# Patient Record
Sex: Male | Born: 1985 | Race: Black or African American | Hispanic: No | Marital: Single | Smoking: Current every day smoker
Health system: Southern US, Community
[De-identification: ages and names within clinical notes are randomized; demographics above are authoritative.]

## PROBLEM LIST (undated history)

## (undated) DIAGNOSIS — I38 Endocarditis, valve unspecified: Secondary | ICD-10-CM

## (undated) DIAGNOSIS — F1110 Opioid abuse, uncomplicated: Secondary | ICD-10-CM

## (undated) DIAGNOSIS — F32A Depression, unspecified: Secondary | ICD-10-CM

## (undated) DIAGNOSIS — F419 Anxiety disorder, unspecified: Secondary | ICD-10-CM

## (undated) DIAGNOSIS — F319 Bipolar disorder, unspecified: Secondary | ICD-10-CM

## (undated) DIAGNOSIS — J45909 Unspecified asthma, uncomplicated: Secondary | ICD-10-CM

## (undated) DIAGNOSIS — F1410 Cocaine abuse, uncomplicated: Secondary | ICD-10-CM

## (undated) HISTORY — PX: NO PAST SURGERIES: SHX2092

---

## 2006-01-19 ENCOUNTER — Emergency Department (HOSPITAL_COMMUNITY): Admission: EM | Admit: 2006-01-19 | Discharge: 2006-01-19 | Payer: Self-pay | Admitting: Emergency Medicine

## 2006-06-06 ENCOUNTER — Emergency Department (HOSPITAL_COMMUNITY): Admission: EM | Admit: 2006-06-06 | Discharge: 2006-06-06 | Payer: Self-pay | Admitting: Emergency Medicine

## 2009-11-26 ENCOUNTER — Emergency Department (HOSPITAL_COMMUNITY): Admission: EM | Admit: 2009-11-26 | Discharge: 2009-11-26 | Payer: Self-pay | Admitting: Emergency Medicine

## 2019-02-13 ENCOUNTER — Encounter (HOSPITAL_COMMUNITY): Payer: Self-pay | Admitting: Emergency Medicine

## 2019-02-13 ENCOUNTER — Other Ambulatory Visit: Payer: Self-pay

## 2019-02-13 ENCOUNTER — Emergency Department (HOSPITAL_COMMUNITY)
Admission: EM | Admit: 2019-02-13 | Discharge: 2019-02-13 | Disposition: A | Discharge status: Home / Self Care | Payer: Self-pay | Attending: Emergency Medicine | Admitting: Emergency Medicine

## 2019-02-13 ENCOUNTER — Emergency Department (HOSPITAL_COMMUNITY): Payer: Self-pay

## 2019-02-13 DIAGNOSIS — R0602 Shortness of breath: Secondary | ICD-10-CM | POA: Insufficient documentation

## 2019-02-13 DIAGNOSIS — J45909 Unspecified asthma, uncomplicated: Secondary | ICD-10-CM | POA: Insufficient documentation

## 2019-02-13 HISTORY — DX: Unspecified asthma, uncomplicated: J45.909

## 2019-02-13 MED ORDER — ALBUTEROL SULFATE (2.5 MG/3ML) 0.083% IN NEBU
5.0000 mg | INHALATION_SOLUTION | Freq: Once | RESPIRATORY_TRACT | Status: AC
  Administered 2019-02-13: 5 mg via RESPIRATORY_TRACT
  Filled 2019-02-13: qty 6

## 2019-02-13 NOTE — ED Notes (Signed)
Pt on oxygen for comfort.  Without oxygen saturation 100% RA

## 2019-02-13 NOTE — ED Provider Notes (Signed)
Emergency Department Provider Note   I have reviewed the triage vital signs and the nursing notes.   HISTORY  Chief Complaint Shortness of Breath   HPI Nathaniel Hansen is a 33 y.o. male with reported PMH of asthma presents to the emergency department with shortness of breath.  He denies any fevers, chills, cough, travel history.  No sick contacts.  Patient tells me he has asthma but does not use nebulizer treatments or inhaler.  He denies any chest pain.  Shortness of breath symptoms began this morning.  He denies any anxiety symptoms.  No radiation of symptoms or other modifying factors.  Past Medical History:  Diagnosis Date  . Asthma     There are no active problems to display for this patient.   History reviewed. No pertinent surgical history.    Allergies Patient has no known allergies.  History reviewed. No pertinent family history.  Social History Social History   Tobacco Use  . Smoking status: Never Smoker  . Smokeless tobacco: Never Used  Substance Use Topics  . Alcohol use: Yes    Frequency: Never  . Drug use: Never    Review of Systems  Constitutional: No fever/chills Eyes: No visual changes. ENT: No sore throat. Cardiovascular: Denies chest pain. Respiratory: Positive shortness of breath. Gastrointestinal: No abdominal pain.  No nausea, no vomiting.  No diarrhea.  No constipation. Genitourinary: Negative for dysuria. Musculoskeletal: Negative for back pain. Skin: Negative for rash. Neurological: Negative for headaches, focal weakness or numbness.  10-point ROS otherwise negative.  ____________________________________________   PHYSICAL EXAM:  VITAL SIGNS: ED Triage Vitals  Enc Vitals Group     BP 02/13/19 2214 (!) 142/89     Pulse Rate 02/13/19 2214 (!) 120     Resp 02/13/19 2214 (!) 25     Temp 02/13/19 2214 98.3 F (36.8 C)     Temp Source 02/13/19 2214 Oral     SpO2 02/13/19 2214 100 %     Weight 02/13/19 2212 170 lb  (77.1 kg)     Height 02/13/19 2212 5\' 11"  (1.803 m)     Pain Score 02/13/19 2211 0   Constitutional: Alert and oriented. Irregular breathing pattern. Will take several exaggerated, deep breaths and then breathe normally.  Eyes: Conjunctivae are normal.  Head: Atraumatic. Nose: No congestion/rhinnorhea. Mouth/Throat: Mucous membranes are moist.  Neck: No stridor. Cardiovascular: Tachycardia. Good peripheral circulation. Grossly normal heart sounds.   Respiratory: Intermittent increased respiratory effort.  No retractions. Lungs CTAB. Gastrointestinal: Soft and nontender. No distention.  Musculoskeletal: No lower extremity tenderness nor edema. No gross deformities of extremities. Neurologic:  Normal speech and language. No gross focal neurologic deficits are appreciated.  Skin:  Skin is warm, dry and intact. No rash noted. Psychiatric: Bizarre affect. Not responding to internal stimuli.  ____________________________________________  EKG   EKG Interpretation  Date/Time:  Wednesday February 13 2019 22:14:47 EDT Ventricular Rate:  113 PR Interval:    QRS Duration: 101 QT Interval:  334 QTC Calculation: 462 R Axis:   -117 Text Interpretation:  Sinus tachycardia Biatrial enlargement Right superior axis Probable right ventricular hypertrophy No STEMI.  Confirmed by Alona Bene [phone]) on 02/13/2019 10:41:34 PM      ____________________________________________  RADIOLOGY  Patient declined ____________________________________________   PROCEDURES  Procedure(s) performed:   Procedures  None  ____________________________________________   INITIAL IMPRESSION / ASSESSMENT AND PLAN / ED COURSE  Pertinent labs & imaging results that were available during my care of the patient  were reviewed by me and considered in my medical decision making (see chart for details).  Patient arrives to the emergency department with a somewhat bizarre presentation.  He has strange affect.  He does  not appear acutely psychotic or aggressive.  He has intermittent deep, labored breathing but will then return to normal breathing to provide his history.  He then returns to deep breathing.  I did not appreciate any wheezing.  He reports a history of asthma but then later denies any history of asthma.  He is refusing chest x-ray.  We were able to obtain an EKG which shows sinus tachycardia.  He refuses any blood tests.  He denies any chest pain.  My suspicion for PE is low.  No ischemic changes on EKG.  Patient agrees plan for nebulizer treatment and reassess.  10:30 PM  Patient is asking to leave.  States he feels better.  He received only partial breathing treatment.  He is having even, unlabored respirations.  Normal vital signs including oxygen saturation.  He is standing at the nursing station and signing to leave.  Discussed return precautions to the emergency department there.  Patient leaving the ED with normal gait and unlabored respirations.   ____________________________________________  FINAL CLINICAL IMPRESSION(S) / ED DIAGNOSES  Final diagnoses:  SOB (shortness of breath)    MEDICATIONS GIVEN DURING THIS VISIT:  Medications  albuterol (PROVENTIL) (2.5 MG/3ML) 0.083% nebulizer solution 5 mg (5 mg Nebulization Given 02/13/19 2222)    Note:  This document was prepared using Dragon voice recognition software and may include unintentional dictation errors.  Alona Bene, MD Emergency Medicine    Long, Arlyss Repress, MD 02/13/19 [phone]

## 2019-02-13 NOTE — ED Notes (Signed)
Pt refusing to wear oxygen or surgical masks.

## 2019-02-13 NOTE — ED Notes (Signed)
Pt pulled everything off and said that he that he leaving. RN explained that the MD has not discharged him. He stated that all he needed was a breathing treatment and that he is now good to go.

## 2019-02-13 NOTE — Discharge Instructions (Signed)
You are leaving against medical advice. You are welcome to return at any time if you would like evaluation or treatment.

## 2019-02-13 NOTE — ED Triage Notes (Signed)
Patient is asthmatic having shortness of breath since this am, not taking nebulizer treatments or inhaler.

## 2019-02-14 ENCOUNTER — Encounter (HOSPITAL_COMMUNITY): Payer: Self-pay

## 2019-02-14 ENCOUNTER — Emergency Department (HOSPITAL_COMMUNITY)
Admission: EM | Admit: 2019-02-14 | Discharge: 2019-02-14 | Disposition: A | Discharge status: Home / Self Care | Payer: Self-pay | Attending: Emergency Medicine | Admitting: Emergency Medicine

## 2019-02-14 ENCOUNTER — Other Ambulatory Visit: Payer: Self-pay

## 2019-02-14 DIAGNOSIS — F1010 Alcohol abuse, uncomplicated: Secondary | ICD-10-CM | POA: Insufficient documentation

## 2019-02-14 DIAGNOSIS — R0602 Shortness of breath: Secondary | ICD-10-CM | POA: Insufficient documentation

## 2019-02-14 DIAGNOSIS — J45909 Unspecified asthma, uncomplicated: Secondary | ICD-10-CM | POA: Insufficient documentation

## 2019-02-14 MED ORDER — CLONAZEPAM 0.5 MG PO TABS
0.5000 mg | ORAL_TABLET | Freq: Once | ORAL | Status: AC
  Administered 2019-02-14: 0.5 mg via ORAL
  Filled 2019-02-14: qty 1

## 2019-02-14 MED ORDER — ALBUTEROL SULFATE (2.5 MG/3ML) 0.083% IN NEBU
5.0000 mg | INHALATION_SOLUTION | Freq: Once | RESPIRATORY_TRACT | Status: AC
  Administered 2019-02-14: 5 mg via RESPIRATORY_TRACT
  Filled 2019-02-14: qty 6

## 2019-02-14 NOTE — ED Notes (Addendum)
Pt asked this nurse, "hey do you got a "roxy", get me a "roxy". This nurse replied no and I will not.  Dr Lynelle Doctor made aware of this.

## 2019-02-14 NOTE — ED Provider Notes (Signed)
[facility] EMERGENCY DEPARTMENT Provider Note   CSN: [id number] Arrival date & time: 02/14/19  0155  Time seen 2:05 AM  History   Chief Complaint Chief Complaint  Patient presents with  . Shortness of Breath    HPI Nathaniel Hansen is a 33 y.o. male.     HPI patient presents via EMS after leaving the ED about 10:30 PM.  Patient states Nathaniel Hansen has had trouble breathing since Nathaniel Hansen was a child.  Initially told me Nathaniel Hansen never been to the ED for his breathing then Nathaniel Hansen told me Nathaniel Hansen had.  Nathaniel Hansen states Nathaniel Hansen is never had to be admitted and Nathaniel Hansen did not know what steroids were.  Nathaniel Hansen states yesterday morning and then this morning Nathaniel Hansen is having a lot of trouble breathing.  Nathaniel Hansen denies cough, sore throat, rhinorrhea, sneezing, fever, vomiting, or diarrhea.  Nathaniel Hansen states the last episode prior to tonight was a year and a half ago.  Nathaniel Hansen states nothing Nathaniel Hansen is does makes the breathing difficulty worse, but drinking a lot of water makes it get better.  Nathaniel Hansen states "water has a lot of oxygen in it".  Nathaniel Hansen denies palpitations or feeling his heart is beating hard or racing.  Patient was seen earlier this afternoon and left after getting one nebulizer treatment.   PCP Patient, No Pcp Per   Past Medical History:  Diagnosis Date  . Asthma     There are no active problems to display for this patient.   History reviewed. No pertinent surgical history.      Home Medications    Prior to Admission medications   Not on File    Family History No family history on file.  Social History Social History   Tobacco Use  . Smoking status: Never Smoker  . Smokeless tobacco: Never Used  Substance Use Topics  . Alcohol use: Yes    Frequency: Never  . Drug use: Never  unemployed States Nathaniel Hansen is living friend to friend Smokes 1 ppd Denies drug use States drinks "occasionally".   Allergies   Patient has no known allergies.   Review of Systems Review of Systems  All other systems reviewed and are negative.     Physical Exam Updated Vital Signs BP 130/74   Pulse (!) 115   Temp 98.3 F (36.8 C) (Oral)   Resp (!) 33   Ht 5\' 11"  (1.803 m)   Wt 77.1 kg   SpO2 100%   BMI 23.71 kg/m   Physical Exam Vitals signs and nursing note reviewed.  Constitutional:      General: Nathaniel Hansen is not in acute distress.    Appearance: Nathaniel Hansen is well-developed.  HENT:     Head: Normocephalic and atraumatic.     Right Ear: External ear normal.     Left Ear: External ear normal.     Nose: Nose normal.     Mouth/Throat:     Mouth: Mucous membranes are moist.     Pharynx: No pharyngeal swelling, oropharyngeal exudate or posterior oropharyngeal erythema.  Eyes:     Extraocular Movements: Extraocular movements intact.     Conjunctiva/sclera: Conjunctivae normal.     Pupils: Pupils are equal, round, and reactive to light.  Neck:     Musculoskeletal: Normal range of motion.  Cardiovascular:     Rate and Rhythm: Regular rhythm. Tachycardia present.     Pulses: Normal pulses.  Pulmonary:     Effort: Tachypnea present.     Breath sounds: Normal breath sounds. No  decreased breath sounds, wheezing or rhonchi.     Comments: I had patient sit up to listen to his lungs and Nathaniel Hansen flopped back down on the bed before I was done.  When I asked him what happened Nathaniel Hansen states "I am tired". Musculoskeletal: Normal range of motion.  Skin:    General: Skin is warm and dry.  Neurological:     General: No focal deficit present.     Mental Status: Nathaniel Hansen is alert and oriented to person, place, and time.     Cranial Nerves: No cranial nerve deficit.  Psychiatric:        Mood and Affect: Mood is anxious.        Speech: Speech is rapid and pressured.        Behavior: Behavior is agitated.      ED Treatments / Results  Labs (all labs ordered are listed, but only abnormal results are displayed) Labs Reviewed - No data to display  EKG None  Radiology No results found.  Procedures Procedures (including critical care time)   Medications Ordered in ED Medications  albuterol (PROVENTIL) (2.5 MG/3ML) 0.083% nebulizer solution 5 mg (5 mg Nebulization Given 02/14/19 0226)  clonazePAM (KLONOPIN) tablet 0.5 mg (0.5 mg Oral Given 02/14/19 0248)     Initial Impression / Assessment and Plan / ED Course  I have reviewed the triage vital signs and the nursing notes.  Pertinent labs & imaging results that were available during my care of the patient were reviewed by me and considered in my medical decision making (see chart for details).    Patient was given an albuterol nebulizer only because Nathaniel Hansen requested it.  Patient is refusing to get a chest x-ray, Nathaniel Hansen will not say why.  Nurse reports his mother called and states Nathaniel Hansen has been living with them for the past 2 years.  She reports Nathaniel Hansen drinks a lot of alcohol daily and Nathaniel Hansen has been trying to detox himself without going to rehab.  This may explain some of his anxiety.  CIWA score was done by nursing staff and is 7 so Nathaniel Hansen is having mild withdrawal symptoms.  Nurse reports patient is refusing to take Ativan but states Nathaniel Hansen will take Klonopin.  Nathaniel Hansen states Nathaniel Hansen has had it before but will not say why except Nathaniel Hansen has some anxiety.  I am wondering if Nathaniel Hansen is having a flareup of his anxiety with the current coronavirus scare.    Recheck at 3:20 AM patient has had his nebulizer.  Nathaniel Hansen states Nathaniel Hansen is feeling better.  His affect is just not right.  I still think Nathaniel Hansen may be doing some type of street drug.  Nathaniel Hansen also is withdrawing from alcohol but Nathaniel Hansen just does not having a normal affect.  Nathaniel Hansen is unemployed and living with his mother or "friend a friend".  Nathaniel Hansen may have some underlying mental health issues.  Nathaniel Hansen however is not expressing suicidal or homicidal ideation.  Patient was not given an albuterol inhaler, I am afraid Nathaniel Hansen will miss use it.  Final Clinical Impressions(s) / ED Diagnoses   Final diagnoses:  Shortness of breath  Alcohol abuse    ED Discharge Orders    None     Plan discharge  Devoria Albe,  MD, Concha Pyo, MD 02/14/19 [phone]

## 2019-02-14 NOTE — ED Notes (Signed)
Pt mother called this nurse to inform that pt has been "trying to quit drinking" for the past couple of days by himself without going to rehab, mom expresses concerns about pt possibly having withdrawals Dr Lynelle Doctor made aware of this.

## 2019-02-14 NOTE — Discharge Instructions (Signed)
Your oxygen level is perfect at 100%.  Recheck if you get a fever, cough, or you feel like you are worse again.  Consider going to [facility] to get help with your alcohol problem.  They can also help you with your anxiety.

## 2019-02-14 NOTE — ED Triage Notes (Signed)
Pt brought in by EMS for c/o shortness of breath. Pt was seen earlier for the same and given a neb treatment, Pt wanted to leave afterwards and the EDP was notified, xray that was ordered was not performed and pt was given discharge papers. Pt here at this time c/o shortness of breath. Pt is 100% on room air, no wheezing, with increased respirations.

## 2019-02-15 ENCOUNTER — Other Ambulatory Visit: Payer: Self-pay

## 2019-02-15 ENCOUNTER — Emergency Department (HOSPITAL_COMMUNITY)
Admission: EM | Admit: 2019-02-15 | Discharge: 2019-02-15 | Disposition: A | Discharge status: Home / Self Care | Payer: Self-pay | Attending: Emergency Medicine | Admitting: Emergency Medicine

## 2019-02-15 ENCOUNTER — Encounter (HOSPITAL_COMMUNITY): Payer: Self-pay | Admitting: *Deleted

## 2019-02-15 DIAGNOSIS — J45901 Unspecified asthma with (acute) exacerbation: Secondary | ICD-10-CM

## 2019-02-15 DIAGNOSIS — J4521 Mild intermittent asthma with (acute) exacerbation: Secondary | ICD-10-CM | POA: Insufficient documentation

## 2019-02-15 MED ORDER — ALBUTEROL SULFATE HFA 108 (90 BASE) MCG/ACT IN AERS
2.0000 | INHALATION_SPRAY | Freq: Once | RESPIRATORY_TRACT | Status: AC
  Administered 2019-02-15: 2 via RESPIRATORY_TRACT
  Filled 2019-02-15: qty 6.7

## 2019-02-15 NOTE — ED Notes (Signed)
Pt escorted out with security and rpd.

## 2019-02-15 NOTE — ED Notes (Signed)
edp in room  

## 2019-02-15 NOTE — ED Notes (Signed)
Pt unwilling to leave. Security and rpd to bedside

## 2019-02-15 NOTE — ED Triage Notes (Signed)
C/o sob that started a few days ago, denies any cough but states " I have some phlegm in my throat", admits to a 'few fevers"  Denies any chills, was seen in er for the past two days for same,

## 2019-02-15 NOTE — ED Notes (Signed)
EDP to room to rediscuss d/c

## 2019-02-15 NOTE — Discharge Instructions (Signed)
Stop smoking.  Use inhaler as directed

## 2019-02-15 NOTE — ED Notes (Signed)
RN went over pt discharge instructions. Pt unwilling to leave at this time wants PA to come check on him again.

## 2019-02-15 NOTE — ED Provider Notes (Signed)
[facility] EMERGENCY DEPARTMENT Provider Note   CSN: [id number] Arrival date & time: 02/15/19  2012    History   Chief Complaint Chief Complaint  Patient presents with  . Shortness of Breath    HPI Nathaniel Hansen is a 33 y.o. male.     The history is provided by the patient. No language interpreter was used.  Shortness of Breath  Severity:  Mild Onset quality:  Gradual Timing:  Constant Progression:  Unchanged Chronicity:  Recurrent Relieved by:  Nothing Worsened by:  Nothing Ineffective treatments:  None tried Associated symptoms: cough   Risk factors: tobacco use   Pt reports he feels short of breath on and off. Pt has been here the last 2 days.   Past Medical History:  Diagnosis Date  . Asthma     There are no active problems to display for this patient.   History reviewed. No pertinent surgical history.      Home Medications    Prior to Admission medications   Not on File    Family History No family history on file.  Social History Social History   Tobacco Use  . Smoking status: Never Smoker  . Smokeless tobacco: Never Used  Substance Use Topics  . Alcohol use: Yes    Frequency: Never  . Drug use: Never     Allergies   Patient has no known allergies.   Review of Systems Review of Systems  Respiratory: Positive for cough and shortness of breath.   All other systems reviewed and are negative.    Physical Exam Updated Vital Signs BP 111/68 (BP Location: Right Arm)   Pulse (!) 120   Temp 98.3 F (36.8 C) (Oral)   Resp 14   SpO2 98%   Physical Exam Vitals signs and nursing note reviewed.  Constitutional:      Appearance: He is well-developed.  HENT:     Head: Normocephalic and atraumatic.  Eyes:     Conjunctiva/sclera: Conjunctivae normal.  Neck:     Musculoskeletal: Neck supple.  Cardiovascular:     Rate and Rhythm: Normal rate and regular rhythm.     Heart sounds: No murmur.  Pulmonary:     Effort:  Pulmonary effort is normal. No respiratory distress.     Breath sounds: Examination of the right-lower field reveals wheezing. Examination of the left-lower field reveals wheezing. Wheezing present.  Chest:     Chest wall: No tenderness.  Abdominal:     Palpations: Abdomen is soft.     Tenderness: There is no abdominal tenderness.  Skin:    General: Skin is warm and dry.  Neurological:     General: No focal deficit present.     Mental Status: He is alert.  Psychiatric:        Mood and Affect: Mood normal.      ED Treatments / Results  Labs (all labs ordered are listed, but only abnormal results are displayed) Labs Reviewed - No data to display  EKG EKG Interpretation  Date/Time:  Friday February 15 2019 20:26:16 EDT Ventricular Rate:  106 PR Interval:    QRS Duration: 103 QT Interval:  339 QTC Calculation: 451 R Axis:   92 Text Interpretation:  Sinus tachycardia Biatrial enlargement Consider right ventricular hypertrophy Confirmed by Vanetta Mulders [phone]) on 02/15/2019 8:47:38 PM   Radiology No results found.  Procedures Procedures (including critical care time)  Medications Ordered in ED Medications  albuterol (PROVENTIL HFA;VENTOLIN HFA) 108 (90 Base) MCG/ACT inhaler  2 puff (2 puffs Inhalation Given 02/15/19 2134)     Initial Impression / Assessment and Plan / ED Course  I have reviewed the triage vital signs and the nursing notes.  Pertinent labs & imaging results that were available during my care of the patient were reviewed by me and considered in my medical decision making (see chart for details).        MDM  RN attempted to give 2 puffs albuterol. Pt stated he could not use.  Pt refused blood work.  Pt refused chest xray.   I reexamined pt due to this being his 3rd visit.  Pt seems well.  I advised pt to stay home.  He states his Mother keeps dropping him off at the hospital. He states his Mother does not want him at home if he is sick.   Final  Clinical Impressions(s) / ED Diagnoses   Final diagnoses:  Mild asthma with exacerbation, unspecified whether persistent    ED Discharge Orders    None    Albuterol inhaler   Elson Areas, PA-C 02/15/19 2255    Vanetta Mulders, MD 02/27/19 [phone]

## 2019-06-06 ENCOUNTER — Emergency Department (HOSPITAL_COMMUNITY)
Admission: EM | Admit: 2019-06-06 | Discharge: 2019-06-06 | Payer: Self-pay | Attending: Emergency Medicine | Admitting: Emergency Medicine

## 2019-06-06 ENCOUNTER — Encounter (HOSPITAL_COMMUNITY): Payer: Self-pay

## 2019-06-06 ENCOUNTER — Other Ambulatory Visit: Payer: Self-pay

## 2019-06-06 DIAGNOSIS — F17210 Nicotine dependence, cigarettes, uncomplicated: Secondary | ICD-10-CM | POA: Insufficient documentation

## 2019-06-06 DIAGNOSIS — Z1389 Encounter for screening for other disorder: Secondary | ICD-10-CM | POA: Insufficient documentation

## 2019-06-06 DIAGNOSIS — J45909 Unspecified asthma, uncomplicated: Secondary | ICD-10-CM | POA: Insufficient documentation

## 2019-06-06 LAB — RAPID HIV SCREEN (HIV 1/2 AB+AG)
HIV 1/2 Antibodies: NONREACTIVE
HIV-1 P24 Antigen - HIV24: NONREACTIVE

## 2019-06-06 NOTE — ED Provider Notes (Signed)
[facility] EMERGENCY DEPARTMENT Provider Note   CSN: [id number] Arrival date & time: 06/06/19  0431    History   Chief Complaint Chief Complaint  Patient presents with  . Labs Only    HPI NEO YEPIZ is a 33 y.o. male.     Patient presents to the emergency department in custody of police.  One of the officers that was involved in his apprehension earlier was accidentally stuck with a needle that was in the patient's presence.  Patient denies history of HIV and hepatitis.  He has brought here by police for post exposure lab draw.  Patient reports that he gives consent for blood testing for communicable diseases.     Past Medical History:  Diagnosis Date  . Asthma     There are no active problems to display for this patient.   History reviewed. No pertinent surgical history.      Home Medications    Prior to Admission medications   Not on File    Family History No family history on file.  Social History Social History   Tobacco Use  . Smoking status: Current Every Day Smoker    Packs/day: 1.00    Types: Cigarettes  . Smokeless tobacco: Never Used  Substance Use Topics  . Alcohol use: Yes    Frequency: Never  . Drug use: Never    Comment: "sometimes"     Allergies   Patient has no known allergies.   Review of Systems Review of Systems  Constitutional: Negative for fever.  Skin: Negative for rash.     Physical Exam Updated Vital Signs BP 140/75 (BP Location: Right Arm)   Pulse 90   Temp 98.4 F (36.9 C) (Oral)   Resp 18   Ht 6' (1.829 m)   Wt 77.1 kg   SpO2 100%   BMI 23.06 kg/m   Physical Exam Constitutional:      Appearance: Normal appearance.  Pulmonary:     Effort: Pulmonary effort is normal.  Musculoskeletal: Normal range of motion.  Skin:    General: Skin is warm and dry.     Findings: No rash.  Neurological:     General: No focal deficit present.     Mental Status: He is alert and oriented to person, place,  and time.  Psychiatric:        Mood and Affect: Mood normal.        Behavior: Behavior normal.      ED Treatments / Results  Labs (all labs ordered are listed, but only abnormal results are displayed) Labs Reviewed  RAPID HIV SCREEN (HIV 1/2 AB+AG)  HEPATITIS PANEL, ACUTE    EKG None  Radiology No results found.  Procedures Procedures (including critical care time)  Medications Ordered in ED Medications - No data to display   Initial Impression / Assessment and Plan / ED Course  I have reviewed the triage vital signs and the nursing notes.  Pertinent labs & imaging results that were available during my care of the patient were reviewed by me and considered in my medical decision making (see chart for details).        Patient brought to the emergency department by police for blood draw.  Patient was apprehended earlier tonight and during a search, 1 of the officers accidentally was stuck on his right pinky finger by a used needle that was in the possession of the patient.  Patient denies any history of HIV or hepatitis.  He consented  for blood draw.  Postexposure panel was drawn.  Final Clinical Impressions(s) / ED Diagnoses   Final diagnoses:  Encounter for screening for other disorder    ED Discharge Orders    None       Maylyn Narvaiz, Canary Brimhristopher J, MD 06/06/19 [phone]

## 2019-06-06 NOTE — ED Triage Notes (Signed)
[employer] brought pt into ED in custody to have blood drawn as officer was exposed to needle that pt had used.

## 2019-06-07 LAB — HEPATITIS PANEL, ACUTE
HCV Ab: <0.1 s/co ratio (ref 0.0–0.9)
Hep A IgM: NEGATIVE
Hep B C IgM: NEGATIVE
Hepatitis B Surface Ag: NEGATIVE

## 2019-06-19 ENCOUNTER — Other Ambulatory Visit: Payer: Self-pay

## 2019-06-19 ENCOUNTER — Emergency Department (HOSPITAL_COMMUNITY): Payer: Self-pay

## 2019-06-19 ENCOUNTER — Encounter (HOSPITAL_COMMUNITY): Payer: Self-pay | Admitting: Emergency Medicine

## 2019-06-19 ENCOUNTER — Emergency Department (HOSPITAL_COMMUNITY)
Admission: EM | Admit: 2019-06-19 | Discharge: 2019-06-19 | Disposition: A | Discharge status: Home / Self Care | Payer: Self-pay | Attending: Emergency Medicine | Admitting: Emergency Medicine

## 2019-06-19 DIAGNOSIS — R55 Syncope and collapse: Secondary | ICD-10-CM | POA: Insufficient documentation

## 2019-06-19 DIAGNOSIS — F1910 Other psychoactive substance abuse, uncomplicated: Secondary | ICD-10-CM | POA: Insufficient documentation

## 2019-06-19 DIAGNOSIS — J45909 Unspecified asthma, uncomplicated: Secondary | ICD-10-CM | POA: Insufficient documentation

## 2019-06-19 DIAGNOSIS — R0602 Shortness of breath: Secondary | ICD-10-CM | POA: Insufficient documentation

## 2019-06-19 DIAGNOSIS — F17210 Nicotine dependence, cigarettes, uncomplicated: Secondary | ICD-10-CM | POA: Insufficient documentation

## 2019-06-19 LAB — CBC WITH DIFFERENTIAL/PLATELET
Abs Immature Granulocytes: 0.01 10*3/uL (ref 0.00–0.07)
Basophils Absolute: 0 10*3/uL (ref 0.0–0.1)
Basophils Relative: 1 %
Eosinophils Absolute: 0 10*3/uL (ref 0.0–0.5)
Eosinophils Relative: 1 %
HCT: 32.4 % — ABNORMAL LOW (ref 39.0–52.0)
Hemoglobin: 10.4 g/dL — ABNORMAL LOW (ref 13.0–17.0)
Immature Granulocytes: 0 %
Lymphocytes Relative: 28 %
Lymphs Abs: 0.9 10*3/uL (ref 0.7–4.0)
MCH: 28 pg (ref 26.0–34.0)
MCHC: 32.1 g/dL (ref 30.0–36.0)
MCV: 87.3 fL (ref 80.0–100.0)
Monocytes Absolute: 0.5 10*3/uL (ref 0.1–1.0)
Monocytes Relative: 16 %
Neutro Abs: 1.7 10*3/uL (ref 1.7–7.7)
Neutrophils Relative %: 54 %
Platelets: 247 10*3/uL (ref 150–400)
RBC: 3.71 MIL/uL — ABNORMAL LOW (ref 4.22–5.81)
RDW: 13.6 % (ref 11.5–15.5)
WBC: 3.2 10*3/uL — ABNORMAL LOW (ref 4.0–10.5)
nRBC: 0 % (ref 0.0–0.2)

## 2019-06-19 LAB — RAPID URINE DRUG SCREEN, HOSP PERFORMED
Amphetamines: POSITIVE — AB
Barbiturates: NOT DETECTED
Benzodiazepines: POSITIVE — AB
Cocaine: NOT DETECTED
Opiates: NOT DETECTED
Tetrahydrocannabinol: POSITIVE — AB

## 2019-06-19 LAB — TROPONIN I (HIGH SENSITIVITY)
Troponin I (High Sensitivity): <2 ng/L (ref <18)
Troponin I (High Sensitivity): <2 ng/L (ref <18)

## 2019-06-19 LAB — URINALYSIS, ROUTINE W REFLEX MICROSCOPIC
Bilirubin Urine: NEGATIVE
Glucose, UA: NEGATIVE mg/dL
Ketones, ur: NEGATIVE mg/dL
Leukocytes,Ua: NEGATIVE
Nitrite: NEGATIVE
Protein, ur: NEGATIVE mg/dL
Specific Gravity, Urine: 1.016 (ref 1.005–1.030)
pH: 5 (ref 5.0–8.0)

## 2019-06-19 LAB — HEPATIC FUNCTION PANEL
ALT: 15 U/L (ref 0–44)
AST: 19 U/L (ref 15–41)
Albumin: 4.1 g/dL (ref 3.5–5.0)
Alkaline Phosphatase: 55 U/L (ref 38–126)
Bilirubin, Direct: <0.1 mg/dL (ref 0.0–0.2)
Total Bilirubin: 0.4 mg/dL (ref 0.3–1.2)
Total Protein: 7.9 g/dL (ref 6.5–8.1)

## 2019-06-19 LAB — BASIC METABOLIC PANEL
Anion gap: 10 (ref 5–15)
BUN: 15 mg/dL (ref 6–20)
CO2: 25 mmol/L (ref 22–32)
Calcium: 9.2 mg/dL (ref 8.9–10.3)
Chloride: 103 mmol/L (ref 98–111)
Creatinine, Ser: 0.99 mg/dL (ref 0.61–1.24)
GFR calc Af Amer: >60 mL/min (ref >60)
GFR calc non Af Amer: >60 mL/min (ref >60)
Glucose, Bld: 104 mg/dL — ABNORMAL HIGH (ref 70–99)
Potassium: 3.6 mmol/L (ref 3.5–5.1)
Sodium: 138 mmol/L (ref 135–145)

## 2019-06-19 LAB — AMMONIA: Ammonia: 35 umol/L (ref 9–35)

## 2019-06-19 LAB — D-DIMER, QUANTITATIVE: D-Dimer, Quant: 0.52 ug/mL-FEU — ABNORMAL HIGH (ref 0.00–0.50)

## 2019-06-19 LAB — ETHANOL: Alcohol, Ethyl (B): <10 mg/dL (ref <10)

## 2019-06-19 MED ORDER — IOHEXOL 350 MG/ML SOLN
100.0000 mL | Freq: Once | INTRAVENOUS | Status: AC | PRN
  Administered 2019-06-19: 100 mL via INTRAVENOUS

## 2019-06-19 MED ORDER — SODIUM CHLORIDE 0.9 % IV BOLUS
1000.0000 mL | Freq: Once | INTRAVENOUS | Status: AC
  Administered 2019-06-19: 1000 mL via INTRAVENOUS

## 2019-06-19 NOTE — ED Provider Notes (Signed)
[facility] EMERGENCY DEPARTMENT Provider Note   CSN: [id number] Arrival date & time: 06/19/19  0505     History   Chief Complaint Chief Complaint  Patient presents with  . Shortness of Breath    HPI Nathaniel Hansen is a 33 y.o. male.     Level 5 caveat for uncooperative.  Patient brought in by EMS for reported shortness of breath and lightheadedness.  Patient remains asleep but is not very cooperative with answering questions.  He does wake up with encouragement thought and Patient is alert and oriented to person and place.  Denies using any drugs tonight.  States he was sitting on his couch smoking when all of a sudden he became short of breath and lightheaded.  He states he felt like he was going to pass out but did not actually.  His chest felt tight.  There is no cough or fever.  He did not fall or hit his head.  Admits to smoking cigarettes but no other drugs.  He is concerned he could be dehydrated and is felt lightheaded but did not actually pass out or hit his head.  He complains of gradual onset headache, chest tightness and trouble breathing only saturating 100% on room air.  No known coronavirus or other ill exposures. Denies thunderclap onset headache.  The history is provided by the patient and the EMS personnel. The history is limited by the condition of the patient.  Shortness of Breath Associated symptoms: no abdominal pain, no chest pain, no fever, no headaches, no rash and no vomiting     Past Medical History:  Diagnosis Date  . Asthma     There are no active problems to display for this patient.   History reviewed. No pertinent surgical history.      Home Medications    Prior to Admission medications   Not on File    Family History History reviewed. No pertinent family history.  Social History Social History   Tobacco Use  . Smoking status: Current Every Day Smoker    Packs/day: 1.00    Types: Cigarettes  . Smokeless tobacco: Never Used   Substance Use Topics  . Alcohol use: Yes    Frequency: Never  . Drug use: Never    Comment: "sometimes"     Allergies   Patient has no known allergies.   Review of Systems Review of Systems  Constitutional: Negative for activity change, appetite change and fever.  HENT: Negative for congestion and rhinorrhea.   Respiratory: Positive for chest tightness and shortness of breath.   Cardiovascular: Negative for chest pain.  Gastrointestinal: Negative for abdominal pain, nausea and vomiting.  Genitourinary: Negative for dysuria and hematuria.  Musculoskeletal: Negative for back pain and myalgias.  Skin: Negative for rash.  Neurological: Negative for dizziness, weakness and headaches.    all other systems are negative except as noted in the HPI and PMH.    Physical Exam Updated Vital Signs BP 124/87 (BP Location: Left Arm)   Pulse 78   Temp 98.1 F (36.7 C) (Oral)   Resp 16   Ht 6' (1.829 m)   Wt 77.1 kg   SpO2 99%   BMI 23.06 kg/m   Physical Exam Vitals signs and nursing note reviewed.  Constitutional:      General: He is not in acute distress.    Appearance: He is well-developed.     Comments: Somnolent, arouses to voice, smells of tobacco  HENT:     Head:  Normocephalic and atraumatic.     Mouth/Throat:     Pharynx: No oropharyngeal exudate.  Eyes:     Conjunctiva/sclera: Conjunctivae normal.     Pupils: Pupils are equal, round, and reactive to light.  Neck:     Musculoskeletal: Normal range of motion and neck supple.     Comments: No meningismus. Cardiovascular:     Rate and Rhythm: Normal rate and regular rhythm.     Heart sounds: Normal heart sounds. No murmur.  Pulmonary:     Effort: Pulmonary effort is normal. No respiratory distress.     Breath sounds: Normal breath sounds. No wheezing.  Chest:     Chest wall: No tenderness.  Abdominal:     Palpations: Abdomen is soft.     Tenderness: There is no abdominal tenderness. There is no guarding or  rebound.  Musculoskeletal: Normal range of motion.        General: No tenderness.  Skin:    General: Skin is warm.     Capillary Refill: Capillary refill takes less than 2 seconds.  Neurological:     General: No focal deficit present.     Mental Status: He is alert and oriented to person, place, and time. Mental status is at baseline.     Cranial Nerves: No cranial nerve deficit.     Motor: No abnormal muscle tone.     Coordination: Coordination normal.     Comments: No ataxia on finger to nose bilaterally. No pronator drift. 5/5 strength throughout. CN 2-12 intact.Equal grip strength. Sensation intact.   Psychiatric:        Behavior: Behavior normal.      ED Treatments / Results  Labs (all labs ordered are listed, but only abnormal results are displayed) Labs Reviewed  CBC WITH DIFFERENTIAL/PLATELET - Abnormal; Notable for the following components:      Result Value   WBC 3.2 (*)    RBC 3.71 (*)    Hemoglobin 10.4 (*)    HCT 32.4 (*)    All other components within normal limits  BASIC METABOLIC PANEL - Abnormal; Notable for the following components:   Glucose, Bld 104 (*)    All other components within normal limits  D-DIMER, QUANTITATIVE (NOT AT [facility]) - Abnormal; Notable for the following components:   D-Dimer, Quant 0.52 (*)    All other components within normal limits  ETHANOL  HEPATIC FUNCTION PANEL  AMMONIA  TROPONIN I (HIGH SENSITIVITY)  TROPONIN I (HIGH SENSITIVITY)    EKG EKG Interpretation  Date/Time:  Wednesday June 19 2019 05:38:44 EDT Ventricular Rate:  81 PR Interval:    QRS Duration: 104 QT Interval:  398 QTC Calculation: 462 R Axis:   85 Text Interpretation:  Sinus rhythm RSR' in V1 or V2, right VCD or RVH No significant change was found Artifact Confirmed by Ezequiel Essex [phone]) on 06/19/2019 5:55:09 AM   Radiology Ct Angio Chest Pe W And/or Wo Contrast  Result Date: 06/19/2019 CLINICAL DATA:  Lightheadedness, near syncope, dehydration  EXAM: CT ANGIOGRAPHY CHEST WITH CONTRAST TECHNIQUE: Multidetector CT imaging of the chest was performed using the standard protocol during bolus administration of intravenous contrast. Multiplanar CT image reconstructions and MIPs were obtained to evaluate the vascular anatomy. CONTRAST:  133mL OMNIPAQUE IOHEXOL 350 MG/ML SOLN COMPARISON:  None. FINDINGS: Cardiovascular: Heart size normal. No pericardial effusion. Satisfactory opacification of pulmonary arteries noted, and there is no evidence of pulmonary emboli. Adequate contrast opacification of the thoracic aorta with no evidence of dissection, aneurysm,  or stenosis. There is classic 3-vessel brachiocephalic arch anatomy without proximal stenosis. Mediastinum/Nodes: No hilar or mediastinal adenopathy. Lungs/Pleura: No pleural effusion. No pneumothorax. Mild dependent atelectasis posteriorly in both lower lobes. Upper Abdomen: No acute findings. Musculoskeletal: No chest wall abnormality. No acute or significant osseous findings. Review of the MIP images confirms the above findings. IMPRESSION: Negative for acute PE or thoracic aortic dissection. Electronically Signed   By: Corlis Leak  Hassell M.D.   On: 06/19/2019 08:00   Dg Chest Portable 1 View  Result Date: 06/19/2019 CLINICAL DATA:  Syncopal episode.  Shortness of breath. EXAM: PORTABLE CHEST 1 VIEW COMPARISON:  None. FINDINGS: The heart size and mediastinal contours are within normal limits. Both lungs are clear. The visualized skeletal structures are unremarkable. IMPRESSION: Negative one-view chest x-ray Electronically Signed   By: Marin Robertshristopher  Mattern M.D.   On: 06/19/2019 06:22    Procedures Procedures (including critical care time)  Medications Ordered in ED Medications - No data to display   Initial Impression / Assessment and Plan / ED Course  I have reviewed the triage vital signs and the nursing notes.  Pertinent labs & imaging results that were available during my care of the patient were  reviewed by me and considered in my medical decision making (see chart for details).       Episode of shortness of breath with near syncope and lightheadedness.  EKG shows sinus rhythm, no Brugada, no prolonged QT.  Neurological exam mostly nonfocal though patient is not cooperative.  CXR negative. Labs show anemia. No comparison. D-dimer slightly elevated.   Will obtain CT head given AMS and CTPE.  IV given for his orthostasis. Will need to wake up for more history and reassessment.  Dr. Estell HarpinZammit to assume care at shift change. Final Clinical Impressions(s) / ED Diagnoses   Final diagnoses:  None    ED Discharge Orders    None       Jermesha Sottile, Jeannett SeniorStephen, MD 06/19/19 [phone]

## 2019-06-19 NOTE — ED Notes (Signed)
Patient refused to keep BP and SpO2 on.

## 2019-06-19 NOTE — ED Notes (Signed)
Patient wakes up but goes back to sleep stating that he just wants to take a nap.

## 2019-06-19 NOTE — ED Notes (Signed)
Patient will not give urine sample at this time.

## 2019-06-19 NOTE — ED Notes (Signed)
Explained to patient the need for urine sample. Asked if he would like an in and out catheter. Patient said he would try to urinate.

## 2019-06-19 NOTE — ED Notes (Signed)
Patient transported to CT 

## 2019-06-19 NOTE — Discharge Instructions (Signed)
Stop using drugs and follow-up with [facility]

## 2019-06-19 NOTE — ED Triage Notes (Signed)
Pt states he was feeling lightheaded earlier tonight. Pt states he bent over and everything went black. Pt states he "passed out because of dehydration"

## 2019-06-20 ENCOUNTER — Encounter (HOSPITAL_COMMUNITY): Payer: Self-pay | Admitting: *Deleted

## 2019-06-20 ENCOUNTER — Other Ambulatory Visit: Payer: Self-pay

## 2019-06-20 DIAGNOSIS — J45909 Unspecified asthma, uncomplicated: Secondary | ICD-10-CM | POA: Insufficient documentation

## 2019-06-20 DIAGNOSIS — F1290 Cannabis use, unspecified, uncomplicated: Secondary | ICD-10-CM | POA: Insufficient documentation

## 2019-06-20 DIAGNOSIS — R0602 Shortness of breath: Secondary | ICD-10-CM | POA: Insufficient documentation

## 2019-06-20 DIAGNOSIS — F17210 Nicotine dependence, cigarettes, uncomplicated: Secondary | ICD-10-CM | POA: Insufficient documentation

## 2019-06-20 DIAGNOSIS — R0789 Other chest pain: Secondary | ICD-10-CM | POA: Insufficient documentation

## 2019-06-20 NOTE — ED Notes (Signed)
Pt admits to drinking beer tonight, states that it was just a couple, sclera red in both eyes,

## 2019-06-20 NOTE — ED Notes (Deleted)
  June 20, 2019  Patient: Nathaniel Hansen  Date of Birth: May 19, 1986  Date of Visit: 06/19/2019    To Whom It May Concern:  Nathaniel Hansen was seen and treated in our emergency department on 06/19/2019.

## 2019-06-20 NOTE — ED Triage Notes (Signed)
Pt arrived to er by ems with c/o sob and chest pain, pt seen in er yesterday for same, refused to allow ems to give any treatment,

## 2019-06-21 ENCOUNTER — Emergency Department (HOSPITAL_COMMUNITY)
Admission: EM | Admit: 2019-06-21 | Discharge: 2019-06-21 | Disposition: A | Discharge status: Home / Self Care | Payer: Self-pay | Attending: Emergency Medicine | Admitting: Emergency Medicine

## 2019-06-21 ENCOUNTER — Emergency Department (HOSPITAL_COMMUNITY): Payer: Self-pay

## 2019-06-21 DIAGNOSIS — R0789 Other chest pain: Secondary | ICD-10-CM

## 2019-06-21 LAB — CBC WITH DIFFERENTIAL/PLATELET
Abs Immature Granulocytes: 0.01 10*3/uL (ref 0.00–0.07)
Basophils Absolute: 0 10*3/uL (ref 0.0–0.1)
Basophils Relative: 1 %
Eosinophils Absolute: 0.2 10*3/uL (ref 0.0–0.5)
Eosinophils Relative: 4 %
HCT: 34.8 % — ABNORMAL LOW (ref 39.0–52.0)
Hemoglobin: 10.7 g/dL — ABNORMAL LOW (ref 13.0–17.0)
Immature Granulocytes: 0 %
Lymphocytes Relative: 56 %
Lymphs Abs: 2.5 10*3/uL (ref 0.7–4.0)
MCH: 28 pg (ref 26.0–34.0)
MCHC: 30.7 g/dL (ref 30.0–36.0)
MCV: 91.1 fL (ref 80.0–100.0)
Monocytes Absolute: 0.4 10*3/uL (ref 0.1–1.0)
Monocytes Relative: 9 %
Neutro Abs: 1.3 10*3/uL — ABNORMAL LOW (ref 1.7–7.7)
Neutrophils Relative %: 30 %
Platelets: 276 10*3/uL (ref 150–400)
RBC: 3.82 MIL/uL — ABNORMAL LOW (ref 4.22–5.81)
RDW: 13.5 % (ref 11.5–15.5)
WBC: 4.3 10*3/uL (ref 4.0–10.5)
nRBC: 0 % (ref 0.0–0.2)

## 2019-06-21 LAB — BASIC METABOLIC PANEL
Anion gap: 7 (ref 5–15)
BUN: 13 mg/dL (ref 6–20)
CO2: 26 mmol/L (ref 22–32)
Calcium: 8.9 mg/dL (ref 8.9–10.3)
Chloride: 106 mmol/L (ref 98–111)
Creatinine, Ser: 0.94 mg/dL (ref 0.61–1.24)
GFR calc Af Amer: >60 mL/min (ref >60)
GFR calc non Af Amer: >60 mL/min (ref >60)
Glucose, Bld: 89 mg/dL (ref 70–99)
Potassium: 4.3 mmol/L (ref 3.5–5.1)
Sodium: 139 mmol/L (ref 135–145)

## 2019-06-21 LAB — TROPONIN I (HIGH SENSITIVITY)
Troponin I (High Sensitivity): <2 ng/L (ref <18)
Troponin I (High Sensitivity): 2 ng/L (ref <18)

## 2019-06-21 LAB — RAPID URINE DRUG SCREEN, HOSP PERFORMED
Amphetamines: POSITIVE — AB
Barbiturates: NOT DETECTED
Benzodiazepines: POSITIVE — AB
Cocaine: NOT DETECTED
Opiates: NOT DETECTED
Tetrahydrocannabinol: POSITIVE — AB

## 2019-06-21 NOTE — Discharge Instructions (Signed)
There is no evidence of heart attack or blood clot in the lung.  Stop using drugs.  Establish care with a primary doctor.  Return to the ED if your chest pain becomes exertional, associated shortness of breath, nausea, vomiting, sweating or other concerns.

## 2019-06-21 NOTE — ED Provider Notes (Signed)
[facility] EMERGENCY DEPARTMENT Provider Note   CSN: [id number] Arrival date & time: 06/20/19  2207     History   Chief Complaint Chief Complaint  Patient presents with   Shortness of Breath    HPI OBE Nathaniel Hansen is a 33 y.o. male.     Patient presents via EMS with episode of chest pain and shortness of breath that onset today after drinking alcohol.  States he developed some chest pain on the left side of his chest about 9 PM after having 2 beers with his friends.  The pain comes and goes lasting for several seconds at a time.  He is not having the pain currently.  He reports being seen in the ED 2 days ago for similar presentation of chest pain then.  States he always has shortness of breath but the chest pain is new for him.  He denies any cocaine use but admits to alcohol use and marijuana use.  No cardiac problems. no Abdominal pain, nausea or vomiting.  No headache.  No focal weakness, numbness or tingling.  Reports he had chest pain throughout the day yesterday that lasted all day and then resolved this morning before coming back this evening. It has  been intermittent since coming and going lasting for just a few seconds at a time. It is not exertional or pleuritic.   He was seen by myself in this ED 2 days ago and had a negative work-up including EKG, chest x-ray and CTPE study. He apparently left AMA after shift change.   The history is provided by the patient and the EMS personnel.  Shortness of Breath Associated symptoms: cough   Associated symptoms: no abdominal pain, no chest pain, no fever, no headaches and no vomiting     Past Medical History:  Diagnosis Date   Asthma     There are no active problems to display for this patient.   History reviewed. No pertinent surgical history.      Home Medications    Prior to Admission medications   Not on File    Family History No family history on file.  Social History Social History   Tobacco Use    Smoking status: Current Every Day Smoker    Packs/day: 1.00    Types: Cigarettes   Smokeless tobacco: Never Used  Substance Use Topics   Alcohol use: Yes    Frequency: Never   Drug use: Never    Comment: "sometimes"     Allergies   Patient has no known allergies.   Review of Systems Review of Systems  Constitutional: Negative for activity change, appetite change, fatigue and fever.  HENT: Negative for congestion and rhinorrhea.   Respiratory: Positive for cough, chest tightness and shortness of breath.   Cardiovascular: Negative for chest pain, palpitations and leg swelling.  Gastrointestinal: Negative for abdominal pain, nausea and vomiting.  Genitourinary: Negative for dysuria and hematuria.  Musculoskeletal: Negative for arthralgias, gait problem and myalgias.  Neurological: Negative for dizziness, weakness and headaches.   all other systems are negative except as noted in the HPI and PMH.    Physical Exam Updated Vital Signs BP 131/68 (BP Location: Right Arm)    Pulse 91    Temp 98.3 F (36.8 C) (Oral)    Resp 17    Ht 6' (1.829 m)    Wt 77.1 kg    SpO2 99%    BMI 23.06 kg/m   Physical Exam Vitals signs and nursing note reviewed.  Constitutional:      General: He is not in acute distress.    Appearance: He is well-developed.     Comments: Intoxicated, sclera red  HENT:     Head: Normocephalic and atraumatic.     Mouth/Throat:     Pharynx: No oropharyngeal exudate.  Eyes:     Conjunctiva/sclera: Conjunctivae normal.     Pupils: Pupils are equal, round, and reactive to light.  Neck:     Musculoskeletal: Normal range of motion and neck supple.     Comments: No meningismus. Cardiovascular:     Rate and Rhythm: Normal rate and regular rhythm.     Heart sounds: Normal heart sounds. No murmur.  Pulmonary:     Effort: Pulmonary effort is normal. No respiratory distress.     Breath sounds: Normal breath sounds.  Chest:     Chest wall: No tenderness.    Abdominal:     Palpations: Abdomen is soft.     Tenderness: There is no abdominal tenderness. There is no guarding or rebound.  Musculoskeletal: Normal range of motion.        General: No tenderness.  Skin:    General: Skin is warm.  Neurological:     Mental Status: He is alert and oriented to person, place, and time.     Cranial Nerves: No cranial nerve deficit.     Motor: No abnormal muscle tone.     Coordination: Coordination normal.     Comments:  5/5 strength throughout. CN 2-12 intact.Equal grip strength.   Psychiatric:        Behavior: Behavior normal.      ED Treatments / Results  Labs (all labs ordered are listed, but only abnormal results are displayed) Labs Reviewed  RAPID URINE DRUG SCREEN, HOSP PERFORMED - Abnormal; Notable for the following components:      Result Value   Benzodiazepines POSITIVE (*)    Amphetamines POSITIVE (*)    Tetrahydrocannabinol POSITIVE (*)    All other components within normal limits  CBC WITH DIFFERENTIAL/PLATELET - Abnormal; Notable for the following components:   RBC 3.82 (*)    Hemoglobin 10.7 (*)    HCT 34.8 (*)    Neutro Abs 1.3 (*)    All other components within normal limits  BASIC METABOLIC PANEL  TROPONIN I (HIGH SENSITIVITY)    EKG EKG Interpretation  Date/Time:  Thursday June 20 2019 22:17:02 EDT Ventricular Rate:  97 PR Interval:  152 QRS Duration: 94 QT Interval:  332 QTC Calculation: 421 R Axis:   43 Text Interpretation:  Normal sinus rhythm with sinus arrhythmia Incomplete right bundle branch block Borderline ECG No significant change was found Confirmed by Glynn Octaveancour, Landri Dorsainvil [phone]([id number]) on 06/21/2019 1:47:15 AM   Radiology Ct Head Wo Contrast  Result Date: 06/19/2019 CLINICAL DATA:  Pt states he was feeling lightheaded earlier tonight. Pt states he bent over and everything went black. Pt states he "passed out because of dehydration" Pt. Would not hold head still^ EXAM: CT HEAD WITHOUT CONTRAST TECHNIQUE:  Contiguous axial images were obtained from the base of the skull through the vertex without intravenous contrast. COMPARISON:  None. FINDINGS: Brain: No evidence of acute infarction, hemorrhage, hydrocephalus, extra-axial collection or mass lesion/mass effect. Vascular: No hyperdense vessel or unexpected calcification. Skull: Normal. Negative for fracture or focal lesion. Sinuses/Orbits: No acute finding. Other: None IMPRESSION: Negative for bleed or other acute intracranial process. Electronically Signed   By: Corlis Leak  Hassell M.D.   On: 06/19/2019 09:21  Ct Angio Chest Pe W And/or Wo Contrast  Result Date: 06/19/2019 CLINICAL DATA:  Lightheadedness, near syncope, dehydration EXAM: CT ANGIOGRAPHY CHEST WITH CONTRAST TECHNIQUE: Multidetector CT imaging of the chest was performed using the standard protocol during bolus administration of intravenous contrast. Multiplanar CT image reconstructions and MIPs were obtained to evaluate the vascular anatomy. CONTRAST:  100mL OMNIPAQUE IOHEXOL 350 MG/ML SOLN COMPARISON:  None. FINDINGS: Cardiovascular: Heart size normal. No pericardial effusion. Satisfactory opacification of pulmonary arteries noted, and there is no evidence of pulmonary emboli. Adequate contrast opacification of the thoracic aorta with no evidence of dissection, aneurysm, or stenosis. There is classic 3-vessel brachiocephalic arch anatomy without proximal stenosis. Mediastinum/Nodes: No hilar or mediastinal adenopathy. Lungs/Pleura: No pleural effusion. No pneumothorax. Mild dependent atelectasis posteriorly in both lower lobes. Upper Abdomen: No acute findings. Musculoskeletal: No chest wall abnormality. No acute or significant osseous findings. Review of the MIP images confirms the above findings. IMPRESSION: Negative for acute PE or thoracic aortic dissection. Electronically Signed   By: Corlis Leak  Hassell M.D.   On: 06/19/2019 08:00   Dg Chest Portable 1 View  Result Date: 06/19/2019 CLINICAL DATA:  Syncopal  episode.  Shortness of breath. EXAM: PORTABLE CHEST 1 VIEW COMPARISON:  None. FINDINGS: The heart size and mediastinal contours are within normal limits. Both lungs are clear. The visualized skeletal structures are unremarkable. IMPRESSION: Negative one-view chest x-ray Electronically Signed   By: Marin Robertshristopher  Mattern M.D.   On: 06/19/2019 06:22    Procedures Procedures (including critical care time)  Medications Ordered in ED Medications - No data to display   Initial Impression / Assessment and Plan / ED Course  I have reviewed the triage vital signs and the nursing notes.  Pertinent labs & imaging results that were available during my care of the patient were reviewed by me and considered in my medical decision making (see chart for details).       Patient here with intermittent chest pain since 9 PM and lasting for a few seconds at a time.  EKG is nonischemic without acute ST changes.  Work-up a few days ago reviewed with negative chest x-ray, troponins and CT angio chest.  EKG is nonischemic.  Troponin chest x-ray are negative. Drug screen is positive for benzodiazepines, marijuana, amphetamines.  CT PE study yesterday was negative.  Patient resting comfortably throughout ED course and sleeping.  Denies having any chest pain currently.  States he does have shortness of breath that is unchanged.  He is not hypoxic and his lungs are clear.  His chest x-ray is negative.  Troponin negative x2.  Low suspicion for ACS or pulmonary embolism.  Patient will be referred to follow-up with his PCP.  Encouraged to stop using drugs. Return to the ED with exertional chest pain, associated with nausea, vomiting, shortness of breath or diaphoresis or any concerns.  Final Clinical Impressions(s) / ED Diagnoses   Final diagnoses:  Atypical chest pain    ED Discharge Orders    None       Kischa Altice, Jeannett SeniorStephen, MD 06/21/19 0530

## 2019-06-23 ENCOUNTER — Encounter (HOSPITAL_COMMUNITY): Payer: Self-pay

## 2019-06-23 ENCOUNTER — Emergency Department (HOSPITAL_COMMUNITY)
Admission: EM | Admit: 2019-06-23 | Discharge: 2019-06-23 | Disposition: A | Discharge status: Home / Self Care | Payer: Self-pay | Attending: Emergency Medicine | Admitting: Emergency Medicine

## 2019-06-23 ENCOUNTER — Other Ambulatory Visit: Payer: Self-pay

## 2019-06-23 DIAGNOSIS — J45909 Unspecified asthma, uncomplicated: Secondary | ICD-10-CM | POA: Insufficient documentation

## 2019-06-23 DIAGNOSIS — F17210 Nicotine dependence, cigarettes, uncomplicated: Secondary | ICD-10-CM | POA: Insufficient documentation

## 2019-06-23 DIAGNOSIS — R0789 Other chest pain: Secondary | ICD-10-CM | POA: Insufficient documentation

## 2019-06-23 NOTE — ED Triage Notes (Signed)
Pt says he was seen here on 7/31 for chest pain and sob, pt says he is not having pain now, but would like a chest xray. Pt says he was supposed to get a work note when seen here last, but did not get one. Pt requesting work note.

## 2019-06-23 NOTE — ED Provider Notes (Signed)
[facility] EMERGENCY DEPARTMENT Provider Note   CSN: [id number] Arrival date & time: 06/23/19  0142  Time seen 2:50 AM  History   Chief Complaint Chief Complaint  Patient presents with   wants work note from last visit    HPI Nathaniel Hansen is a 33 y.o. male.     HPI patient has been seen in the ED on July 29 and July 31 for atypical chest pain and shortness of breath.  He has had a chest x-ray done and a CTA of his chest.  He states he had a chest pain earlier this evening but it is not there now.  He states it comes and goes sometimes once a day sometimes a couple times in 1 day.  The pain will last 3 to 4 seconds.  He states he has mild shortness of breath which he states is not unusual.  He denies any cough or fever.  Patient states he needs a note to go back to work.  Patient denies any drug use other than smoking marijuana.  PCP Patient, No Pcp Per   Past Medical History:  Diagnosis Date   Asthma     There are no active problems to display for this patient.   History reviewed. No pertinent surgical history.      Home Medications    Prior to Admission medications   Not on File    Family History No family history on file.  Social History Social History   Tobacco Use   Smoking status: Current Every Day Smoker    Packs/day: 1.00    Types: Cigarettes   Smokeless tobacco: Never Used  Substance Use Topics   Alcohol use: Yes    Frequency: Never   Drug use: Never    Comment: "sometimes"  [employer] employed   Allergies   Patient has no known allergies.   Review of Systems Review of Systems  All other systems reviewed and are negative.    Physical Exam Updated Vital Signs BP (!) 132/56 (BP Location: Right Arm)    Pulse 77    Temp 98.1 F (36.7 C) (Oral)    Resp 16    Ht 5\' 11"  (1.803 m)    Wt 77.1 kg    SpO2 99%    BMI 23.71 kg/m   Physical Exam Vitals signs and nursing note reviewed.  Constitutional:      General: He is not in acute  distress.    Appearance: Normal appearance. He is well-developed. He is not ill-appearing or toxic-appearing.  HENT:     Head: Normocephalic and atraumatic.     Right Ear: External ear normal.     Left Ear: External ear normal.     Nose: Nose normal. No mucosal edema or rhinorrhea.     Mouth/Throat:     Dentition: No dental abscesses.     Pharynx: No uvula swelling.  Eyes:     Extraocular Movements: Extraocular movements intact.     Conjunctiva/sclera:     Right eye: Right conjunctiva is injected.     Left eye: Left conjunctiva is injected.     Pupils: Pupils are equal, round, and reactive to light.  Neck:     Musculoskeletal: Full passive range of motion without pain, normal range of motion and neck supple.  Cardiovascular:     Rate and Rhythm: Normal rate and regular rhythm.     Heart sounds: Normal heart sounds. No murmur. No friction rub. No gallop.   Pulmonary:  Effort: Pulmonary effort is normal. No respiratory distress.     Breath sounds: Normal breath sounds. No wheezing, rhonchi or rales.  Chest:     Chest wall: No tenderness or crepitus.  Musculoskeletal: Normal range of motion.        General: No tenderness.     Comments: Moves all extremities well.   Skin:    General: Skin is warm and dry.     Capillary Refill: Capillary refill takes less than 2 seconds.     Coloration: Skin is not pale.     Findings: No erythema or rash.  Neurological:     General: No focal deficit present.     Mental Status: He is alert and oriented to person, place, and time.     Cranial Nerves: No cranial nerve deficit.  Psychiatric:        Mood and Affect: Mood normal. Mood is not anxious.        Speech: Speech normal.        Behavior: Behavior normal.        Thought Content: Thought content normal.      ED Treatments / Results  Labs (all labs ordered are listed, but only abnormal results are displayed)  Results for orders placed or performed during the hospital encounter of  06/21/19  Rapid urine drug screen (hospital performed)  Result Value Ref Range   Opiates NONE DETECTED NONE DETECTED   Cocaine NONE DETECTED NONE DETECTED   Benzodiazepines POSITIVE (A) NONE DETECTED   Amphetamines POSITIVE (A) NONE DETECTED   Tetrahydrocannabinol POSITIVE (A) NONE DETECTED   Barbiturates NONE DETECTED NONE DETECTED  CBC with Differential/Platelet  Result Value Ref Range   WBC 4.3 4.0 - 10.5 K/uL   RBC 3.82 (L) 4.22 - 5.81 MIL/uL   Hemoglobin 10.7 (L) 13.0 - 17.0 g/dL   HCT 40.934.8 (L) 81.139.0 - 91.452.0 %   MCV 91.1 80.0 - 100.0 fL   MCH 28.0 26.0 - 34.0 pg   MCHC 30.7 30.0 - 36.0 g/dL   RDW 78.213.5 95.611.5 - 21.315.5 %   Platelets 276 150 - 400 K/uL   nRBC 0.0 0.0 - 0.2 %   Neutrophils Relative % 30 %   Neutro Abs 1.3 (L) 1.7 - 7.7 K/uL   Lymphocytes Relative 56 %   Lymphs Abs 2.5 0.7 - 4.0 K/uL   Monocytes Relative 9 %   Monocytes Absolute 0.4 0.1 - 1.0 K/uL   Eosinophils Relative 4 %   Eosinophils Absolute 0.2 0.0 - 0.5 K/uL   Basophils Relative 1 %   Basophils Absolute 0.0 0.0 - 0.1 K/uL   Immature Granulocytes 0 %   Abs Immature Granulocytes 0.01 0.00 - 0.07 K/uL  Basic metabolic panel  Result Value Ref Range   Sodium 139 135 - 145 mmol/L   Potassium 4.3 3.5 - 5.1 mmol/L   Chloride 106 98 - 111 mmol/L   CO2 26 22 - 32 mmol/L   Glucose, Bld 89 70 - 99 mg/dL   BUN 13 6 - 20 mg/dL   Creatinine, Ser 0.860.94 0.61 - 1.24 mg/dL   Calcium 8.9 8.9 - 57.810.3 mg/dL   GFR calc non Af Amer >60 >60 mL/min   GFR calc Af Amer >60 >60 mL/min   Anion gap 7 5 - 15  Troponin I (High Sensitivity)  Result Value Ref Range   Troponin I (High Sensitivity) <2.0 <18 ng/L  Troponin I (High Sensitivity)  Result Value Ref Range   Troponin I (High  Sensitivity) 2 <18 ng/L   Patient denies any drug use other than marijuana.  EKG None  Radiology No results found.   Ct Head Wo Contrast  Result Date: 06/19/2019 CLINICAL DATA:  Pt states he was feeling lightheaded earlier tonight. Pt states he  bent over and everything went black. Pt states he "passed out because of dehydration" Pt. Would not hold head still^  IMPRESSION: Negative for bleed or other acute intracranial process. Electronically Signed   By: Corlis Leak  Hassell M.D.   On: 06/19/2019 09:21   Ct Angio Chest Pe W And/or Wo Contrast  Result Date: 06/19/2019 CLINICAL DATA:  Lightheadedness, near syncope, dehydration  IMPRESSION: Negative for acute PE or thoracic aortic dissection. Electronically Signed   By: Corlis Leak  Hassell M.D.   On: 06/19/2019 08:00   Dg Chest Portable 1 View  Result Date: 06/21/2019 CLINICAL DATA:  Shortness of breath . IMPRESSION: No evidence of acute cardiopulmonary disease. Electronically Signed   By: Charline BillsSriyesh  Krishnan M.D.   On: 06/21/2019 02:28   Dg Chest Portable 1 View  Result Date: 06/19/2019 CLINICAL DATA:  Syncopal episode.  Shortness of breath. . IMPRESSION: Negative one-view chest x-ray Electronically Signed   By: Marin Robertshristopher  Mattern M.D.   On: 06/19/2019 06:22   Procedures Procedures (including critical care time)  Medications Ordered in ED Medications - No data to display   Initial Impression / Assessment and Plan / ED Course  I have reviewed the triage vital signs and the nursing notes.  Pertinent labs & imaging results that were available during my care of the patient were reviewed by me and considered in my medical decision making (see chart for details).      Patient has been thoroughly evaluated on his recent 2 prior ED visits.  I do not feel like any further testing is needed at this time.  His chest pains only last a few seconds and they are only very intermittent.  He also has no concerns to be tested for COVID, no cough or fever.  Patient was released back to work.  Final Clinical Impressions(s) / ED Diagnoses   Final diagnoses:  Atypical chest pain    ED Discharge Orders    None      Plan discharge  Devoria AlbeIva Elorah Dewing, MD, Concha PyoFACEP    Nickie Deren, MD 06/23/19 [phone]

## 2019-06-23 NOTE — Discharge Instructions (Signed)
Recheck if you get a fever, cough, or struggle to breathe.  You should be evaluated if you have chest pain that is constant and lasts more than 20 to 30 minutes.

## 2019-07-20 ENCOUNTER — Other Ambulatory Visit: Payer: Self-pay

## 2019-07-20 ENCOUNTER — Encounter (HOSPITAL_COMMUNITY): Payer: Self-pay | Admitting: Emergency Medicine

## 2019-07-20 ENCOUNTER — Emergency Department (HOSPITAL_COMMUNITY)
Admission: EM | Admit: 2019-07-20 | Discharge: 2019-07-21 | Disposition: A | Discharge status: Home / Self Care | Payer: Self-pay | Attending: Emergency Medicine | Admitting: Emergency Medicine

## 2019-07-20 DIAGNOSIS — J45909 Unspecified asthma, uncomplicated: Secondary | ICD-10-CM | POA: Insufficient documentation

## 2019-07-20 DIAGNOSIS — F17210 Nicotine dependence, cigarettes, uncomplicated: Secondary | ICD-10-CM | POA: Insufficient documentation

## 2019-07-20 DIAGNOSIS — T675XXA Heat exhaustion, unspecified, initial encounter: Secondary | ICD-10-CM | POA: Insufficient documentation

## 2019-07-20 NOTE — ED Triage Notes (Signed)
Patient states he has been feeling dizzy today. Patient states that he almost passed out around 4 p.m. today.

## 2019-07-21 NOTE — ED Notes (Signed)
Patient given oral fluids. Ginger-ale.

## 2019-07-21 NOTE — ED Provider Notes (Signed)
[facility]GENC[facility]PART[facility] Provider Note   CSN: [id number] Arrival date & time: 07/20/19  2218   Time seen 1:00 AM  History   Chief Complaint Chief Complaint  Patient presents with  . Dizziness    HPI Nathaniel Hansen is a 33 y.o. male.     HPI patient states he felt lightheaded earlier today.  He states he had been walking outside in the heat for a couple of hours and was sweating a lot.  He states he felt like he was going to blackout.  He states he drinks of water and feels better.  He states just prior to coming to the ED sort of felt lightheaded again and had a mild headache.  He said it was a diffuse headache.  He states his urine output is been normal and is not brown or dark but is clear.  He denies any muscle cramps.  PCP Patient, No Pcp Per   Past Medical History:  Diagnosis Date  . Asthma     There are no active problems to display for this patient.   History reviewed. No pertinent surgical history.      Home Medications    Prior to Admission medications   Not on File    Family History History reviewed. No pertinent family history.  Social History Social History   Tobacco Use  . Smoking status: Current Every Day Smoker    Packs/day: 1.00    Types: Cigarettes  . Smokeless tobacco: Never Used  Substance Use Topics  . Alcohol use: Yes    Frequency: Never  . Drug use: Never    Comment: "sometimes"     Allergies   Patient has no known allergies.   Review of Systems Review of Systems  All other systems reviewed and are negative.    Physical Exam Updated Vital Signs BP 127/76 (BP Location: Right Arm)   Pulse 91   Temp 98.4 F (36.9 C) (Oral)   Resp 16   Ht 6' (1.829 m)   Wt 77.1 kg   SpO2 100%   BMI 23.06 kg/m   Physical Exam Vitals signs and nursing note reviewed.  Constitutional:      Appearance: Normal appearance.  HENT:     Head: Normocephalic and atraumatic.     Right Ear: External ear normal.     Left  Ear: External ear normal.     Mouth/Throat:     Mouth: Mucous membranes are dry.     Pharynx: Oropharynx is clear.  Eyes:     Extraocular Movements: Extraocular movements intact.     Conjunctiva/sclera: Conjunctivae normal.     Pupils: Pupils are equal, round, and reactive to light.  Neck:     Musculoskeletal: Normal range of motion.  Cardiovascular:     Rate and Rhythm: Normal rate and regular rhythm.  Pulmonary:     Effort: Pulmonary effort is normal. No respiratory distress.  Musculoskeletal: Normal range of motion.  Skin:    General: Skin is warm and dry.     Findings: No erythema.  Neurological:     General: No focal deficit present.     Mental Status: He is alert and oriented to person, place, and time.     Cranial Nerves: No cranial nerve deficit.  Psychiatric:        Mood and Affect: Mood normal.        Behavior: Behavior normal.        Thought Content: Thought content normal.  ED Treatments / Results  Labs (all labs ordered are listed, but only abnormal results are displayed) Labs Reviewed - No data to display  EKG None  Radiology No results found.  Procedures Procedures (including critical care time)  Medications Ordered in ED Medications - No data to display   Initial Impression / Assessment and Plan / ED Course  I have reviewed the triage vital signs and the nursing notes.  Pertinent labs & imaging results that were available during my care of the patient were reviewed by me and considered in my medical decision making (see chart for details).      I talked to the patient about getting IV fluids but he did not want to do that.  He states he did prefer to drink.  He did not want any testing done.  When I told him that I thought he could be discharged home he states "please let me stay here a little bit longer, I have to walk home".  Patient was allowed to stay in the ED room an extra 2 hours and then he was discharged home.  He was given fluids to  drink by the nursing staff.  Final Clinical Impressions(s) / ED Diagnoses   Final diagnoses:  Heat exhaustion, initial encounter    ED Discharge Orders    None      Plan discharge  Rolland Porter, MD, Barbette Or, MD 07/21/19 [phone]

## 2019-07-21 NOTE — Discharge Instructions (Addendum)
You need to drink plenty of fluids such as sports drinks when you are outside in the heat and sweating a lot.  Please get a bottle on your way home and drink it and drink another one in the morning.

## 2019-07-27 ENCOUNTER — Emergency Department (HOSPITAL_COMMUNITY)
Admission: EM | Admit: 2019-07-27 | Discharge: 2019-07-27 | Disposition: A | Discharge status: Home / Self Care | Payer: Self-pay | Attending: Emergency Medicine | Admitting: Emergency Medicine

## 2019-07-27 ENCOUNTER — Encounter (HOSPITAL_COMMUNITY): Payer: Self-pay

## 2019-07-27 ENCOUNTER — Other Ambulatory Visit: Payer: Self-pay

## 2019-07-27 DIAGNOSIS — R110 Nausea: Secondary | ICD-10-CM | POA: Insufficient documentation

## 2019-07-27 DIAGNOSIS — R55 Syncope and collapse: Secondary | ICD-10-CM | POA: Insufficient documentation

## 2019-07-27 DIAGNOSIS — J45909 Unspecified asthma, uncomplicated: Secondary | ICD-10-CM | POA: Insufficient documentation

## 2019-07-27 DIAGNOSIS — R531 Weakness: Secondary | ICD-10-CM | POA: Insufficient documentation

## 2019-07-27 DIAGNOSIS — F17210 Nicotine dependence, cigarettes, uncomplicated: Secondary | ICD-10-CM | POA: Insufficient documentation

## 2019-07-27 HISTORY — DX: Opioid abuse, uncomplicated: F11.10

## 2019-07-27 LAB — URINALYSIS, ROUTINE W REFLEX MICROSCOPIC
Bacteria, UA: NONE SEEN
Bilirubin Urine: NEGATIVE
Glucose, UA: NEGATIVE mg/dL
Hgb urine dipstick: NEGATIVE
Ketones, ur: NEGATIVE mg/dL
Leukocytes,Ua: NEGATIVE
Nitrite: NEGATIVE
Protein, ur: 30 mg/dL — AB
Specific Gravity, Urine: 1.028 (ref 1.005–1.030)
pH: 6 (ref 5.0–8.0)

## 2019-07-27 LAB — CBC WITH DIFFERENTIAL/PLATELET
Abs Immature Granulocytes: 0.01 10*3/uL (ref 0.00–0.07)
Basophils Absolute: 0 10*3/uL (ref 0.0–0.1)
Basophils Relative: 0 %
Eosinophils Absolute: 0.1 10*3/uL (ref 0.0–0.5)
Eosinophils Relative: 2 %
HCT: 33.3 % — ABNORMAL LOW (ref 39.0–52.0)
Hemoglobin: 10.4 g/dL — ABNORMAL LOW (ref 13.0–17.0)
Immature Granulocytes: 0 %
Lymphocytes Relative: 46 %
Lymphs Abs: 2 10*3/uL (ref 0.7–4.0)
MCH: 27.7 pg (ref 26.0–34.0)
MCHC: 31.2 g/dL (ref 30.0–36.0)
MCV: 88.8 fL (ref 80.0–100.0)
Monocytes Absolute: 0.3 10*3/uL (ref 0.1–1.0)
Monocytes Relative: 7 %
Neutro Abs: 2.1 10*3/uL (ref 1.7–7.7)
Neutrophils Relative %: 45 %
Platelets: 244 10*3/uL (ref 150–400)
RBC: 3.75 MIL/uL — ABNORMAL LOW (ref 4.22–5.81)
RDW: 14.6 % (ref 11.5–15.5)
WBC: 4.5 10*3/uL (ref 4.0–10.5)
nRBC: 0 % (ref 0.0–0.2)

## 2019-07-27 LAB — BASIC METABOLIC PANEL
Anion gap: 9 (ref 5–15)
BUN: 18 mg/dL (ref 6–20)
CO2: 27 mmol/L (ref 22–32)
Calcium: 8.6 mg/dL — ABNORMAL LOW (ref 8.9–10.3)
Chloride: 105 mmol/L (ref 98–111)
Creatinine, Ser: 1.08 mg/dL (ref 0.61–1.24)
GFR calc Af Amer: >60 mL/min (ref >60)
GFR calc non Af Amer: >60 mL/min (ref >60)
Glucose, Bld: 82 mg/dL (ref 70–99)
Potassium: 3.6 mmol/L (ref 3.5–5.1)
Sodium: 141 mmol/L (ref 135–145)

## 2019-07-27 MED ORDER — LACTATED RINGERS IV BOLUS
1000.0000 mL | Freq: Once | INTRAVENOUS | Status: AC
  Administered 2019-07-27: 1000 mL via INTRAVENOUS

## 2019-07-27 MED ORDER — LACTATED RINGERS IV BOLUS
1000.0000 mL | Freq: Once | INTRAVENOUS | Status: AC
  Administered 2019-07-27: 06:00:00 1000 mL via INTRAVENOUS

## 2019-07-27 NOTE — ED Triage Notes (Signed)
Pt reports having "black out spell" just before checking in. Pt says he is withdrawing from opiates, last use was Thursday per report. Pt denies chest pain or dizziness at this time.

## 2019-07-27 NOTE — ED Provider Notes (Signed)
[facility]   CSN: [id number] Arrival date & time: 07/27/19  0226     History   Chief Complaint Chief Complaint  Patient presents with  . Loss of Consciousness    HPI Nathaniel Hansen is a [age] y.o. male.      Loss of Consciousness Episode history:  Single Most recent episode:  Today Duration:  2 minutes Timing:  Constant Progression:  Resolved Chronicity:  New Context: dehydration   Witnessed: no   Relieved by:  Nothing Worsened by:  Nothing Associated symptoms: nausea and weakness   Associated symptoms: no anxiety, no focal weakness, no headaches and no vomiting   Associated symptoms comment:  Recently stopped using opiates, has had w/d symptoms   Past Medical History:  Diagnosis Date  . Asthma   . Opioid abuse (HCC)     There are no active problems to display for this patient.   History reviewed. No pertinent surgical history.      Home Medications    Prior to Admission medications   Not on File    Family History No family history on file.  Social History Social History   Tobacco Use  . Smoking status: Current Every Day Smoker    Packs/day: 1.00    Types: Cigarettes  . Smokeless tobacco: Never Used  Substance Use Topics  . Alcohol use: Yes    Frequency: Never    Comment: occ  . Drug use: Yes    Types: Marijuana    Comment: "sometimes" opioids, has used heroin-not recently per pt     Allergies   Patient has no known allergies.   Review of Systems Review of Systems  Cardiovascular: Positive for syncope.  Gastrointestinal: Positive for nausea. Negative for vomiting.  Neurological: Positive for weakness. Negative for focal weakness and headaches.  All other systems reviewed and are negative.    Physical Exam Updated Vital Signs BP 131/61   Pulse 80   Temp 97.8 F (36.6 C) (Oral)   Resp 15   Ht 6' (1.829 m)   Wt 77.1 kg   SpO2 98%   BMI 23.06 kg/m   Physical Exam Vitals signs and  nursing note reviewed.  Constitutional:      Appearance: He is well-developed.  HENT:     Head: Normocephalic and atraumatic.     Mouth/Throat:     Mouth: Mucous membranes are dry.     Pharynx: Oropharynx is clear.  Eyes:     Conjunctiva/sclera: Conjunctivae normal.  Neck:     Musculoskeletal: Normal range of motion.  Cardiovascular:     Rate and Rhythm: Normal rate.  Pulmonary:     Effort: Pulmonary effort is normal. No respiratory distress.  Abdominal:     General: There is no distension.  Musculoskeletal: Normal range of motion.  Skin:    General: Skin is warm and dry.  Neurological:     General: No focal deficit present.     Mental Status: He is alert.      ED Treatments / Results  Labs (all labs ordered are listed, but only abnormal results are displayed) Labs Reviewed  BASIC METABOLIC PANEL - Abnormal; Notable for the following components:      Result Value   Calcium 8.6 (*)    All other components within normal limits  CBC WITH DIFFERENTIAL/PLATELET - Abnormal; Notable for the following components:   RBC 3.75 (*)    Hemoglobin 10.4 (*)    HCT 33.3 (*)  All other components within normal limits  URINALYSIS, ROUTINE W REFLEX MICROSCOPIC - Abnormal; Notable for the following components:   Protein, ur 30 (*)    All other components within normal limits    EKG EKG Interpretation  Date/Time:  Saturday July 27 2019 03:22:01 EDT Ventricular Rate:  70 PR Interval:    QRS Duration: 106 QT Interval:  401 QTC Calculation: 433 R Axis:   93 Text Interpretation:  Sinus rhythm Consider right ventricular hypertrophy No significant change since last tracing Confirmed by Merrily Pew [phone]) on 07/27/2019 4:22:56 AM   Radiology No results found.  Procedures Procedures (including critical care time)  Medications Ordered in ED Medications  lactated ringers bolus 1,000 mL (0 mLs Intravenous Stopped 07/27/19 0539)  lactated ringers bolus 1,000 mL (1,000 mLs  Intravenous New Bag/Given 07/27/19 0539)     Initial Impression / Assessment and Plan / ED Course  I have reviewed the triage vital signs and the nursing notes.  Pertinent labs & imaging results that were available during my care of the patient were reviewed by me and considered in my medical decision making (see chart for details).   Likely orthostatic related as it happened after he knelt down and stood up quickly. Then 'woke up' propped up against a wall and doesn't know how. Doesn't make sense but will workup for possible cardiac vs metabolic causes for his possible syncopal episode.  Patient very sleepy here. May have just fallen asleep against wall. No emergent causes for syncope were identified. Stable for discharge.   Final Clinical Impressions(s) / ED Diagnoses   Final diagnoses:  Near syncope    ED Discharge Orders    None       Sallie Maker, Corene Cornea, MD 07/27/19 [phone]

## 2019-08-31 ENCOUNTER — Encounter (HOSPITAL_COMMUNITY): Payer: Self-pay | Admitting: *Deleted

## 2019-08-31 ENCOUNTER — Emergency Department (HOSPITAL_COMMUNITY)
Admission: EM | Admit: 2019-08-31 | Discharge: 2019-09-01 | Disposition: A | Discharge status: Home / Self Care | Payer: Self-pay | Attending: Emergency Medicine | Admitting: Emergency Medicine

## 2019-08-31 ENCOUNTER — Other Ambulatory Visit: Payer: Self-pay

## 2019-08-31 ENCOUNTER — Emergency Department (HOSPITAL_COMMUNITY): Payer: Self-pay

## 2019-08-31 DIAGNOSIS — J45909 Unspecified asthma, uncomplicated: Secondary | ICD-10-CM | POA: Insufficient documentation

## 2019-08-31 DIAGNOSIS — F1210 Cannabis abuse, uncomplicated: Secondary | ICD-10-CM | POA: Insufficient documentation

## 2019-08-31 DIAGNOSIS — R109 Unspecified abdominal pain: Secondary | ICD-10-CM | POA: Insufficient documentation

## 2019-08-31 DIAGNOSIS — F17210 Nicotine dependence, cigarettes, uncomplicated: Secondary | ICD-10-CM | POA: Insufficient documentation

## 2019-08-31 DIAGNOSIS — Z20828 Contact with and (suspected) exposure to other viral communicable diseases: Secondary | ICD-10-CM | POA: Insufficient documentation

## 2019-08-31 DIAGNOSIS — J189 Pneumonia, unspecified organism: Secondary | ICD-10-CM | POA: Insufficient documentation

## 2019-08-31 DIAGNOSIS — R42 Dizziness and giddiness: Secondary | ICD-10-CM | POA: Insufficient documentation

## 2019-08-31 LAB — CBC WITH DIFFERENTIAL/PLATELET
Abs Immature Granulocytes: 0.09 10*3/uL — ABNORMAL HIGH (ref 0.00–0.07)
Basophils Absolute: 0 10*3/uL (ref 0.0–0.1)
Basophils Relative: 0 %
Eosinophils Absolute: 0 10*3/uL (ref 0.0–0.5)
Eosinophils Relative: 0 %
HCT: 26.7 % — ABNORMAL LOW (ref 39.0–52.0)
Hemoglobin: 8.7 g/dL — ABNORMAL LOW (ref 13.0–17.0)
Immature Granulocytes: 1 %
Lymphocytes Relative: 9 %
Lymphs Abs: 1.3 10*3/uL (ref 0.7–4.0)
MCH: 25.1 pg — ABNORMAL LOW (ref 26.0–34.0)
MCHC: 32.6 g/dL (ref 30.0–36.0)
MCV: 76.9 fL — ABNORMAL LOW (ref 80.0–100.0)
Monocytes Absolute: 0.7 10*3/uL (ref 0.1–1.0)
Monocytes Relative: 5 %
Neutro Abs: 12.7 10*3/uL — ABNORMAL HIGH (ref 1.7–7.7)
Neutrophils Relative %: 85 %
Platelets: 335 10*3/uL (ref 150–400)
RBC: 3.47 MIL/uL — ABNORMAL LOW (ref 4.22–5.81)
RDW: 16.3 % — ABNORMAL HIGH (ref 11.5–15.5)
WBC: 14.7 10*3/uL — ABNORMAL HIGH (ref 4.0–10.5)
nRBC: 0 % (ref 0.0–0.2)

## 2019-08-31 LAB — URINALYSIS, ROUTINE W REFLEX MICROSCOPIC
Bacteria, UA: NONE SEEN
Bilirubin Urine: NEGATIVE
Glucose, UA: NEGATIVE mg/dL
Hgb urine dipstick: NEGATIVE
Ketones, ur: 20 mg/dL — AB
Leukocytes,Ua: NEGATIVE
Nitrite: NEGATIVE
Protein, ur: 30 mg/dL — AB
Specific Gravity, Urine: 1.018 (ref 1.005–1.030)
pH: 5 (ref 5.0–8.0)

## 2019-08-31 LAB — LACTIC ACID, PLASMA: Lactic Acid, Venous: 0.9 mmol/L (ref 0.5–1.9)

## 2019-08-31 MED ORDER — SODIUM CHLORIDE 0.9 % IV BOLUS
1000.0000 mL | Freq: Once | INTRAVENOUS | Status: AC
  Administered 2019-08-31: 1000 mL via INTRAVENOUS

## 2019-08-31 MED ORDER — ONDANSETRON HCL 4 MG/2ML IJ SOLN
4.0000 mg | Freq: Once | INTRAMUSCULAR | Status: DC
  Filled 2019-08-31: qty 2

## 2019-08-31 MED ORDER — IOHEXOL 300 MG/ML  SOLN
100.0000 mL | Freq: Once | INTRAMUSCULAR | Status: AC | PRN
  Administered 2019-08-31: 100 mL via INTRAVENOUS

## 2019-08-31 NOTE — ED Notes (Signed)
Stopped IVF per pt request. EDP aware

## 2019-08-31 NOTE — ED Provider Notes (Signed)
[facility]ERGENCY DEPARTMENT Provider Note   CSN: [id number] Arrival date & time: 08/31/19  2051     History   Chief Complaint Chief Complaint  Patient presents with   Generalized Body Aches    HPI Nathaniel Hansen is a 33 y.o. male with a history of asthma and opioid abuse presenting with a one-week history of generalized body aches, increasing weakness fevers and chills, nausea without emesis and abdominal pain which has been intermittent, worsened with movement and located in the right lower abdomen.  His fevers have been subjective, he reports waking at night drenched in sweat.  He denies cough, shortness of breath, chest pain, shortness of breath, neck pain or stiffness, rash.  He does endorse headache.  He has had a reduced appetite but has been trying to maintain p.o. intake.  Denies diarrhea or dysuria.  He does feel lightheaded when he stands too quickly and is concerned about dehydration.  To his knowledge he has not been exposed to COVID-19, no other family members have similar symptoms.  He has had no treatments prior to arrival.     The history is provided by the patient.    Past Medical History:  Diagnosis Date   Asthma    Opioid abuse (HCC)     There are no active problems to display for this patient.   History reviewed. No pertinent surgical history.      Home Medications    Prior to Admission medications   Medication Sig Start Date End Date Taking? Authorizing Provider  azithromycin (ZITHROMAX Z-PAK) 250 MG tablet Take one tablet daily until gone. 09/01/19   Eyleen Rawlinson, Raynelle FanningJulie, PA-C  ibuprofen (ADVIL) 600 MG tablet Take 1 tablet (600 mg total) by mouth every 6 (six) hours as needed for moderate pain. 09/01/19   Burgess AmorIdol, Evita Merida, PA-C    Family History No family history on file.  Social History Social History   Tobacco Use   Smoking status: Current Every Day Smoker    Packs/day: 1.00    Types: Cigarettes   Smokeless tobacco: Never Used    Substance Use Topics   Alcohol use: Yes    Frequency: Never    Comment: occ   Drug use: Yes    Types: Marijuana    Comment: "sometimes" opioids, has used heroin-not recently per pt     Allergies   Patient has no known allergies.   Review of Systems Review of Systems  Constitutional: Positive for appetite change, chills, diaphoresis, fatigue and fever.  HENT: Negative for congestion and sore throat.   Eyes: Negative.   Respiratory: Negative for cough, chest tightness and shortness of breath.   Cardiovascular: Negative for chest pain.  Gastrointestinal: Positive for nausea. Negative for abdominal pain, diarrhea and vomiting.  Genitourinary: Negative.  Negative for decreased urine volume and dysuria.  Musculoskeletal: Positive for myalgias. Negative for arthralgias, joint swelling, neck pain and neck stiffness.  Skin: Negative.  Negative for rash and wound.  Neurological: Positive for headaches. Negative for dizziness, weakness, light-headedness and numbness.  Psychiatric/Behavioral: Negative.      Physical Exam Updated Vital Signs BP 100/61 (BP Location: Right Arm)    Pulse (!) 104    Temp 98.4 F (36.9 C) (Oral)    Resp 18    SpO2 100%   Physical Exam Vitals signs and nursing note reviewed.  Constitutional:      Appearance: He is well-developed.  HENT:     Head: Normocephalic and atraumatic.     Right  Ear: Tympanic membrane and ear canal normal.     Left Ear: Tympanic membrane and ear canal normal.     Nose: Mucosal edema present. No rhinorrhea.     Mouth/Throat:     Mouth: Mucous membranes are dry.     Pharynx: Uvula midline. No oropharyngeal exudate or posterior oropharyngeal erythema.     Tonsils: No tonsillar abscesses.  Eyes:     Conjunctiva/sclera: Conjunctivae normal.  Cardiovascular:     Rate and Rhythm: Tachycardia present.     Heart sounds: Normal heart sounds.  Pulmonary:     Effort: Pulmonary effort is normal. No respiratory distress.     Breath  sounds: Normal breath sounds. No wheezing, rhonchi or rales.  Abdominal:     Palpations: Abdomen is soft.     Tenderness: There is abdominal tenderness in the right lower quadrant. There is no guarding or rebound.     Hernia: No hernia is present.  Musculoskeletal: Normal range of motion.  Skin:    General: Skin is warm and dry.     Findings: No rash.  Neurological:     Mental Status: He is alert and oriented to person, place, and time.      ED Treatments / Results  Labs (all labs ordered are listed, but only abnormal results are displayed) Labs Reviewed  CBC WITH DIFFERENTIAL/PLATELET - Abnormal; Notable for the following components:      Result Value   WBC 14.7 (*)    RBC 3.47 (*)    Hemoglobin 8.7 (*)    HCT 26.7 (*)    MCV 76.9 (*)    MCH 25.1 (*)    RDW 16.3 (*)    Neutro Abs 12.7 (*)    Abs Immature Granulocytes 0.09 (*)    All other components within normal limits  COMPREHENSIVE METABOLIC PANEL - Abnormal; Notable for the following components:   Potassium 3.3 (*)    Glucose, Bld 112 (*)    Calcium 8.6 (*)    Albumin 2.8 (*)    AST 12 (*)    All other components within normal limits  URINALYSIS, ROUTINE W REFLEX MICROSCOPIC - Abnormal; Notable for the following components:   Color, Urine AMBER (*)    Ketones, ur 20 (*)    Protein, ur 30 (*)    All other components within normal limits  SARS CORONAVIRUS 2 BY RT PCR (HOSPITAL ORDER, PERFORMED IN [facility])  LACTIC ACID, PLASMA    EKG None  Radiology Dg Chest 2 View  Result Date: 09/01/2019 CLINICAL DATA:  Fever, body aches, dizziness, weakness and chills EXAM: CHEST - 2 VIEW COMPARISON:  CTA 06/19/2019, radiograph 06/21/2019 FINDINGS: No consolidation, features of edema, pneumothorax, or effusion. Pulmonary vascularity is normally distributed. The cardiomediastinal contours are unremarkable. No acute osseous or soft tissue abnormality. IMPRESSION: No acute cardiopulmonary abnormality.  Electronically Signed   By: Kreg Shropshire M.D.   On: 09/01/2019 01:15   Ct Abdomen Pelvis W Contrast  Result Date: 09/01/2019 CLINICAL DATA:  Abdominal pain. Generalized body aches, dizziness, weakness, and fever. Started 1 week ago of. Bilateral lower abdominal pain. Concern for appendicitis. EXAM: CT ABDOMEN AND PELVIS WITH CONTRAST TECHNIQUE: Multidetector CT imaging of the abdomen and pelvis was performed using the standard protocol following bolus administration of intravenous contrast. CONTRAST:  OMNIPAQUE IOHEXOL 300 MG/ML  SOLN COMPARISON:  None. FINDINGS: Lower chest: There is an airspace opacity at the right lung base that is not well characterized secondary to extensive  motion artifact.The heart size is normal. Hepatobiliary: The liver is normal. Normal gallbladder.There is no biliary ductal dilation. Pancreas: Normal contours without ductal dilatation. No peripancreatic fluid collection. Spleen: No splenic laceration or hematoma. Adrenals/Urinary Tract: --Adrenal glands: No adrenal hemorrhage. --Right kidney/ureter: No hydronephrosis or perinephric hematoma. --Left kidney/ureter: No hydronephrosis or perinephric hematoma. --Urinary bladder: Unremarkable. Stomach/Bowel: --Stomach/Duodenum: No hiatal hernia or other gastric abnormality. Normal duodenal course and caliber. --Small bowel: No dilatation or inflammation. --Colon: No focal abnormality. --Appendix: Normal. Vascular/Lymphatic: Normal course and caliber of the major abdominal vessels. --No retroperitoneal lymphadenopathy. --No mesenteric lymphadenopathy. --No pelvic or inguinal lymphadenopathy. Reproductive: Unremarkable Other: No ascites or free air. The abdominal wall is normal. Musculoskeletal. No acute displaced fractures. IMPRESSION: 1. Motion degraded study. 2. No acute abdominopelvic abnormality. Specifically, the appendix is normal. 3. Right lower lobe airspace opacity. This is not well characterized secondary to extensive motion  artifact. This could represent atelectasis or developing infiltrate. Electronically Signed   By: Katherine Mantle M.D.   On: 09/01/2019 00:07    Procedures Procedures (including critical care time)  Medications Ordered in ED Medications  ondansetron (ZOFRAN) injection 4 mg (4 mg Intravenous Refused 08/31/19 2259)  cefTRIAXone (ROCEPHIN) 1 g in sodium chloride 0.9 % 100 mL IVPB (1 g Intravenous New Bag/Given 09/01/19 0120)  ketorolac (TORADOL) 30 MG/ML injection 15 mg (has no administration in time range)  azithromycin (ZITHROMAX) tablet 500 mg (has no administration in time range)  sodium chloride 0.9 % bolus 1,000 mL (0 mLs Intravenous Stopped 09/01/19 0101)  iohexol (OMNIPAQUE) 300 MG/ML solution 100 mL (100 mLs Intravenous Contrast Given 08/31/19 2352)     Initial Impression / Assessment and Plan / ED Course  I have reviewed the triage vital signs and the nursing notes.  Pertinent labs & imaging results that were available during my care of the patient were reviewed by me and considered in my medical decision making (see chart for details).        Patient with a one-week history of febrile illness, CT imaging suggestion of an early right lower lobe infiltrate.  CT is otherwise negative, no intra-abdominal pathology specifically appendix is normal.  Other laboratory values are stable, he does have a moderate leukocytosis.  His COVID-19 test is negative.  He was given IV antibiotics including Rocephin, IV fluids and he also tolerated p.o. intake prior to discharge home.  He was started on a Z-Pak for continued treatment of the suspected pneumonia.  Strict return precautions were discussed.  Referral for outpatient follow-up was also given.  Nathaniel Hansen was evaluated in Emergency Department on 09/01/2019 for the symptoms described in the history of present illness. He was evaluated in the context of the global COVID-19 pandemic, which necessitated consideration that the patient  might be at risk for infection with the SARS-CoV-2 virus that causes COVID-19. Institutional protocols and algorithms that pertain to the evaluation of patients at risk for COVID-19 are in a state of rapid change based on information released by regulatory bodies including the CDC and federal and state organizations. These policies and algorithms were followed during the patient's care in the ED.   Final Clinical Impressions(s) / ED Diagnoses   Final diagnoses:  Community acquired pneumonia of right lower lobe of lung    ED Discharge Orders         Ordered    azithromycin (ZITHROMAX Z-PAK) 250 MG tablet     09/01/19 0127    ibuprofen (ADVIL) 600 MG tablet  Every  6 hours PRN     09/01/19 0127           Evalee Jefferson, PA-C 09/01/19 [place], [place], DO 09/04/19 2359

## 2019-08-31 NOTE — ED Triage Notes (Signed)
Pt c/o generalized body aches, dizziness, weakness, fever and chills that started a week ago,

## 2019-09-01 ENCOUNTER — Emergency Department (HOSPITAL_COMMUNITY): Payer: Self-pay

## 2019-09-01 LAB — COMPREHENSIVE METABOLIC PANEL
ALT: 11 U/L (ref 0–44)
AST: 12 U/L — ABNORMAL LOW (ref 15–41)
Albumin: 2.8 g/dL — ABNORMAL LOW (ref 3.5–5.0)
Alkaline Phosphatase: 83 U/L (ref 38–126)
Anion gap: 12 (ref 5–15)
BUN: 13 mg/dL (ref 6–20)
CO2: 22 mmol/L (ref 22–32)
Calcium: 8.6 mg/dL — ABNORMAL LOW (ref 8.9–10.3)
Chloride: 101 mmol/L (ref 98–111)
Creatinine, Ser: 1.2 mg/dL (ref 0.61–1.24)
GFR calc Af Amer: >60 mL/min (ref >60)
GFR calc non Af Amer: >60 mL/min (ref >60)
Glucose, Bld: 112 mg/dL — ABNORMAL HIGH (ref 70–99)
Potassium: 3.3 mmol/L — ABNORMAL LOW (ref 3.5–5.1)
Sodium: 135 mmol/L (ref 135–145)
Total Bilirubin: 0.9 mg/dL (ref 0.3–1.2)
Total Protein: 8.1 g/dL (ref 6.5–8.1)

## 2019-09-01 LAB — SARS CORONAVIRUS 2 BY RT PCR (HOSPITAL ORDER, PERFORMED IN ~~LOC~~ HOSPITAL LAB): SARS Coronavirus 2: NEGATIVE

## 2019-09-01 MED ORDER — AZITHROMYCIN 250 MG PO TABS
500.0000 mg | ORAL_TABLET | Freq: Once | ORAL | Status: AC
  Administered 2019-09-01: 02:00:00 500 mg via ORAL
  Filled 2019-09-01: qty 2

## 2019-09-01 MED ORDER — IBUPROFEN 600 MG PO TABS: 600.0000 mg | ORAL_TABLET | Freq: Four times a day (QID) | ORAL | 0 refills | Status: DC | PRN

## 2019-09-01 MED ORDER — KETOROLAC TROMETHAMINE 30 MG/ML IJ SOLN
15.0000 mg | Freq: Once | INTRAMUSCULAR | Status: AC
  Administered 2019-09-01: 15 mg via INTRAVENOUS
  Filled 2019-09-01: qty 1

## 2019-09-01 MED ORDER — AZITHROMYCIN 250 MG PO TABS: ORAL_TABLET | ORAL | 0 refills | Status: DC

## 2019-09-01 MED ORDER — SODIUM CHLORIDE 0.9 % IV SOLN
1.0000 g | Freq: Once | INTRAVENOUS | Status: AC
  Administered 2019-09-01: 1 g via INTRAVENOUS
  Filled 2019-09-01: qty 10

## 2019-09-01 NOTE — ED Notes (Signed)
Pt given ginger ale.

## 2019-09-01 NOTE — Discharge Instructions (Addendum)
Take your next dose of the antibiotic Zithromax Sunday evening and complete this antibiotic.  Rest and make sure you are drinking plenty of fluids. Your lab tests tonight are reassuring and your CT scan is negative for any abdominal infections (your appendix is normal and there is no sign of a hernia based on this study).

## 2019-10-07 ENCOUNTER — Encounter (HOSPITAL_COMMUNITY): Payer: Self-pay

## 2019-10-07 ENCOUNTER — Emergency Department (HOSPITAL_COMMUNITY): Payer: Self-pay

## 2019-10-07 ENCOUNTER — Inpatient Hospital Stay (HOSPITAL_COMMUNITY)
Admission: EM | Admit: 2019-10-07 | Discharge: 2019-10-30 | DRG: 871 | Disposition: A | Discharge status: Home / Self Care | Payer: Self-pay | Attending: Family Medicine | Admitting: Family Medicine

## 2019-10-07 ENCOUNTER — Other Ambulatory Visit: Payer: Self-pay

## 2019-10-07 ENCOUNTER — Inpatient Hospital Stay (HOSPITAL_COMMUNITY): Payer: Self-pay

## 2019-10-07 DIAGNOSIS — M549 Dorsalgia, unspecified: Secondary | ICD-10-CM | POA: Diagnosis present

## 2019-10-07 DIAGNOSIS — F1120 Opioid dependence, uncomplicated: Secondary | ICD-10-CM | POA: Diagnosis present

## 2019-10-07 DIAGNOSIS — A409 Streptococcal sepsis, unspecified: Principal | ICD-10-CM

## 2019-10-07 DIAGNOSIS — I071 Rheumatic tricuspid insufficiency: Secondary | ICD-10-CM | POA: Diagnosis present

## 2019-10-07 DIAGNOSIS — D638 Anemia in other chronic diseases classified elsewhere: Secondary | ICD-10-CM | POA: Diagnosis present

## 2019-10-07 DIAGNOSIS — J45909 Unspecified asthma, uncomplicated: Secondary | ICD-10-CM | POA: Diagnosis present

## 2019-10-07 DIAGNOSIS — M545 Low back pain: Secondary | ICD-10-CM | POA: Diagnosis present

## 2019-10-07 DIAGNOSIS — F1990 Other psychoactive substance use, unspecified, uncomplicated: Secondary | ICD-10-CM | POA: Diagnosis present

## 2019-10-07 DIAGNOSIS — R6521 Severe sepsis with septic shock: Secondary | ICD-10-CM | POA: Diagnosis present

## 2019-10-07 DIAGNOSIS — R0600 Dyspnea, unspecified: Secondary | ICD-10-CM

## 2019-10-07 DIAGNOSIS — E872 Acidosis, unspecified: Secondary | ICD-10-CM | POA: Diagnosis present

## 2019-10-07 DIAGNOSIS — R109 Unspecified abdominal pain: Secondary | ICD-10-CM

## 2019-10-07 DIAGNOSIS — F17210 Nicotine dependence, cigarettes, uncomplicated: Secondary | ICD-10-CM | POA: Diagnosis present

## 2019-10-07 DIAGNOSIS — Z20828 Contact with and (suspected) exposure to other viral communicable diseases: Secondary | ICD-10-CM | POA: Diagnosis present

## 2019-10-07 DIAGNOSIS — D72829 Elevated white blood cell count, unspecified: Secondary | ICD-10-CM | POA: Diagnosis present

## 2019-10-07 DIAGNOSIS — J181 Lobar pneumonia, unspecified organism: Secondary | ICD-10-CM | POA: Diagnosis present

## 2019-10-07 DIAGNOSIS — Z8701 Personal history of pneumonia (recurrent): Secondary | ICD-10-CM

## 2019-10-07 DIAGNOSIS — Z5181 Encounter for therapeutic drug level monitoring: Secondary | ICD-10-CM

## 2019-10-07 DIAGNOSIS — I079 Rheumatic tricuspid valve disease, unspecified: Secondary | ICD-10-CM | POA: Diagnosis present

## 2019-10-07 DIAGNOSIS — J189 Pneumonia, unspecified organism: Secondary | ICD-10-CM | POA: Diagnosis present

## 2019-10-07 DIAGNOSIS — D7589 Other specified diseases of blood and blood-forming organs: Secondary | ICD-10-CM | POA: Diagnosis present

## 2019-10-07 DIAGNOSIS — B955 Unspecified streptococcus as the cause of diseases classified elsewhere: Secondary | ICD-10-CM | POA: Diagnosis present

## 2019-10-07 DIAGNOSIS — K5900 Constipation, unspecified: Secondary | ICD-10-CM | POA: Diagnosis present

## 2019-10-07 DIAGNOSIS — A419 Sepsis, unspecified organism: Secondary | ICD-10-CM | POA: Diagnosis present

## 2019-10-07 DIAGNOSIS — G8929 Other chronic pain: Secondary | ICD-10-CM | POA: Diagnosis present

## 2019-10-07 DIAGNOSIS — I330 Acute and subacute infective endocarditis: Secondary | ICD-10-CM | POA: Diagnosis present

## 2019-10-07 DIAGNOSIS — R509 Fever, unspecified: Secondary | ICD-10-CM | POA: Diagnosis present

## 2019-10-07 DIAGNOSIS — D649 Anemia, unspecified: Secondary | ICD-10-CM | POA: Diagnosis present

## 2019-10-07 LAB — CBC WITH DIFFERENTIAL/PLATELET
Abs Immature Granulocytes: 0.06 10*3/uL (ref 0.00–0.07)
Basophils Absolute: 0 10*3/uL (ref 0.0–0.1)
Basophils Relative: 0 %
Eosinophils Absolute: 0 10*3/uL (ref 0.0–0.5)
Eosinophils Relative: 0 %
HCT: 19.5 % — ABNORMAL LOW (ref 39.0–52.0)
Hemoglobin: 5.9 g/dL — CL (ref 13.0–17.0)
Immature Granulocytes: 1 %
Lymphocytes Relative: 6 %
Lymphs Abs: 0.4 10*3/uL — ABNORMAL LOW (ref 0.7–4.0)
MCH: 23.1 pg — ABNORMAL LOW (ref 26.0–34.0)
MCHC: 30.3 g/dL (ref 30.0–36.0)
MCV: 76.5 fL — ABNORMAL LOW (ref 80.0–100.0)
Monocytes Absolute: 0.1 10*3/uL (ref 0.1–1.0)
Monocytes Relative: 1 %
Neutro Abs: 6.6 10*3/uL (ref 1.7–7.7)
Neutrophils Relative %: 92 %
Platelets: 354 10*3/uL (ref 150–400)
RBC: 2.55 MIL/uL — ABNORMAL LOW (ref 4.22–5.81)
RDW: 19.6 % — ABNORMAL HIGH (ref 11.5–15.5)
WBC: 7.1 10*3/uL (ref 4.0–10.5)
nRBC: 0.3 % — ABNORMAL HIGH (ref 0.0–0.2)

## 2019-10-07 LAB — APTT: aPTT: 34 seconds (ref 24–36)

## 2019-10-07 LAB — COMPREHENSIVE METABOLIC PANEL
ALT: 10 U/L (ref 0–44)
AST: 9 U/L — ABNORMAL LOW (ref 15–41)
Albumin: 2.5 g/dL — ABNORMAL LOW (ref 3.5–5.0)
Alkaline Phosphatase: 73 U/L (ref 38–126)
Anion gap: 12 (ref 5–15)
BUN: 9 mg/dL (ref 6–20)
CO2: 25 mmol/L (ref 22–32)
Calcium: 8.2 mg/dL — ABNORMAL LOW (ref 8.9–10.3)
Chloride: 98 mmol/L (ref 98–111)
Creatinine, Ser: 0.94 mg/dL (ref 0.61–1.24)
GFR calc Af Amer: >60 mL/min (ref >60)
GFR calc non Af Amer: >60 mL/min (ref >60)
Glucose, Bld: 128 mg/dL — ABNORMAL HIGH (ref 70–99)
Potassium: 3.5 mmol/L (ref 3.5–5.1)
Sodium: 135 mmol/L (ref 135–145)
Total Bilirubin: 0.3 mg/dL (ref 0.3–1.2)
Total Protein: 7.4 g/dL (ref 6.5–8.1)

## 2019-10-07 LAB — IRON AND TIBC
Iron: 7 ug/dL — ABNORMAL LOW (ref 45–182)
Saturation Ratios: 3 % — ABNORMAL LOW (ref 17.9–39.5)
TIBC: 222 ug/dL — ABNORMAL LOW (ref 250–450)
UIBC: 215 ug/dL

## 2019-10-07 LAB — URINALYSIS, ROUTINE W REFLEX MICROSCOPIC
Bilirubin Urine: NEGATIVE
Glucose, UA: NEGATIVE mg/dL
Hgb urine dipstick: NEGATIVE
Ketones, ur: NEGATIVE mg/dL
Leukocytes,Ua: NEGATIVE
Nitrite: NEGATIVE
Protein, ur: NEGATIVE mg/dL
Specific Gravity, Urine: 1.012 (ref 1.005–1.030)
pH: 5 (ref 5.0–8.0)

## 2019-10-07 LAB — FERRITIN: Ferritin: 248 ng/mL (ref 24–336)

## 2019-10-07 LAB — VITAMIN B12: Vitamin B-12: 315 pg/mL (ref 180–914)

## 2019-10-07 LAB — PREPARE RBC (CROSSMATCH)

## 2019-10-07 LAB — PROCALCITONIN: Procalcitonin: 14.17 ng/mL

## 2019-10-07 LAB — RETICULOCYTES
Immature Retic Fract: 25.6 % — ABNORMAL HIGH (ref 2.3–15.9)
RBC.: 2.81 MIL/uL — ABNORMAL LOW (ref 4.22–5.81)
Retic Count, Absolute: 61.8 10*3/uL (ref 19.0–186.0)
Retic Ct Pct: 2.2 % (ref 0.4–3.1)

## 2019-10-07 LAB — LACTIC ACID, PLASMA
Lactic Acid, Venous: 2.5 mmol/L (ref 0.5–1.9)
Lactic Acid, Venous: 1.1 mmol/L (ref 0.5–1.9)

## 2019-10-07 LAB — PROTIME-INR
INR: 1.3 — ABNORMAL HIGH (ref 0.8–1.2)
Prothrombin Time: 15.8 seconds — ABNORMAL HIGH (ref 11.4–15.2)

## 2019-10-07 LAB — SARS CORONAVIRUS 2 (TAT 6-24 HRS): SARS Coronavirus 2: NEGATIVE

## 2019-10-07 LAB — FOLATE: Folate: 14.9 ng/mL (ref >5.9)

## 2019-10-07 LAB — ABO/RH: ABO/RH(D): O POS

## 2019-10-07 LAB — LACTATE DEHYDROGENASE: LDH: 177 U/L (ref 98–192)

## 2019-10-07 LAB — CORTISOL: Cortisol, Plasma: 21.6 ug/dL

## 2019-10-07 LAB — SAVE SMEAR(SSMR), FOR PROVIDER SLIDE REVIEW

## 2019-10-07 MED ORDER — VANCOMYCIN HCL 1.5 G IV SOLR
1500.0000 mg | Freq: Once | INTRAVENOUS | Status: AC
  Administered 2019-10-07: 1500 mg via INTRAVENOUS
  Filled 2019-10-07: qty 1500

## 2019-10-07 MED ORDER — SODIUM CHLORIDE 0.9 % IV SOLN
2.0000 g | INTRAVENOUS | Status: DC
  Administered 2019-10-07 – 2019-10-09 (×3): 2 g via INTRAVENOUS
  Filled 2019-10-07 (×3): qty 20

## 2019-10-07 MED ORDER — SODIUM CHLORIDE 0.9% IV SOLUTION
Freq: Once | INTRAVENOUS | Status: AC
  Administered 2019-10-07: 14:00:00 via INTRAVENOUS

## 2019-10-07 MED ORDER — LACTATED RINGERS IV BOLUS (SEPSIS)
1000.0000 mL | Freq: Once | INTRAVENOUS | Status: AC
  Administered 2019-10-07: 1000 mL via INTRAVENOUS

## 2019-10-07 MED ORDER — IBUPROFEN 800 MG PO TABS
800.0000 mg | ORAL_TABLET | Freq: Once | ORAL | Status: AC
  Administered 2019-10-07: 800 mg via ORAL
  Filled 2019-10-07: qty 1

## 2019-10-07 MED ORDER — LACTATED RINGERS IV BOLUS (SEPSIS)
250.0000 mL | Freq: Once | INTRAVENOUS | Status: AC
  Administered 2019-10-07: 250 mL via INTRAVENOUS

## 2019-10-07 MED ORDER — VANCOMYCIN HCL IN DEXTROSE 1-5 GM/200ML-% IV SOLN
1000.0000 mg | Freq: Three times a day (TID) | INTRAVENOUS | Status: DC
  Administered 2019-10-07 – 2019-10-09 (×5): 1000 mg via INTRAVENOUS
  Filled 2019-10-07 (×5): qty 200

## 2019-10-07 MED ORDER — SODIUM CHLORIDE 0.9 % IV SOLN
500.0000 mg | INTRAVENOUS | Status: DC
  Administered 2019-10-07 – 2019-10-08 (×2): 500 mg via INTRAVENOUS
  Filled 2019-10-07 (×2): qty 500

## 2019-10-07 MED ORDER — ACETAMINOPHEN 500 MG PO TABS
1000.0000 mg | ORAL_TABLET | Freq: Once | ORAL | Status: AC
  Administered 2019-10-07: 11:00:00 1000 mg via ORAL
  Filled 2019-10-07: qty 2

## 2019-10-07 MED ORDER — POTASSIUM CHLORIDE IN NACL 20-0.9 MEQ/L-% IV SOLN
INTRAVENOUS | Status: DC
  Administered 2019-10-07 – 2019-10-08 (×3): via INTRAVENOUS

## 2019-10-07 MED ORDER — HYDROCORTISONE NA SUCCINATE PF 100 MG IJ SOLR
50.0000 mg | Freq: Four times a day (QID) | INTRAMUSCULAR | Status: DC
  Administered 2019-10-07 – 2019-10-08 (×3): 50 mg via INTRAVENOUS
  Filled 2019-10-07 (×3): qty 2

## 2019-10-07 MED ORDER — NOREPINEPHRINE 4 MG/250ML-% IV SOLN
0.0000 ug/min | INTRAVENOUS | Status: DC
  Administered 2019-10-07 – 2019-10-08 (×2): 2 ug/min via INTRAVENOUS
  Filled 2019-10-07 (×2): qty 250

## 2019-10-07 MED ORDER — ACETAMINOPHEN 325 MG PO TABS
650.0000 mg | ORAL_TABLET | ORAL | Status: DC | PRN
  Administered 2019-10-10 – 2019-10-21 (×12): 650 mg via ORAL
  Filled 2019-10-07 (×12): qty 2

## 2019-10-07 MED ORDER — ONDANSETRON HCL 4 MG/2ML IJ SOLN: 4.0000 mg | Freq: Four times a day (QID) | INTRAMUSCULAR | Status: DC | PRN

## 2019-10-07 NOTE — Progress Notes (Signed)
Pharmacy Antibiotic Note  Nathaniel Hansen is a 33 y.o. male admitted on 10/07/2019 with sepsis.  Pharmacy has been consulted for Vancomycin dosing. Presented to ED with generalized weakness. Patient was hypotensive and tachycardic upon presentation to ED.Chest xray shows Left-sided PNA. Elevated LA. Central line place, NE infusion initiated  Plan: Vancomycin 1500mg  loading dose, then 1000mg  IV every 8 hours.  Goal trough 15-20 mcg/mL.  Also on ceftriaxone 2gm IV q24h Azithromycin 500mg  IV q24h F/U cxs and clinical progress Monitor V/S, labs and levels as indicated  Height: 5\' 11"  (180.3 cm) Weight: 165 lb (74.8 kg) IBW/kg (Calculated) : 75.3  Temp (24hrs), Avg:100.9 F (38.3 C), Min:100.2 F (37.9 C), Max:102.8 F (39.3 C)  Recent Labs  Lab 10/07/19 1126  WBC 7.1  CREATININE 0.94  LATICACIDVEN 2.5*    Estimated Creatinine Clearance: 120.5 mL/min (by C-G formula based on SCr of 0.94 mg/dL).    No Known Allergies  Antimicrobials this admission: Vancomycin 11/16>> Ceftriaxone 11/16 >> Azithromycin 11/16>>   Dose adjustments this admission: n/a  Microbiology results: 11/16 BCx: pending 11/16 UCx: pending 11/16 SARS-2 CV pending  MRSA PCR:   Thank you for allowing pharmacy to be a part of this patient's care.  Isac Sarna, BS [place], [place] Clinical Pharmacist Pager [phone] 10/07/2019 2:57 PM
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
## 2019-10-07 NOTE — ED Notes (Signed)
Pt given meal tray.

## 2019-10-07 NOTE — ED Notes (Signed)
Patient given meal tray.

## 2019-10-07 NOTE — ED Notes (Signed)
Notified phlebotomist of Type and Screen order, and that pt is urgent.

## 2019-10-07 NOTE — ED Notes (Signed)
Attempted report to ICU, nurse unavailable.

## 2019-10-07 NOTE — ED Provider Notes (Signed)
[facility]GENCY DEPARTMENT Provider Note   CSN: [id number] Arrival date & time: 10/07/19  1027     History   Chief Complaint Chief Complaint  Patient presents with  . Fatigue    HPI Nathaniel Hansen is a 33 y.o. male.     HPI  This patient is a 33 year old male who is ill-appearing presenting with fever tachycardia tachypnea and hypotension.  He reports that he woke up this morning and when he tried to stand up he felt severely lightheaded, he felt generally ill but did not have any specific complaints.  He does report over the last couple of days having a slight left-sided chest pain and an occasional cough and recalls that about a month ago he developed what he was told was a pneumonia and given an antibiotic and seemed to improve.  He denies vomiting, diarrhea, nausea, headache, blurred vision, rashes, swelling, abdominal pain or any other complaints.  He has not had any medications prior to arrival.  He notes that he has to friend to take him to the store but because he almost passed out when he got into the car he decided to come here instead.  The patient is an asthmatic, he does not have any other chronic medical issues  Review of the medical record shows that the patient was diagnosed with community-acquired pneumonia approximately 1 month ago on October 10, this was found on a CT scan at the right lung base, it was not seen on the chest x-ray.    Past Medical History:  Diagnosis Date  . Asthma   . Opioid abuse (HCC)     There are no active problems to display for this patient.   History reviewed. No pertinent surgical history.      Home Medications    Prior to Admission medications   Medication Sig Start Date End Date Taking? Authorizing Provider  azithromycin (ZITHROMAX Z-PAK) 250 MG tablet Take one tablet daily until gone. Patient not taking: Reported on 10/07/2019 09/01/19   Burgess AmorIdol, Julie, PA-C  ibuprofen (ADVIL) 600 MG tablet Take 1 tablet (600  mg total) by mouth every 6 (six) hours as needed for moderate pain. Patient not taking: Reported on 10/07/2019 09/01/19   Burgess AmorIdol, Julie, PA-C    Family History No family history on file.  Social History Social History   Tobacco Use  . Smoking status: Current Every Day Smoker    Packs/day: 1.00    Types: Cigarettes  . Smokeless tobacco: Never Used  Substance Use Topics  . Alcohol use: Yes    Frequency: Never    Comment: occ  . Drug use: Yes    Types: Marijuana    Comment: "sometimes" opioids, has used heroin-not recently per pt     Allergies   Patient has no known allergies.   Review of Systems Review of Systems  Constitutional: Positive for fatigue and fever. Negative for chills.  HENT: Negative for sore throat.   Eyes: Negative for visual disturbance.  Respiratory: Positive for cough. Negative for shortness of breath.   Cardiovascular: Positive for chest pain.  Gastrointestinal: Negative for abdominal pain, diarrhea, nausea and vomiting.  Genitourinary: Negative for dysuria and frequency.  Musculoskeletal: Negative for back pain and neck pain.  Skin: Negative for rash.  Neurological: Positive for dizziness. Negative for weakness, numbness and headaches.  Hematological: Negative for adenopathy.  Psychiatric/Behavioral: Negative for behavioral problems.     Physical Exam Updated Vital Signs BP (!) 85/51   Pulse 96  Temp 100.2 F (37.9 C) (Oral)   Resp 18   Ht 1.803 m (5\' 11" )   Wt 74.8 kg   SpO2 99%   BMI 23.01 kg/m   Physical Exam Vitals signs and nursing note reviewed.  Constitutional:      General: He is in acute distress.     Appearance: He is well-developed. He is ill-appearing.  HENT:     Head: Normocephalic and atraumatic.     Mouth/Throat:     Pharynx: No oropharyngeal exudate.  Eyes:     General: No scleral icterus.       Right eye: No discharge.        Left eye: No discharge.     Conjunctiva/sclera: Conjunctivae normal.     Pupils:  Pupils are equal, round, and reactive to light.  Neck:     Musculoskeletal: Normal range of motion and neck supple.     Thyroid: No thyromegaly.     Vascular: No JVD.  Cardiovascular:     Rate and Rhythm: Regular rhythm. Tachycardia present.     Heart sounds: Normal heart sounds. No murmur. No friction rub. No gallop.   Pulmonary:     Effort: Pulmonary effort is normal. No respiratory distress.     Breath sounds: Rales present. No wheezing.     Comments: Rales are present at the left base, mild tachypnea Abdominal:     General: Bowel sounds are normal. There is no distension.     Palpations: Abdomen is soft. There is no mass.     Tenderness: There is no abdominal tenderness.  Musculoskeletal: Normal range of motion.        General: No tenderness.  Lymphadenopathy:     Cervical: No cervical adenopathy.  Skin:    General: Skin is warm and dry.     Findings: No erythema or rash.  Neurological:     Mental Status: He is alert.     Coordination: Coordination normal.     Comments: The patient is awake alert and following commands but is generally diffusely weak  Psychiatric:        Behavior: Behavior normal.      ED Treatments / Results  Labs (all labs ordered are listed, but only abnormal results are displayed) Labs Reviewed  LACTIC ACID, PLASMA - Abnormal; Notable for the following components:      Result Value   Lactic Acid, Venous 2.5 (*)    All other components within normal limits  COMPREHENSIVE METABOLIC PANEL - Abnormal; Notable for the following components:   Glucose, Bld 128 (*)    Calcium 8.2 (*)    Albumin 2.5 (*)    AST 9 (*)    All other components within normal limits  CBC WITH DIFFERENTIAL/PLATELET - Abnormal; Notable for the following components:   RBC 2.55 (*)    Hemoglobin 5.9 (*)    HCT 19.5 (*)    MCV 76.5 (*)    MCH 23.1 (*)    RDW 19.6 (*)    nRBC 0.3 (*)    Lymphs Abs 0.4 (*)    All other components within normal limits  PROTIME-INR -  Abnormal; Notable for the following components:   Prothrombin Time 15.8 (*)    INR 1.3 (*)    All other components within normal limits  CULTURE, BLOOD (ROUTINE X 2)  CULTURE, BLOOD (ROUTINE X 2)  URINE CULTURE  SARS CORONAVIRUS 2 (TAT 6-24 HRS)  APTT  URINALYSIS, ROUTINE W REFLEX MICROSCOPIC  PROCALCITONIN  LACTIC ACID, PLASMA  TYPE AND SCREEN  PREPARE RBC (CROSSMATCH)  ABO/RH  ANTIBODY SCREEN    EKG EKG Interpretation  Date/Time:  Monday October 07 2019 10:52:59 EST Ventricular Rate:  129 PR Interval:    QRS Duration: 93 QT Interval:  303 QTC Calculation: 444 R Axis:   -47 Text Interpretation: Sinus tachycardia Probable left atrial enlargement S1,S2,S3 pattern RSR' in V1 or V2, probably normal variant Borderline T wave abnormalities Since last tracing rate faster Confirmed by Noemi Chapel [phone]) on 10/07/2019 11:01:27 AM   Radiology Dg Chest Port 1 View  Result Date: 10/07/2019 CLINICAL DATA:  Chest pain on the left with cough EXAM: PORTABLE CHEST 1 VIEW COMPARISON:  September 01, 2019 FINDINGS: There is patchy airspace opacity consistent with pneumonia in the left base. The lungs elsewhere are clear. Heart size and pulmonary vascularity are within normal limits. No adenopathy. No bone lesions. IMPRESSION: Airspace opacity consistent with pneumonia left base. Lungs elsewhere clear. Cardiac silhouette within normal limits. No evident adenopathy. Electronically Signed   By: Lowella Grip III M.D.   On: 10/07/2019 11:50    Procedures .Critical Care Performed by: Noemi Chapel, MD Authorized by: Noemi Chapel, MD   Critical care provider statement:    Critical care time (minutes):  35   Critical care time was exclusive of:  Separately billable procedures and treating other patients and teaching time   Critical care was necessary to treat or prevent imminent or life-threatening deterioration of the following conditions:  Sepsis   Critical care was time spent  personally by me on the following activities:  Blood draw for specimens, development of treatment plan with patient or surrogate, discussions with consultants, evaluation of patient's response to treatment, examination of patient, obtaining history from patient or surrogate, ordering and performing treatments and interventions, ordering and review of laboratory studies, ordering and review of radiographic studies, pulse oximetry, re-evaluation of patient's condition and review of old charts   (including critical care time)  Medications Ordered in ED Medications  cefTRIAXone (ROCEPHIN) 2 g in sodium chloride 0.9 % 100 mL IVPB (2 g Intravenous New Bag/Given 10/07/19 1135)  azithromycin (ZITHROMAX) 500 mg in sodium chloride 0.9 % 250 mL IVPB (500 mg Intravenous New Bag/Given 10/07/19 1135)  0.9 %  sodium chloride infusion (Manually program via Guardrails IV Fluids) (has no administration in time range)  norepinephrine (LEVOPHED) 4mg  in 266mL premix infusion (2 mcg/min Intravenous New Bag/Given 10/07/19 1325)  lactated ringers bolus 1,000 mL (0 mLs Intravenous Stopped 10/07/19 1301)    And  lactated ringers bolus 1,000 mL (0 mLs Intravenous Stopped 10/07/19 1301)    And  lactated ringers bolus 250 mL (0 mLs Intravenous Stopped 10/07/19 1301)  acetaminophen (TYLENOL) tablet 1,000 mg (1,000 mg Oral Given 10/07/19 1121)  ibuprofen (ADVIL) tablet 800 mg (800 mg Oral Given 10/07/19 1121)     Initial Impression / Assessment and Plan / ED Course  I have reviewed the triage vital signs and the nursing notes.  Pertinent labs & imaging results that were available during my care of the patient were reviewed by me and considered in my medical decision making (see chart for details).  Clinical Course as of Oct 06 1404  Mon Oct 07, 2019  1152 I have personally viewed the x-ray, there does appear to be some increasing opacities at the left base consistent with possible pneumonia.  There is no signs of  pneumothorax, no subdiaphragmatic air, normal mediastinum and soft tissues, midline trachea, normal bony  structures.   [BM]  1153 Repeat temperature shows 102.8, this explains more the tachycardia and hypotension, IV fluids running.  Urinalysis clean without signs of infection.   [BM]  1223 The patient continues to be tachycardic, but last blood pressure check was 88/51 with a MAP of 61.  The hemoglobin is severely depressed at 5.9, there is not a definite cause for this, he has been anemic in the past but nothing like this.  His lactic acid is 2.5, albumin is low at less than 2.5, it is not clear exactly why the patient is sick but his procalcitonin is greater than 14 suggesting severe sepsis and septic shock.  At this time the patient has not yet received his 30 cc/kg but he is getting close   [BM]  1401 The central line was placed by Ms. Arthor Captain under my direct supervision successfully, post placement x-ray ordered   [BM]    Clinical Course User Index [BM] Eber Hong, MD      I am genuinely concerned that this patient may have sepsis or even septic shock given his initial blood pressure of 90/50 with a pulse of 138.  His temperature is 100.7, he is tachycardic, he has findings consistent with possible pneumonia.  I do not hear a murmur but he is very tachycardic.  He does not have a history of IV drug use.  He will be tested for sepsis, blood cultures, 30cc/kg by IV with lactated Ringer's, check a lactic acid, blood cultures, urine and urine culture, chest x-ray.  He will need to be admitted to the hospital, he is critically ill.  Antibiotics for community-acquired pneumonia chosen through the sepsis order set.  I suspect there may be complicating factor here given his high central venous pressure clinically with venous distention, JVD and a tight distended internal jugular vein given his hypotension, some cardiac output failure may be likely.  Discussed with Dr. Kerry Hough who will come to  admit.  The patient will likely need to go to the intensive care unit.  Final Clinical Impressions(s) / ED Diagnoses   Final diagnoses:  Septic shock (HCC)  Severe anemia      Eber Hong, MD 10/07/19 [phone]

## 2019-10-07 NOTE — ED Notes (Signed)
Date and time results received: 10/07/19 11:59 AM  (use smartphrase ".now" to insert current time)  Test: lactic acid Critical Value: 2.5  Name of Provider Notified: Sabra Heck MD  Orders Received? Or Actions Taken?: na

## 2019-10-07 NOTE — ED Notes (Signed)
Colllected type and screen. Pt has bloodbank armband on.

## 2019-10-07 NOTE — Progress Notes (Signed)
Notified bedside nurse of need to draw repeat lactic acid. 

## 2019-10-07 NOTE — ED Notes (Addendum)
Date and time results received: 10/07/19 11:57 AM  (use smartphrase ".now" to insert current time)  Test: hemoglobin Critical Value: 5.9  Name of Provider Notified: Sabra Heck MD  Orders Received? Or Actions Taken?: na

## 2019-10-07 NOTE — ED Provider Notes (Signed)
.  Central Line  Date/Time: 10/07/2019 2:06 PM Performed by: Margarita Mail, PA-C Authorized by: Margarita Mail, PA-C   Consent:    Consent obtained:  Verbal and written   Consent given by:  Patient   Risks discussed:  Arterial puncture, incorrect placement, nerve damage, pneumothorax, infection and bleeding   Alternatives discussed:  No treatment Pre-procedure details:    Hand hygiene: Hand hygiene performed prior to insertion     Sterile barrier technique: All elements of maximal sterile technique followed     Skin preparation:  2% chlorhexidine   Skin preparation agent: Skin preparation agent completely dried prior to procedure   Anesthesia (see MAR for exact dosages):    Anesthesia method:  Local infiltration   Local anesthetic:  Lidocaine 1% WITH epi Procedure details:    Location:  R internal jugular   Patient position:  Trendelenburg   Procedural supplies:  Triple lumen   Landmarks identified: yes     Ultrasound guidance: yes     Sterile ultrasound techniques: Sterile gel and sterile probe covers were used     Number of attempts:  1   Successful placement: yes   Post-procedure details:    Post-procedure:  Dressing applied and line sutured   Assessment:  Blood return through all ports and free fluid flow   Patient tolerance of procedure:  Tolerated well, no immediate complications      Margarita Mail, PA-C 10/07/19 1410    Noemi Chapel, MD 10/10/19 [phone]

## 2019-10-07 NOTE — ED Notes (Signed)
Obtained lactic acid through central line.

## 2019-10-07 NOTE — ED Notes (Signed)
Attempted report to ICU RN. Nurse unable to take report at this time, will call back.

## 2019-10-07 NOTE — ED Triage Notes (Signed)
Pt reports fatigue and feeling lightheaded when he stands.  Denies fever, n/v/d, cough, or sob.

## 2019-10-07 NOTE — ED Notes (Signed)
Notified Caryl Pina, phlebotomist, of lactic acid redraw.

## 2019-10-08 DIAGNOSIS — R7881 Bacteremia: Secondary | ICD-10-CM | POA: Diagnosis present

## 2019-10-08 DIAGNOSIS — B955 Unspecified streptococcus as the cause of diseases classified elsewhere: Secondary | ICD-10-CM | POA: Diagnosis present

## 2019-10-08 LAB — BPAM RBC
Blood Product Expiration Date: 202012162359
Blood Product Expiration Date: 202012162359
ISSUE DATE / TIME: 202011161356
ISSUE DATE / TIME: 202011161742
Unit Type and Rh: 5100
Unit Type and Rh: 5100

## 2019-10-08 LAB — BASIC METABOLIC PANEL
Anion gap: 7 (ref 5–15)
BUN: 13 mg/dL (ref 6–20)
CO2: 24 mmol/L (ref 22–32)
Calcium: 8.1 mg/dL — ABNORMAL LOW (ref 8.9–10.3)
Chloride: 110 mmol/L (ref 98–111)
Creatinine, Ser: 0.88 mg/dL (ref 0.61–1.24)
GFR calc Af Amer: >60 mL/min (ref >60)
GFR calc non Af Amer: >60 mL/min (ref >60)
Glucose, Bld: 132 mg/dL — ABNORMAL HIGH (ref 70–99)
Potassium: 4.9 mmol/L (ref 3.5–5.1)
Sodium: 141 mmol/L (ref 135–145)

## 2019-10-08 LAB — URINE CULTURE: Culture: NO GROWTH

## 2019-10-08 LAB — CBC
HCT: 28.8 % — ABNORMAL LOW (ref 39.0–52.0)
Hemoglobin: 8.6 g/dL — ABNORMAL LOW (ref 13.0–17.0)
MCH: 24 pg — ABNORMAL LOW (ref 26.0–34.0)
MCHC: 29.9 g/dL — ABNORMAL LOW (ref 30.0–36.0)
MCV: 80.2 fL (ref 80.0–100.0)
Platelets: 377 10*3/uL (ref 150–400)
RBC: 3.59 MIL/uL — ABNORMAL LOW (ref 4.22–5.81)
RDW: 19.2 % — ABNORMAL HIGH (ref 11.5–15.5)
WBC: 17 10*3/uL — ABNORMAL HIGH (ref 4.0–10.5)
nRBC: 0 % (ref 0.0–0.2)

## 2019-10-08 LAB — TYPE AND SCREEN
ABO/RH(D): O POS
Antibody Screen: NEGATIVE
Unit division: 0
Unit division: 0

## 2019-10-08 LAB — BLOOD CULTURE ID PANEL (REFLEXED)

## 2019-10-08 LAB — PHOSPHORUS: Phosphorus: 3.1 mg/dL (ref 2.5–4.6)

## 2019-10-08 LAB — MRSA PCR SCREENING: MRSA by PCR: NEGATIVE

## 2019-10-08 LAB — MAGNESIUM: Magnesium: 2.2 mg/dL (ref 1.7–2.4)

## 2019-10-08 MED ORDER — SODIUM CHLORIDE 0.9 % IV SOLN
INTRAVENOUS | Status: DC
  Administered 2019-10-08 – 2019-10-28 (×30): via INTRAVENOUS

## 2019-10-08 MED ORDER — KETOROLAC TROMETHAMINE 30 MG/ML IJ SOLN
30.0000 mg | Freq: Four times a day (QID) | INTRAMUSCULAR | Status: DC | PRN
  Administered 2019-10-08 (×2): 30 mg via INTRAVENOUS
  Filled 2019-10-08 (×2): qty 1

## 2019-10-08 MED ORDER — NICOTINE 21 MG/24HR TD PT24
21.0000 mg | MEDICATED_PATCH | Freq: Every day | TRANSDERMAL | Status: DC
  Administered 2019-10-08 – 2019-10-30 (×19): 21 mg via TRANSDERMAL
  Filled 2019-10-08 (×20): qty 1

## 2019-10-08 MED ORDER — CHLORHEXIDINE GLUCONATE CLOTH 2 % EX PADS
6.0000 | MEDICATED_PAD | Freq: Every day | CUTANEOUS | Status: DC
  Administered 2019-10-08 – 2019-10-29 (×19): 6 via TOPICAL
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
## 2019-10-08 NOTE — TOC Initial Note (Signed)
Transition of Care [facility]) - Initial/Assessment Note    Patient Details  Name: Nathaniel Hansen MRN: [id number] Date of Birth: 11-12-1986  Transition of [facility]) CM/SW Contact:    Boneta Lucks, RN Phone Number: 10/08/2019, 2:15 PM  Clinical Narrative:     Patient admitted for Septic Shock, uses IV drugs.  CM consult for not insurance and no PCP.  Patient has the [facility] handout given before admission. Patient states he will call at discharge. TOC to follow for other needs.             Expected Discharge Plan: Home/Self Care Barriers to Discharge: Continued Medical Work up   Patient Goals and CMS Choice Patient states their goals for this hospitalization and ongoing recovery are:: to go home.      Expected Discharge Plan and Services Expected Discharge Plan: Home/Self Care        Activities of Daily Living Home Assistive Devices/Equipment: None ADL Screening (condition at time of admission) Patient's cognitive ability adequate to safely complete daily activities?: Yes Is the patient deaf or have difficulty hearing?: No Does the patient have difficulty seeing, even when wearing glasses/contacts?: No Does the patient have difficulty concentrating, remembering, or making decisions?: No Patient able to express need for assistance with ADLs?: Yes Does the patient have difficulty dressing or bathing?: No Independently performs ADLs?: Yes (appropriate for developmental age) Does the patient have difficulty walking or climbing stairs?: No Weakness of Legs: None Weakness of Arms/Hands: None     Emotional Assessment       Orientation: : Oriented to Self, Oriented to Place, Oriented to  Time, Oriented to Situation Alcohol / Substance Use: Illicit Drugs Psych Involvement: No (comment)  Admission diagnosis:  Septic shock ([place]) [A41.9, R65.21] Severe anemia [D64.9] Patient Active Problem List   Diagnosis Date Noted  . Septic shock ([place]) 10/07/2019  . CAP  (community acquired pneumonia) 10/07/2019  . Anemia 10/07/2019  . Lactic acidosis 10/07/2019  . Chronic back pain 10/07/2019   PCP:  Patient, No Pcp Per Pharmacy:   [facility] [place] [address] Phone: [phone] Fax: [phone]    Readmission Risk Interventions No flowsheet data found.

## 2019-10-08 NOTE — Progress Notes (Signed)
Stopped pressors, Awake and oriented and was able to eat all breakfast. Alert and oriented.

## 2019-10-09 ENCOUNTER — Inpatient Hospital Stay (HOSPITAL_COMMUNITY): Payer: Self-pay

## 2019-10-09 DIAGNOSIS — I071 Rheumatic tricuspid insufficiency: Secondary | ICD-10-CM | POA: Diagnosis present

## 2019-10-09 DIAGNOSIS — F1990 Other psychoactive substance use, unspecified, uncomplicated: Secondary | ICD-10-CM | POA: Diagnosis present

## 2019-10-09 DIAGNOSIS — I079 Rheumatic tricuspid valve disease, unspecified: Secondary | ICD-10-CM | POA: Diagnosis present

## 2019-10-09 DIAGNOSIS — R7881 Bacteremia: Secondary | ICD-10-CM

## 2019-10-09 LAB — CBC
HCT: 29.3 % — ABNORMAL LOW (ref 39.0–52.0)
Hemoglobin: 8.7 g/dL — ABNORMAL LOW (ref 13.0–17.0)
MCH: 24.1 pg — ABNORMAL LOW (ref 26.0–34.0)
MCHC: 29.7 g/dL — ABNORMAL LOW (ref 30.0–36.0)
MCV: 81.2 fL (ref 80.0–100.0)
Platelets: 420 10*3/uL — ABNORMAL HIGH (ref 150–400)
RBC: 3.61 MIL/uL — ABNORMAL LOW (ref 4.22–5.81)
RDW: 19.8 % — ABNORMAL HIGH (ref 11.5–15.5)
WBC: 18.9 10*3/uL — ABNORMAL HIGH (ref 4.0–10.5)
nRBC: 0 % (ref 0.0–0.2)

## 2019-10-09 LAB — BASIC METABOLIC PANEL
Anion gap: 9 (ref 5–15)
BUN: 16 mg/dL (ref 6–20)
CO2: 24 mmol/L (ref 22–32)
Calcium: 8.1 mg/dL — ABNORMAL LOW (ref 8.9–10.3)
Chloride: 108 mmol/L (ref 98–111)
Creatinine, Ser: 0.84 mg/dL (ref 0.61–1.24)
GFR calc Af Amer: >60 mL/min (ref >60)
GFR calc non Af Amer: >60 mL/min (ref >60)
Glucose, Bld: 114 mg/dL — ABNORMAL HIGH (ref 70–99)
Potassium: 3.6 mmol/L (ref 3.5–5.1)
Sodium: 141 mmol/L (ref 135–145)

## 2019-10-09 LAB — ECHOCARDIOGRAM COMPLETE
Height: 71 in
Weight: 2640 oz

## 2019-10-09 LAB — HIV ANTIBODY (ROUTINE TESTING W REFLEX): HIV Screen 4th Generation wRfx: NONREACTIVE — AB

## 2019-10-09 MED ORDER — METHOCARBAMOL 500 MG PO TABS: 750.0000 mg | ORAL_TABLET | Freq: Three times a day (TID) | ORAL | Status: DC

## 2019-10-09 MED ORDER — GADOBUTROL 1 MMOL/ML IV SOLN
8.0000 mL | Freq: Once | INTRAVENOUS | Status: AC | PRN
  Administered 2019-10-09: 8 mL via INTRAVENOUS

## 2019-10-09 MED ORDER — CEFAZOLIN SODIUM-DEXTROSE 2-4 GM/100ML-% IV SOLN
2.0000 g | Freq: Three times a day (TID) | INTRAVENOUS | Status: DC
  Administered 2019-10-09 – 2019-10-11 (×4): 2 g via INTRAVENOUS
  Filled 2019-10-09 (×4): qty 100

## 2019-10-09 MED ORDER — HYDROXYZINE HCL 25 MG PO TABS
25.0000 mg | ORAL_TABLET | Freq: Three times a day (TID) | ORAL | Status: DC
  Administered 2019-10-09 – 2019-10-10 (×3): 25 mg via ORAL
  Filled 2019-10-09 (×3): qty 1

## 2019-10-09 MED ORDER — GENTAMICIN SULFATE 40 MG/ML IJ SOLN
3.0000 mg/kg/d | INTRAVENOUS | Status: AC
  Administered 2019-10-09 – 2019-10-22 (×14): 220 mg via INTRAVENOUS
  Filled 2019-10-09 (×14): qty 5.5

## 2019-10-09 MED ORDER — TRAZODONE HCL 50 MG PO TABS
150.0000 mg | ORAL_TABLET | Freq: Every day | ORAL | Status: DC
  Administered 2019-10-09 – 2019-10-29 (×20): 150 mg via ORAL
  Filled 2019-10-09 (×20): qty 3

## 2019-10-09 MED ORDER — SENNOSIDES-DOCUSATE SODIUM 8.6-50 MG PO TABS
2.0000 | ORAL_TABLET | Freq: Every day | ORAL | Status: DC
  Filled 2019-10-09 (×2): qty 2

## 2019-10-09 MED ORDER — METHOCARBAMOL 500 MG PO TABS
1000.0000 mg | ORAL_TABLET | Freq: Three times a day (TID) | ORAL | Status: DC
  Administered 2019-10-09 – 2019-10-30 (×64): 1000 mg via ORAL
  Filled 2019-10-09 (×64): qty 2

## 2019-10-09 MED ORDER — TRAZODONE HCL 50 MG PO TABS: 100.0000 mg | ORAL_TABLET | Freq: Every day | ORAL | Status: DC

## 2019-10-09 NOTE — [place] FL2 (Signed)
[place] MEDICAID FL2 LEVEL OF CARE SCREENING TOOL     IDENTIFICATION  Patient Name: Nathaniel Hansen Birthdate: [birth year] Sex: male Admission Date (Current Location): 10/07/2019  [facility] and [place] Number:  [employer] and Address:  [address], [place]      Provider Number: [id number]  Attending Physician Name and Address:  Roxan Hockey, MD  Relative Name and Phone Number:       Current Level of Care: Hospital Recommended Level of Care: [place] Prior Approval Number:    Date Approved/Denied:   PASRR Number:    Discharge Plan: SNF    Current Diagnoses: Patient Active Problem List   Diagnosis Date Noted  . 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 Streptococcal Mitis/Oralis Bacteremia 10/08/2019  . Septic shock ([place]) 10/07/2019  . CAP (community acquired pneumonia) 10/07/2019  . Anemia 10/07/2019  . Lactic acidosis 10/07/2019  . Chronic back pain 10/07/2019    Orientation RESPIRATION BLADDER Height & Weight     Self, Time, Situation, Place  Normal Continent Weight: 165 lb (74.8 kg) Height:  5\' 11"  (180.3 cm)  BEHAVIORAL SYMPTOMS/MOOD NEUROLOGICAL BOWEL NUTRITION STATUS      Continent Diet(see dc summary)  AMBULATORY STATUS COMMUNICATION OF NEEDS Skin   Independent Verbally Normal                       Personal Care Assistance Level of Assistance  Bathing, Feeding, Dressing Bathing Assistance: Independent Feeding assistance: Independent Dressing Assistance: Independent     Functional Limitations Info  Sight, Hearing, Speech Sight Info: Adequate Hearing Info: Adequate Speech Info: Adequate    SPECIAL CARE FACTORS FREQUENCY                       Contractures  Contractures Info: Not present    Additional Factors Info  Code Status, Allergies Code Status Info: full Allergies Info: NKA           Current Medications (10/09/2019):  This is the current hospital active medication list Current Facility-Administered Medications  Medication Dose Route Frequency Provider Last Rate Last Dose  . 0.9 %  sodium chloride infusion   Intravenous Continuous Kathie Dike, MD 100 mL/hr at 10/09/19 1040    . acetaminophen (TYLENOL) tablet 650 mg  650 mg Oral Q4H PRN Kathie Dike, MD      . ceFAZolin (ANCEF) IVPB 2g/100 mL premix  2 g Intravenous Q8H Emokpae, Courage, MD      . Chlorhexidine Gluconate Cloth 2 % PADS 6 each  6 each Topical Daily Kathie Dike, MD   6 each at 10/08/19 0800  . gentamicin (GARAMYCIN) 220 mg in dextrose 5 % 100 mL IVPB  3 mg/kg/day Intravenous Q24H Emokpae, Courage, MD      . hydrOXYzine (ATARAX/VISTARIL) tablet 25 mg  25 mg Oral TID Emokpae, Courage, MD      . ketorolac (TORADOL) 30 MG/ML injection 30 mg  30 mg Intravenous Q6H PRN Kathie Dike, MD   30 mg at 10/08/19 2132  . methocarbamol (ROBAXIN) tablet 1,000 mg  1,000 mg Oral TID Emokpae, Courage, MD      . nicotine (NICODERM CQ - dosed in mg/24 hours) patch 21 mg  21 mg Transdermal Daily Kathie Dike, MD   21 mg at  10/08/19 0908  . ondansetron (ZOFRAN) injection 4 mg  4 mg Intravenous Q6H PRN Erick Blinks, MD      . senna-docusate (Senokot-S) tablet 2 tablet  2 tablet Oral QHS Emokpae, Courage, MD      . traZODone (DESYREL) tablet 150 mg  150 mg Oral QHS Shon Hale, MD         Discharge Medications: Please see discharge summary for a list of discharge medications.  Relevant Imaging Results:  Relevant Lab Results:   Additional Information IV anbx, no insurance  Elliot Gault, LCSW
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
## 2019-10-09 NOTE — Progress Notes (Signed)
*  PRELIMINARY RESULTS* Echocardiogram 2D Echocardiogram has been performed.  Leavy Cella 10/09/2019, 11:05 AM

## 2019-10-09 NOTE — Progress Notes (Signed)
Pharmacy Antibiotic Note  Nathaniel Hansen is a 33 y.o. male admitted on 10/07/2019 with streptococcus bacteremia/endocarditis.  Pharmacy has been consulted for Cefazolin and Gentmicin dosing. BCx + streptococcus mitis and 2D ECHO is positive for vegetation. Dr. Denton Brick d/w ID and cefazolin 4-6 weeks and gentamicin for synergy for 2 weeks. Patient is now afebrile. WBC elevated but had solucortef x 3 doses. Patient even wanting to go home.  Plan: Cefazolin 2gm IV q8h Gentamicin 3mg /kg/day once daily dosing 220mg  for 2 weeks F/U cxs and clinical progress Monitor V/S, labs and levels as indicated  Height: 5\' 11"  (180.3 cm) Weight: 165 lb (74.8 kg) IBW/kg (Calculated) : 75.3  Temp (24hrs), Avg:97.9 F (36.6 C), Min:97.6 F (36.4 C), Max:98.5 F (36.9 C)  Recent Labs  Lab 10/07/19 1126 10/07/19 1249 10/08/19 0451 10/09/19 0431  WBC 7.1  --  17.0* 18.9*  CREATININE 0.94  --  0.88 0.84  LATICACIDVEN 2.5* 1.1  --   --     Estimated Creatinine Clearance: 134.8 mL/min (by C-G formula based on SCr of 0.84 mg/dL).    No Known Allergies  Antimicrobials this admission: Vancomycin 11/16>>11/8 Ceftriaxone 11/16 >>11/18 Azithromycin 11/16>> 11/18 Cefazolin 11/18>> Gentamicin 11/18>> for 2 weeks for synergy   Dose adjustments this admission: n/a   Microbiology results: 11/16 BCx: streptococcus mitis 11/16 UCx: no growth  11/16 MRSA PCR: negative 11/16 SARS-2 CV is negative   Thank you for allowing pharmacy to be a part of this patient's care.  Isac Sarna, BS [place], [place] Clinical Pharmacist Pager [phone] 10/09/2019 2:39 PM

## 2019-10-09 NOTE — Consult Note (Signed)
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 Chlorhexidine Gluconate Cloth  6 each Topical Daily  . hydrOXYzine  25 mg Oral TID  . methocarbamol  1,000 mg Oral TID  . nicotine  21 mg Transdermal Daily  . senna-docusate  2 tablet Oral QHS  . traZODone  150 mg Oral QHS    Recommendations: 1. Change ctx/azithromycin to cefazolin 2 gm IV q 8 hrs with synergistic gentamicin (dosed for a goal gent trough of < 1 and goal gent peak of 3).  2. Repeat blood cxs x 2 to assess for clearance 3. If the patient clears his BSI promptly, he remains hemodynamically stable throughout remainder of treatment, AND has repeat TTE showing resolution of his large 1.2 x 1.2 tricuspid vegetation after 2 weeks of combination treatment (on 10/23/2019), he may be considered for 2 weeks of treatment in total given pathogen involved. Given the size of his vegetation though, would anticipate 4 weeks of cefazolin with initial 2 weeks of synergistic gentamicin for total treatment.  4. Due to patient's active IVDU as the cause of his infection, would NOT view him as a candidate for a PICC line as an OP and would anticipate completion of his ABX course as an inpt for full duration of tx.  Assessment: Patient is a 33 year old African-American male IV drug user admitted with fever, leukocytosis, and streptococcal sepsis/Strep viridans tricuspid endocarditis.  Antibiotics: Ceftriaxone, day 2 Azithromycin, day 2  HPI: Nathaniel Hansen is a 33 y.o. AA male IVDUer admitted with fever, leukocytosis, and Strep viridans bacteremia. Strep oralis/mitis has been confirmed on both admitting blood cxs. Pt admits to recent IVDU PTA. TTE performed today showed an EF of 60-65% and 1.2 cm x 1.2 cm vegetation on the septal leaflet of his tricuspid valve with moderate-severe tricuspid regurgitation. MRI of his lumbar and cervical spine showed no evidence of discitis or epidural abscess. He was empirically started on rocephin/azithromycin upon admission. Following my discussion with the hospitalist today, his ABX will be changed to cefazolin 2 gm IV q 8 hrs and synergistic gentamicin today. Note: This is a virtual consult for a patient at [facility] where our service does not physically round. Efforts to speak to the patient on the phone were unsuccessful, so history was gathered via electronic chart review and discussion with the consulting hospitalist today. Fever curve, WBC & Cr trends, imaging, cx results, and ABX usage all independently reviewed  Review of Systems: Unable to be obtained as this is virtual consult     Past Medical History:  Diagnosis Date  . Asthma   . Opioid abuse (HCC)     Social History   Tobacco Use  . Smoking status: Current Every Day Smoker    Packs/day: 1.00    Types: Cigarettes  . Smokeless tobacco: Never Used  Substance Use Topics  . Alcohol use: Yes    Frequency: Never    Comment: stopped alcohol about 3 month ago   . Drug use: Yes  Types: Marijuana, Heroin    Comment: "sometimes" opioids, heroin use about a month and a half ago     History reviewed. No pertinent family history.   Current Facility-Administered Medications:  .  0.9 %  sodium chloride infusion, , Intravenous, Continuous, Memon, Jehanzeb, MD, Last Rate: 100 mL/hr at 10/09/19 2217 .  acetaminophen (TYLENOL) tablet 650 mg, 650 mg, Oral, Q4H PRN, Erick Blinks, MD .  ceFAZolin (ANCEF) IVPB 2g/100 mL premix, 2 g,  Intravenous, Q8H, Emokpae, Courage, MD, Last Rate: 200 mL/hr at 10/09/19 2218, 2 g at 10/09/19 2218 .  Chlorhexidine Gluconate Cloth 2 % PADS 6 each, 6 each, Topical, Daily, Erick Blinks, MD, 6 each at 10/08/19 0800 .  gentamicin (GARAMYCIN) 220 mg in dextrose 5 % 100 mL IVPB, 3 mg/kg/day, Intravenous, Q24H, Emokpae, Courage, MD, Last Rate: 105.5 mL/hr at 10/09/19 1544, 220 mg at 10/09/19 1544 .  hydrOXYzine (ATARAX/VISTARIL) tablet 25 mg, 25 mg, Oral, TID, Emokpae, Courage, MD, 25 mg at 10/09/19 2218 .  ketorolac (TORADOL) 30 MG/ML injection 30 mg, 30 mg, Intravenous, Q6H PRN, Erick Blinks, MD, 30 mg at 10/08/19 2132 .  methocarbamol (ROBAXIN) tablet 1,000 mg, 1,000 mg, Oral, TID, Emokpae, Courage, MD, 1,000 mg at 10/09/19 2218 .  nicotine (NICODERM CQ - dosed in mg/24 hours) patch 21 mg, 21 mg, Transdermal, Daily, Memon, Durward Mallard, MD, 21 mg at 10/08/19 0908 .  ondansetron (ZOFRAN) injection 4 mg, 4 mg, Intravenous, Q6H PRN, Erick Blinks, MD .  senna-docusate (Senokot-S) tablet 2 tablet, 2 tablet, Oral, QHS, Emokpae, Courage, MD .  traZODone (DESYREL) tablet 150 mg, 150 mg, Oral, QHS, Emokpae, Courage, MD, 150 mg at 10/09/19 2218  No Known Allergies  Vitals:   10/09/19 1600 10/09/19 1726  BP: 114/68   Pulse:    Resp: 17   Temp:  98.7 F (37.1 C)  SpO2:       Physical Exam Unable to be performed as consult was performed remotely and patient was not present for exam.  Lab Results  Component Value Date   WBC 18.9 (H) 10/09/2019   HGB 8.7 (L) 10/09/2019   HCT 29.3 (L) 10/09/2019   MCV 81.2 10/09/2019   PLT 420 (H) 10/09/2019    Lab Results  Component Value Date   CREATININE 0.84 10/09/2019   BUN 16 10/09/2019   NA 141 10/09/2019   K 3.6 10/09/2019   CL 108 10/09/2019   CO2 24 10/09/2019    Lab Results  Component Value Date   ALT 10 10/07/2019   AST 9 (L) 10/07/2019   ALKPHOS 73 10/07/2019     Microbiology: Recent Results (from the past 240 hour(s))  SARS  CORONAVIRUS 2 (TAT 6-24 HRS) Nasopharyngeal Nasopharyngeal Swab     Status: None   Collection Time: 10/07/19 10:50 AM   Specimen: Nasopharyngeal Swab  Result Value Ref Range Status   SARS Coronavirus 2 NEGATIVE NEGATIVE Final    Comment: (NOTE) SARS-CoV-2 target nucleic acids are NOT DETECTED. The SARS-CoV-2 RNA is generally detectable in upper and lower respiratory specimens during the acute phase of infection. Negative results do not preclude SARS-CoV-2 infection, do not rule out co-infections with other pathogens, and should not be used as the sole basis for treatment or other patient management decisions. Negative results must be combined with clinical observations, patient history, and epidemiological information. The expected result is Negative. Fact Sheet for Patients: HairSlick.no Fact Sheet for Healthcare Providers: quierodirigir.com This test is not yet approved or cleared by the [place] FDA  and  has been authorized for detection and/or diagnosis of SARS-CoV-2 by FDA under an Emergency Use Authorization (EUA). This EUA will remain  in effect (meaning this test can be used) for the duration of the COVID-19 declaration under Section 56 4(b)(1) of the Act, 21 U.S.C. section 360bbb-3(b)(1), unless the authorization is terminated or revoked sooner. Performed at [facility], [address] [place], [place] [address]   Blood Culture (routine x 2)     Status: Abnormal (Preliminary result)   Collection Time: 10/07/19 10:54 AM   Specimen: BLOOD LEFT HAND  Result Value Ref Range Status   Specimen Description   Final    BLOOD LEFT HAND Performed at [facility], [address] [place], [place] [address]    Special Requests   Final    BOTTLES DRAWN AEROBIC AND ANAEROBIC Blood Culture results may not be optimal due to an excessive volume of blood received in culture bottles Performed at [facility], [address] [place], [place] [address]    Culture  Setup Time   Final    IN BOTH AEROBIC AND ANAEROBIC BOTTLES GRAM POSITIVE COCCI IN CHAINS Gram Stain Report Called to,Read Back By and Verified With: S WADE,RN @0317  10/08/19 MKELLY CRITICAL VALUE NOTED.  VALUE IS CONSISTENT WITH PREVIOUSLY REPORTED AND CALLED VALUE.    Culture (A)  Final    STREPTOCOCCUS MITIS/ORALIS SUSCEPTIBILITIES TO FOLLOW Performed at [facility], [address] [place], [place] [address]    Report Status PENDING  Incomplete  Urine culture     Status: None   Collection Time: 10/07/19 10:56 AM   Specimen: Urine, Clean Catch  Result Value Ref Range Status   Specimen Description   Final    URINE, CLEAN CATCH Performed at [facility], [address] [place], [place] [address]    Special Requests   Final    NONE Performed at [facility], [address] [place], [place] [address]    Culture   Final    NO GROWTH Performed at [facility], [address] [place], [place] [address]    Report Status 10/08/2019 FINAL  Final  Blood Culture (routine x 2)     Status: Abnormal (Preliminary result)   Collection Time: 10/07/19 11:26 AM   Specimen: BLOOD LEFT ARM  Result Value Ref Range Status   Specimen Description   Final    BLOOD LEFT ARM Performed at [facility], [address] [place], [place] [address]    Special Requests   Final    BOTTLES DRAWN AEROBIC AND ANAEROBIC Blood Culture results may not be optimal due to an excessive volume of blood received in culture bottles Performed at [facility], [address] [place], [place] [address]    Culture  Setup Time   Final    IN BOTH AEROBIC AND ANAEROBIC BOTTLES GRAM POSITIVE COCCI IN CHAINS Gram Stain Report Called to,Read Back By and Verified With: S WADE,RN @0316  10/08/19 MKELLY CRITICAL RESULT CALLED TO, READ BACK BY AND VERIFIED WITH: PHARMD D COFFEE [id number] AT 811 AM BY CM    Culture (A)  Final    STREPTOCOCCUS MITIS/ORALIS SUSCEPTIBILITIES TO  FOLLOW Performed at [facility], [address] [place], [place] [address]    Report Status PENDING  Incomplete  Blood Culture ID Panel (Reflexed)     Status: None   Collection Time: 10/07/19 11:26 AM  Result Value Ref Range Status   Enterococcus species NOT DETECTED  NOT DETECTED Final   Listeria monocytogenes NOT DETECTED NOT DETECTED Final   Staphylococcus species NOT DETECTED NOT DETECTED Final   Staphylococcus aureus (BCID) NOT DETECTED NOT DETECTED Final   Streptococcus species NOT DETECTED NOT DETECTED Final   Streptococcus agalactiae NOT DETECTED NOT DETECTED Final   Streptococcus pneumoniae NOT DETECTED NOT DETECTED Final   Streptococcus pyogenes NOT DETECTED NOT DETECTED Final   Acinetobacter baumannii NOT DETECTED NOT DETECTED Final   Enterobacteriaceae species NOT DETECTED NOT DETECTED Final   Enterobacter cloacae complex NOT DETECTED NOT DETECTED Final   Escherichia coli NOT DETECTED NOT DETECTED Final   Klebsiella oxytoca NOT DETECTED NOT DETECTED Final   Klebsiella pneumoniae NOT DETECTED NOT DETECTED Final   Proteus species NOT DETECTED NOT DETECTED Final   Serratia marcescens NOT DETECTED NOT DETECTED Final   Haemophilus influenzae NOT DETECTED NOT DETECTED Final   Neisseria meningitidis NOT DETECTED NOT DETECTED Final   Pseudomonas aeruginosa NOT DETECTED NOT DETECTED Final   Candida albicans NOT DETECTED NOT DETECTED Final   Candida glabrata NOT DETECTED NOT DETECTED Final   Candida krusei NOT DETECTED NOT DETECTED Final   Candida parapsilosis NOT DETECTED NOT DETECTED Final   Candida tropicalis NOT DETECTED NOT DETECTED Final    Comment: Performed at [facility], [address], [place], [place] [address]  MRSA PCR Screening     Status: None   Collection Time: 10/07/19  9:37 PM   Specimen: Nasal Mucosa; Nasopharyngeal  Result Value Ref Range Status   MRSA by PCR NEGATIVE NEGATIVE Final    Comment:        The GeneXpert MRSA Assay (FDA approved  for NASAL specimens only), is one component of a comprehensive MRSA colonization surveillance program. It is not intended to diagnose MRSA infection nor to guide or monitor treatment for MRSA infections. Performed at [facility], [address], [place], [place] [address]   Culture, blood (routine x 2)     Status: None (Preliminary result)   Collection Time: 10/07/19  9:54 PM   Specimen: BLOOD RIGHT FOREARM  Result Value Ref Range Status   Specimen Description BLOOD RIGHT FOREARM  Final   Special Requests   Final    BOTTLES DRAWN AEROBIC AND ANAEROBIC Blood Culture adequate volume   Culture   Final    NO GROWTH 2 DAYS Performed at [facility], [address] [place], [place] [address]    Report Status PENDING  Incomplete  Culture, blood (routine x 2)     Status: None (Preliminary result)   Collection Time: 10/07/19  9:55 PM   Specimen: BLOOD  Result Value Ref Range Status   Specimen Description BLOOD NECK LINE  Final   Special Requests   Final    BOTTLES DRAWN AEROBIC AND ANAEROBIC Blood Culture adequate volume   Culture   Final    NO GROWTH 2 DAYS Performed at [facility], [address] [place],  [address]    Report Status PENDING  Incomplete    Janine Ores, [facility] [email or web address] 10/09/2019, 10:27 PM

## 2019-10-10 LAB — CBC
HCT: 26.6 % — ABNORMAL LOW (ref 39.0–52.0)
Hemoglobin: 7.9 g/dL — ABNORMAL LOW (ref 13.0–17.0)
MCH: 23.4 pg — ABNORMAL LOW (ref 26.0–34.0)
MCHC: 29.7 g/dL — ABNORMAL LOW (ref 30.0–36.0)
MCV: 78.9 fL — ABNORMAL LOW (ref 80.0–100.0)
Platelets: 395 10*3/uL (ref 150–400)
RBC: 3.37 MIL/uL — ABNORMAL LOW (ref 4.22–5.81)
RDW: 20.9 % — ABNORMAL HIGH (ref 11.5–15.5)
WBC: 14.7 10*3/uL — ABNORMAL HIGH (ref 4.0–10.5)
nRBC: 0 % (ref 0.0–0.2)

## 2019-10-10 LAB — BASIC METABOLIC PANEL
Anion gap: 7 (ref 5–15)
BUN: 9 mg/dL (ref 6–20)
CO2: 22 mmol/L (ref 22–32)
Calcium: 7.7 mg/dL — ABNORMAL LOW (ref 8.9–10.3)
Chloride: 110 mmol/L (ref 98–111)
Creatinine, Ser: 0.97 mg/dL (ref 0.61–1.24)
GFR calc Af Amer: >60 mL/min (ref >60)
GFR calc non Af Amer: >60 mL/min (ref >60)
Glucose, Bld: 87 mg/dL (ref 70–99)
Potassium: 3.3 mmol/L — ABNORMAL LOW (ref 3.5–5.1)
Sodium: 139 mmol/L (ref 135–145)

## 2019-10-10 LAB — CULTURE, BLOOD (ROUTINE X 2)

## 2019-10-10 MED ORDER — HYDROXYZINE HCL 25 MG PO TABS
50.0000 mg | ORAL_TABLET | Freq: Three times a day (TID) | ORAL | Status: DC
  Administered 2019-10-10 – 2019-10-30 (×61): 50 mg via ORAL
  Filled 2019-10-10 (×61): qty 2

## 2019-10-10 MED ORDER — POTASSIUM CHLORIDE CRYS ER 20 MEQ PO TBCR
40.0000 meq | EXTENDED_RELEASE_TABLET | ORAL | Status: AC
  Administered 2019-10-10: 40 meq via ORAL
  Filled 2019-10-10 (×2): qty 2
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
## 2019-10-11 LAB — CBC
HCT: 25.8 % — ABNORMAL LOW (ref 39.0–52.0)
Hemoglobin: 7.9 g/dL — ABNORMAL LOW (ref 13.0–17.0)
MCH: 24.2 pg — ABNORMAL LOW (ref 26.0–34.0)
MCHC: 30.6 g/dL (ref 30.0–36.0)
MCV: 79.1 fL — ABNORMAL LOW (ref 80.0–100.0)
Platelets: 331 10*3/uL (ref 150–400)
RBC: 3.26 MIL/uL — ABNORMAL LOW (ref 4.22–5.81)
RDW: 20.9 % — ABNORMAL HIGH (ref 11.5–15.5)
WBC: 16.9 10*3/uL — ABNORMAL HIGH (ref 4.0–10.5)
nRBC: 0 % (ref 0.0–0.2)

## 2019-10-11 LAB — CREATININE, SERUM
Creatinine, Ser: 0.85 mg/dL (ref 0.61–1.24)
GFR calc Af Amer: >60 mL/min (ref >60)
GFR calc non Af Amer: >60 mL/min (ref >60)

## 2019-10-11 MED ORDER — KETOROLAC TROMETHAMINE 30 MG/ML IJ SOLN
30.0000 mg | Freq: Once | INTRAMUSCULAR | Status: AC
  Administered 2019-10-11: 30 mg via INTRAVENOUS
  Filled 2019-10-11: qty 1

## 2019-10-11 MED ORDER — GABAPENTIN 300 MG PO CAPS
300.0000 mg | ORAL_CAPSULE | Freq: Three times a day (TID) | ORAL | Status: DC
  Administered 2019-10-11 – 2019-10-12 (×4): 300 mg via ORAL
  Filled 2019-10-11 (×4): qty 1

## 2019-10-11 MED ORDER — LACTULOSE 10 GM/15ML PO SOLN
30.0000 g | Freq: Once | ORAL | Status: DC
  Filled 2019-10-11: qty 60

## 2019-10-11 MED ORDER — PENICILLIN G POTASSIUM 20000000 UNITS IJ SOLR
9.0000 10*6.[IU] | Freq: Two times a day (BID) | INTRAVENOUS | Status: DC
  Administered 2019-10-11 – 2019-10-29 (×38): 9 10*6.[IU] via INTRAVENOUS
  Filled 2019-10-11 (×42): qty 9

## 2019-10-11 MED ORDER — PENICILLIN G POTASSIUM 20000000 UNITS IJ SOLR
9.0000 10*6.[IU] | Freq: Two times a day (BID) | INTRAVENOUS | Status: DC
  Filled 2019-10-11 (×7): qty 9

## 2019-10-11 MED ORDER — SENNOSIDES-DOCUSATE SODIUM 8.6-50 MG PO TABS
2.0000 | ORAL_TABLET | Freq: Two times a day (BID) | ORAL | Status: DC
  Administered 2019-10-11 – 2019-10-30 (×7): 2 via ORAL
  Filled 2019-10-11 (×26): qty 2
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
## 2019-10-11 NOTE — Progress Notes (Signed)
Pharmacy Antibiotic Note  Nathaniel Hansen is a 33 y.o. male admitted on 10/07/2019 with streptococcus bacteremia/endocarditis.  Pharmacy has been consulted for Penicillin G and Gentmicin dosing. BCx + streptococcus mitis and 2D ECHO is positive for vegetation.  ID2 weeks of penicillin G and gentamicin for synergy  Patient is now afebrile. WBC elevated   Per ID, can change cefazolin to penicillin G  Plan: Change cefazolin to penicillin G IV 18 million units continuous infusion Gentamicin 3mg /kg/day once daily dosing 220mg  for 2 weeks F/U cxs and clinical progress Monitor V/S, labs and levels as indicated  Height: 5\' 11"  (180.3 cm) Weight: 180 lb 8.9 oz (81.9 kg) IBW/kg (Calculated) : 75.3  Temp (24hrs), Avg:99.7 F (37.6 C), Min:98.2 F (36.8 C), Max:102.1 F (38.9 C)  Recent Labs  Lab 10/07/19 1126 10/07/19 1249 10/08/19 0451 10/09/19 0431 10/10/19 0405 10/11/19 0421  WBC 7.1  --  17.0* 18.9* 14.7* 16.9*  CREATININE 0.94  --  0.88 0.84 0.97 0.85  LATICACIDVEN 2.5* 1.1  --   --   --   --     Estimated Creatinine Clearance: 134.1 mL/min (by C-G formula based on SCr of 0.85 mg/dL).    No Known Allergies  Antimicrobials this admission: Vancomycin 11/16>>11/8 Ceftriaxone 11/16 >>11/18 Azithromycin 11/16>> 11/18 Cefazolin 11/18>> 11/20 Penicillin G 11/20 >> Gentamicin 11/18>> for 2 weeks for synergy   Dose adjustments this admission: n/a   Microbiology results: 11/16 BCx: streptococcus mitis 11/16 UCx: no growth  11/16 MRSA PCR: negative 11/16 SARS-2 CV is negative   Thank you for allowing pharmacy to be a part of this patient's care.  Margot Ables, PharmD Clinical Pharmacist 10/11/2019 10:30 AM

## 2019-10-12 ENCOUNTER — Inpatient Hospital Stay (HOSPITAL_COMMUNITY): Payer: Self-pay

## 2019-10-12 LAB — CULTURE, BLOOD (ROUTINE X 2)
Culture: NO GROWTH
Culture: NO GROWTH
Special Requests: ADEQUATE
Special Requests: ADEQUATE

## 2019-10-12 LAB — CBC
HCT: 26.5 % — ABNORMAL LOW (ref 39.0–52.0)
Hemoglobin: 7.8 g/dL — ABNORMAL LOW (ref 13.0–17.0)
MCH: 23.2 pg — ABNORMAL LOW (ref 26.0–34.0)
MCHC: 29.4 g/dL — ABNORMAL LOW (ref 30.0–36.0)
MCV: 78.9 fL — ABNORMAL LOW (ref 80.0–100.0)
Platelets: 355 10*3/uL (ref 150–400)
RBC: 3.36 MIL/uL — ABNORMAL LOW (ref 4.22–5.81)
RDW: 21.2 % — ABNORMAL HIGH (ref 11.5–15.5)
WBC: 13.7 10*3/uL — ABNORMAL HIGH (ref 4.0–10.5)
nRBC: 0 % (ref 0.0–0.2)

## 2019-10-12 LAB — BASIC METABOLIC PANEL
Anion gap: 9 (ref 5–15)
BUN: 10 mg/dL (ref 6–20)
CO2: 23 mmol/L (ref 22–32)
Calcium: 8.2 mg/dL — ABNORMAL LOW (ref 8.9–10.3)
Chloride: 106 mmol/L (ref 98–111)
Creatinine, Ser: 0.95 mg/dL (ref 0.61–1.24)
GFR calc Af Amer: >60 mL/min (ref >60)
GFR calc non Af Amer: >60 mL/min (ref >60)
Glucose, Bld: 95 mg/dL (ref 70–99)
Potassium: 4.2 mmol/L (ref 3.5–5.1)
Sodium: 138 mmol/L (ref 135–145)

## 2019-10-12 MED ORDER — GABAPENTIN 400 MG PO CAPS
400.0000 mg | ORAL_CAPSULE | Freq: Three times a day (TID) | ORAL | Status: DC
  Administered 2019-10-12 – 2019-10-13 (×4): 400 mg via ORAL
  Filled 2019-10-12 (×4): qty 1

## 2019-10-12 MED ORDER — KETOROLAC TROMETHAMINE 30 MG/ML IJ SOLN
30.0000 mg | Freq: Once | INTRAMUSCULAR | Status: AC
  Administered 2019-10-12: 30 mg via INTRAVENOUS
  Filled 2019-10-12: qty 1

## 2019-10-12 MED ORDER — ACETAMINOPHEN 500 MG PO TABS
1000.0000 mg | ORAL_TABLET | Freq: Once | ORAL | Status: AC
  Administered 2019-10-12: 1000 mg via ORAL
  Filled 2019-10-12: qty 2

## 2019-10-12 MED ORDER — LACTULOSE 10 GM/15ML PO SOLN
30.0000 g | Freq: Once | ORAL | Status: AC
  Administered 2019-10-12: 30 g via ORAL
  Filled 2019-10-12: qty 60

## 2019-10-12 MED ORDER — LEVOFLOXACIN IN D5W 750 MG/150ML IV SOLN
750.0000 mg | INTRAVENOUS | Status: DC
  Administered 2019-10-12 – 2019-10-14 (×3): 750 mg via INTRAVENOUS
  Filled 2019-10-12 (×3): qty 150
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
## 2019-10-13 LAB — GENTAMICIN LEVEL, TROUGH: Gentamicin Trough: <0.5 ug/mL — ABNORMAL LOW (ref 0.5–2.0)

## 2019-10-13 MED ORDER — GABAPENTIN 300 MG PO CAPS
600.0000 mg | ORAL_CAPSULE | Freq: Three times a day (TID) | ORAL | Status: DC
  Administered 2019-10-13 – 2019-10-17 (×11): 600 mg via ORAL
  Filled 2019-10-13 (×11): qty 2

## 2019-10-13 MED ORDER — KETOROLAC TROMETHAMINE 30 MG/ML IJ SOLN
30.0000 mg | Freq: Once | INTRAMUSCULAR | Status: AC
  Administered 2019-10-13: 30 mg via INTRAVENOUS
  Filled 2019-10-13: qty 1

## 2019-10-13 MED ORDER — LACTULOSE 10 GM/15ML PO SOLN
30.0000 g | Freq: Once | ORAL | Status: DC
  Filled 2019-10-13: qty 60

## 2019-10-13 MED ORDER — POLYETHYLENE GLYCOL 3350 17 G PO PACK: 17.0000 g | PACK | Freq: Once | ORAL | Status: DC
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
## 2019-10-13 NOTE — Progress Notes (Signed)
Gentamicin level drawn and is a send out lab test per lab tech. Unable to give 4pm dose since level is not resulted per care order from pharmacy. Per lab level may not result til in the morning.  MD made aware. Will continue to monitor.

## 2019-10-13 NOTE — Progress Notes (Signed)
Thad from pharmacy called to state to give pts 5pm dose of Gentamycin. Pharmacy stated that if a dose needs to be changed, he will do it in am when trough results.

## 2019-10-14 LAB — CULTURE, BLOOD (ROUTINE X 2)
Culture: NO GROWTH
Culture: NO GROWTH
Special Requests: ADEQUATE
Special Requests: ADEQUATE

## 2019-10-14 LAB — COMPREHENSIVE METABOLIC PANEL
ALT: 18 U/L (ref 0–44)
AST: 19 U/L (ref 15–41)
Albumin: 1.8 g/dL — ABNORMAL LOW (ref 3.5–5.0)
Alkaline Phosphatase: 90 U/L (ref 38–126)
Anion gap: 10 (ref 5–15)
BUN: 13 mg/dL (ref 6–20)
CO2: 25 mmol/L (ref 22–32)
Calcium: 8.3 mg/dL — ABNORMAL LOW (ref 8.9–10.3)
Chloride: 103 mmol/L (ref 98–111)
Creatinine, Ser: 1.21 mg/dL (ref 0.61–1.24)
GFR calc Af Amer: >60 mL/min (ref >60)
GFR calc non Af Amer: >60 mL/min (ref >60)
Glucose, Bld: 100 mg/dL — ABNORMAL HIGH (ref 70–99)
Potassium: 4.3 mmol/L (ref 3.5–5.1)
Sodium: 138 mmol/L (ref 135–145)
Total Bilirubin: 0.6 mg/dL (ref 0.3–1.2)
Total Protein: 6.7 g/dL (ref 6.5–8.1)

## 2019-10-14 LAB — CBC
HCT: 22.9 % — ABNORMAL LOW (ref 39.0–52.0)
Hemoglobin: 6.8 g/dL — CL (ref 13.0–17.0)
MCH: 23.4 pg — ABNORMAL LOW (ref 26.0–34.0)
MCHC: 29.7 g/dL — ABNORMAL LOW (ref 30.0–36.0)
MCV: 78.7 fL — ABNORMAL LOW (ref 80.0–100.0)
Platelets: 435 10*3/uL — ABNORMAL HIGH (ref 150–400)
RBC: 2.91 MIL/uL — ABNORMAL LOW (ref 4.22–5.81)
RDW: 21.4 % — ABNORMAL HIGH (ref 11.5–15.5)
WBC: 8.7 10*3/uL (ref 4.0–10.5)
nRBC: 0 % (ref 0.0–0.2)

## 2019-10-14 LAB — PREPARE RBC (CROSSMATCH)

## 2019-10-14 LAB — HEMOGLOBIN AND HEMATOCRIT, BLOOD
HCT: 25.6 % — ABNORMAL LOW (ref 39.0–52.0)
Hemoglobin: 8 g/dL — ABNORMAL LOW (ref 13.0–17.0)

## 2019-10-14 MED ORDER — LACTULOSE 10 GM/15ML PO SOLN
30.0000 g | Freq: Once | ORAL | Status: AC
  Administered 2019-10-14: 30 g via ORAL
  Filled 2019-10-14: qty 60

## 2019-10-14 MED ORDER — SODIUM CHLORIDE 0.9% IV SOLUTION
Freq: Once | INTRAVENOUS | Status: AC
  Administered 2019-10-14: 13:00:00 via INTRAVENOUS

## 2019-10-14 NOTE — Progress Notes (Signed)
Tylenol given for temperature of 102.1- will continue to monitor pt.
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
## 2019-10-14 NOTE — Progress Notes (Signed)
A. Zierle-Ghosh ordered 1 unit PRBC after being paged about critical HGB 6.8. paged to make aware a Type & Screen is needed to give blood. Pt had blood 10/07/19 and need new type & screen q3 days. Waiting for order/call back.

## 2019-10-14 NOTE — Progress Notes (Signed)
MD updated on pt's temperatures. Will continue to monitor.

## 2019-10-14 NOTE — Progress Notes (Signed)
Dr. Darrell Jewel notified of Critical Lab value Hgb 6.8.

## 2019-10-14 NOTE — Progress Notes (Signed)
Pharmacy Antibiotic Note  Nathaniel Hansen is a 33 y.o. male admitted on 10/07/2019 with streptococcus bacteremia/endocarditis.  Pharmacy has been consulted for Penicillin G and Gentmicin dosing. BCx + streptococcus mitis and 2D ECHO is positive for vegetation.  ID2 weeks of penicillin G and gentamicin for synergy  Levaquin added for CAP.   Plan: Gentamicin trough <0.5. continue current regimen.  Continue penicillin G IV 18 million units continuous infusion Gentamicin 3mg /kg/day once daily dosing 220mg  for 2 weeks F/U cxs and clinical progress Monitor V/S, labs and levels as indicated  Height: 5\' 11"  (180.3 cm) Weight: 181 lb (82.1 kg) IBW/kg (Calculated) : 75.3  Temp (24hrs), Avg:100.4 F (38 C), Min:98.7 F (37.1 C), Max:102.9 F (39.4 C)  Recent Labs  Lab 10/07/19 1126 10/07/19 1249  10/09/19 0431 10/10/19 0405 10/11/19 0421 10/12/19 0454 10/13/19 1642 10/14/19 0414  WBC 7.1  --    < > 18.9* 14.7* 16.9* 13.7*  --  8.7  CREATININE 0.94  --    < > 0.84 0.97 0.85 0.95  --  1.21  LATICACIDVEN 2.5* 1.1  --   --   --   --   --   --   --   GENTTROUGH  --   --   --   --   --   --   --  <0.5*  --    < > = values in this interval not displayed.    Estimated Creatinine Clearance: 94.2 mL/min (by C-G formula based on SCr of 1.21 mg/dL).    No Known Allergies  Antimicrobials this admission: Vancomycin 11/16>>11/8 Ceftriaxone 11/16 >>11/18 Azithromycin 11/16>> 11/18 Cefazolin 11/18>> 11/20 Penicillin G 11/20 >> Gentamicin 11/18>> for 2 weeks for synergy Levaquin 11/21 >>    Dose adjustments this admission: n/a   Microbiology results: 11/22 Bcx: ngtd 11/18 Bcx: ng 11/16 BCx: streptococcus mitis 11/16 UCx: no growth  MRSA PCR: negative   Thank you for allowing pharmacy to be a part of this patient's care.  Margot Ables, PharmD Clinical Pharmacist 10/14/2019 8:46 AM

## 2019-10-15 LAB — TYPE AND SCREEN
ABO/RH(D): O POS
Antibody Screen: NEGATIVE
Unit division: 0

## 2019-10-15 LAB — BPAM RBC
Blood Product Expiration Date: 202012202359
ISSUE DATE / TIME: 202011231220
Unit Type and Rh: 5100

## 2019-10-15 LAB — OCCULT BLOOD X 1 CARD TO LAB, STOOL: Fecal Occult Bld: NEGATIVE

## 2019-10-15 MED ORDER — LEVOFLOXACIN 750 MG PO TABS
750.0000 mg | ORAL_TABLET | Freq: Every day | ORAL | Status: AC
  Administered 2019-10-15 – 2019-10-18 (×4): 750 mg via ORAL
  Filled 2019-10-15 (×4): qty 1
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
## 2019-10-16 LAB — CBC
HCT: 28.3 % — ABNORMAL LOW (ref 39.0–52.0)
Hemoglobin: 8.6 g/dL — ABNORMAL LOW (ref 13.0–17.0)
MCH: 24.2 pg — ABNORMAL LOW (ref 26.0–34.0)
MCHC: 30.4 g/dL (ref 30.0–36.0)
MCV: 79.7 fL — ABNORMAL LOW (ref 80.0–100.0)
Platelets: 596 10*3/uL — ABNORMAL HIGH (ref 150–400)
RBC: 3.55 MIL/uL — ABNORMAL LOW (ref 4.22–5.81)
RDW: 21.6 % — ABNORMAL HIGH (ref 11.5–15.5)
WBC: 12.6 10*3/uL — ABNORMAL HIGH (ref 4.0–10.5)
nRBC: 0 % (ref 0.0–0.2)

## 2019-10-16 LAB — BASIC METABOLIC PANEL
Anion gap: 11 (ref 5–15)
BUN: 12 mg/dL (ref 6–20)
CO2: 23 mmol/L (ref 22–32)
Calcium: 8.8 mg/dL — ABNORMAL LOW (ref 8.9–10.3)
Chloride: 101 mmol/L (ref 98–111)
Creatinine, Ser: 1.31 mg/dL — ABNORMAL HIGH (ref 0.61–1.24)
GFR calc Af Amer: >60 mL/min (ref >60)
GFR calc non Af Amer: >60 mL/min (ref >60)
Glucose, Bld: 110 mg/dL — ABNORMAL HIGH (ref 70–99)
Potassium: 4.7 mmol/L (ref 3.5–5.1)
Sodium: 135 mmol/L (ref 135–145)

## 2019-10-16 MED ORDER — HEPARIN SODIUM (PORCINE) 5000 UNIT/ML IJ SOLN
5000.0000 [IU] | Freq: Three times a day (TID) | INTRAMUSCULAR | Status: AC
  Administered 2019-10-16 – 2019-10-22 (×14): 5000 [IU] via SUBCUTANEOUS
  Filled 2019-10-16 (×18): qty 1

## 2019-10-16 NOTE — Progress Notes (Signed)
Pt called this RN to his room c/o chest pain. Pt described pain as sudden, stabbing in his chest that moved to his mid/lower back. Did precautionary 12 lead; shows NSR. Pt states pain scared him more than anything. Will continue to monitor patient.
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
## 2019-10-16 NOTE — Progress Notes (Signed)
Patient is alert and oriented, vss on room air. Patient has no complaints at this time. Cal bell in reach.

## 2019-10-17 MED ORDER — GABAPENTIN 400 MG PO CAPS
800.0000 mg | ORAL_CAPSULE | Freq: Three times a day (TID) | ORAL | Status: DC
  Administered 2019-10-17 – 2019-10-30 (×40): 800 mg via ORAL
  Filled 2019-10-17 (×39): qty 2
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
## 2019-10-17 NOTE — Progress Notes (Signed)
Pt had unintentionally pulled out remaining iv in LFA. New 20 gauge iv started in right wrist.

## 2019-10-17 NOTE — Progress Notes (Signed)
Patient BP has been in the high 30'Z systolic following bed time meds when sleeping. Patient remains easy to arouse, alert and oriented.

## 2019-10-18 LAB — CULTURE, BLOOD (ROUTINE X 2)
Culture: NO GROWTH
Culture: NO GROWTH
Special Requests: ADEQUATE
Special Requests: ADEQUATE

## 2019-10-18 LAB — BASIC METABOLIC PANEL
Anion gap: 10 (ref 5–15)
BUN: 18 mg/dL (ref 6–20)
CO2: 21 mmol/L — ABNORMAL LOW (ref 22–32)
Calcium: 8.9 mg/dL (ref 8.9–10.3)
Chloride: 101 mmol/L (ref 98–111)
Creatinine, Ser: 1.27 mg/dL — ABNORMAL HIGH (ref 0.61–1.24)
GFR calc Af Amer: >60 mL/min (ref >60)
GFR calc non Af Amer: >60 mL/min (ref >60)
Glucose, Bld: 110 mg/dL — ABNORMAL HIGH (ref 70–99)
Potassium: 4.7 mmol/L (ref 3.5–5.1)
Sodium: 132 mmol/L — ABNORMAL LOW (ref 135–145)

## 2019-10-18 LAB — CBC
HCT: 31 % — ABNORMAL LOW (ref 39.0–52.0)
Hemoglobin: 9.3 g/dL — ABNORMAL LOW (ref 13.0–17.0)
MCH: 23.7 pg — ABNORMAL LOW (ref 26.0–34.0)
MCHC: 30 g/dL (ref 30.0–36.0)
MCV: 79.1 fL — ABNORMAL LOW (ref 80.0–100.0)
Platelets: 725 10*3/uL — ABNORMAL HIGH (ref 150–400)
RBC: 3.92 MIL/uL — ABNORMAL LOW (ref 4.22–5.81)
RDW: 22 % — ABNORMAL HIGH (ref 11.5–15.5)
WBC: 15.7 10*3/uL — ABNORMAL HIGH (ref 4.0–10.5)
nRBC: 0 % (ref 0.0–0.2)
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
## 2019-10-18 NOTE — Progress Notes (Signed)
Pharmacy Antibiotic Note  Nathaniel Hansen is a 33 y.o. male admitted on 10/07/2019 with streptococcus bacteremia/endocarditis.  Pharmacy has been consulted for Penicillin G and Gentmicin dosing. BCx + streptococcus mitis and 2D ECHO is positive for vegetation.  ID2 weeks of penicillin G and gentamicin for synergy.  May need 4-6 weeks of IV antibiotics.   Plan: Continue penicillin G IV 18 million units continuous infusion Gentamicin 3mg /kg/day once daily dosing 220mg  for 2 weeks F/U cxs and clinical progress Monitor V/S, labs and levels as indicated  Height: 5\' 11"  (180.3 cm) Weight: 153 lb 3.5 oz (69.5 kg) IBW/kg (Calculated) : 75.3  Temp (24hrs), Avg:98.9 F (37.2 C), Min:98.4 F (36.9 C), Max:99.3 F (37.4 C)  Recent Labs  Lab 10/12/19 0454 10/13/19 1642 10/14/19 0414 10/16/19 0402 10/18/19 0623  WBC 13.7*  --  8.7 12.6* 15.7*  CREATININE 0.95  --  1.21 1.31* 1.27*  GENTTROUGH  --  <0.5*  --   --   --     Estimated Creatinine Clearance: 82.8 mL/min (A) (by C-G formula based on SCr of 1.27 mg/dL (H)).    No Known Allergies  Antimicrobials this admission: Vancomycin 11/16>>11/8 Ceftriaxone 11/16 >>11/18 Azithromycin 11/16>> 11/18 Cefazolin 11/18>> 11/20 Penicillin G 11/20 >> Gentamicin 11/18>>  Levaquin 11/21 >> 11/27   Dose adjustments this admission: n/a   Microbiology results: 11/22 Bcx: ng 11/18 Bcx: ng 11/16 BCx: streptococcus mitis 11/16 UCx: no growth  MRSA PCR: negative   Thank you for allowing pharmacy to be a part of this patient's care.  Margot Ables, PharmD Clinical Pharmacist 10/18/2019 2:10 PM

## 2019-10-19 DIAGNOSIS — B955 Unspecified streptococcus as the cause of diseases classified elsewhere: Secondary | ICD-10-CM

## 2019-10-19 DIAGNOSIS — F1990 Other psychoactive substance use, unspecified, uncomplicated: Secondary | ICD-10-CM

## 2019-10-19 DIAGNOSIS — I079 Rheumatic tricuspid valve disease, unspecified: Secondary | ICD-10-CM
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
## 2019-10-20 LAB — COMPREHENSIVE METABOLIC PANEL
ALT: 34 U/L (ref 0–44)
AST: 44 U/L — ABNORMAL HIGH (ref 15–41)
Albumin: 2.5 g/dL — ABNORMAL LOW (ref 3.5–5.0)
Alkaline Phosphatase: 122 U/L (ref 38–126)
Anion gap: 10 (ref 5–15)
BUN: 15 mg/dL (ref 6–20)
CO2: 21 mmol/L — ABNORMAL LOW (ref 22–32)
Calcium: 8.9 mg/dL (ref 8.9–10.3)
Chloride: 102 mmol/L (ref 98–111)
Creatinine, Ser: 1.15 mg/dL (ref 0.61–1.24)
GFR calc Af Amer: >60 mL/min (ref >60)
GFR calc non Af Amer: >60 mL/min (ref >60)
Glucose, Bld: 99 mg/dL (ref 70–99)
Potassium: 4.2 mmol/L (ref 3.5–5.1)
Sodium: 133 mmol/L — ABNORMAL LOW (ref 135–145)
Total Bilirubin: 0.3 mg/dL (ref 0.3–1.2)
Total Protein: 8.8 g/dL — ABNORMAL HIGH (ref 6.5–8.1)

## 2019-10-20 LAB — CBC
HCT: 30.3 % — ABNORMAL LOW (ref 39.0–52.0)
Hemoglobin: 8.9 g/dL — ABNORMAL LOW (ref 13.0–17.0)
MCH: 23.9 pg — ABNORMAL LOW (ref 26.0–34.0)
MCHC: 29.4 g/dL — ABNORMAL LOW (ref 30.0–36.0)
MCV: 81.2 fL (ref 80.0–100.0)
Platelets: 718 10*3/uL — ABNORMAL HIGH (ref 150–400)
RBC: 3.73 MIL/uL — ABNORMAL LOW (ref 4.22–5.81)
RDW: 22.1 % — ABNORMAL HIGH (ref 11.5–15.5)
WBC: 13.1 10*3/uL — ABNORMAL HIGH (ref 4.0–10.5)
nRBC: 0 % (ref 0.0–0.2)
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
## 2019-10-21 LAB — CBC WITH DIFFERENTIAL/PLATELET
Abs Immature Granulocytes: 0.05 10*3/uL (ref 0.00–0.07)
Basophils Absolute: 0.1 10*3/uL (ref 0.0–0.1)
Basophils Relative: 1 %
Eosinophils Absolute: 0.1 10*3/uL (ref 0.0–0.5)
Eosinophils Relative: 1 %
HCT: 31.2 % — ABNORMAL LOW (ref 39.0–52.0)
Hemoglobin: 9 g/dL — ABNORMAL LOW (ref 13.0–17.0)
Immature Granulocytes: 1 %
Lymphocytes Relative: 13 %
Lymphs Abs: 1.4 10*3/uL (ref 0.7–4.0)
MCH: 23.4 pg — ABNORMAL LOW (ref 26.0–34.0)
MCHC: 28.8 g/dL — ABNORMAL LOW (ref 30.0–36.0)
MCV: 81.3 fL (ref 80.0–100.0)
Monocytes Absolute: 0.4 10*3/uL (ref 0.1–1.0)
Monocytes Relative: 4 %
Neutro Abs: 8.9 10*3/uL — ABNORMAL HIGH (ref 1.7–7.7)
Neutrophils Relative %: 80 %
Platelets: 765 10*3/uL — ABNORMAL HIGH (ref 150–400)
RBC: 3.84 MIL/uL — ABNORMAL LOW (ref 4.22–5.81)
RDW: 22.5 % — ABNORMAL HIGH (ref 11.5–15.5)
WBC: 10.8 10*3/uL — ABNORMAL HIGH (ref 4.0–10.5)
nRBC: 0 % (ref 0.0–0.2)

## 2019-10-21 LAB — BASIC METABOLIC PANEL
Anion gap: 13 (ref 5–15)
BUN: 14 mg/dL (ref 6–20)
CO2: 22 mmol/L (ref 22–32)
Calcium: 9.3 mg/dL (ref 8.9–10.3)
Chloride: 101 mmol/L (ref 98–111)
Creatinine, Ser: 1.15 mg/dL (ref 0.61–1.24)
GFR calc Af Amer: >60 mL/min (ref >60)
GFR calc non Af Amer: >60 mL/min (ref >60)
Glucose, Bld: 104 mg/dL — ABNORMAL HIGH (ref 70–99)
Potassium: 4.3 mmol/L (ref 3.5–5.1)
Sodium: 136 mmol/L (ref 135–145)

## 2019-10-21 LAB — GENTAMICIN LEVEL, TROUGH: Gentamicin Trough: <0.5 ug/mL — ABNORMAL LOW (ref 0.5–2.0)

## 2019-10-21 NOTE — Progress Notes (Signed)
Initial Nutrition Assessment  DOCUMENTATION CODES:      INTERVENTION:  Regular diet  Snack between meals as desired   NUTRITION DIAGNOSIS:   Increased nutrient needs related to acute illness(septic shock-endocarditis) as evidenced by estimated needs.  GOAL:   Patient will meet greater than or equal to 90% of their needs MONITOR:   PO intake, Labs, Weight trends  REASON FOR ASSESSMENT:   LOS    ASSESSMENT:  Patient is a 33 yo with hx of IV drug abuse. Presents with septic shock-Strep Oralis/Mitis Endocarditis. Anemia (received 3 units PRBC's), CAP,  chronic back pain. He is transferring out of the ICU today.  Meal intake data sporadic. Discussed with patient and nursing. His appetite is excellent- po's mostly 100% of meals. Patient would like to gain weight if possible. Nutrition services is obtaining meal preferences for him to optimize intake.  Weight history limited. It appears from chart review he has been weighing around 165-170 lb (77 kg). No actual updated wt since March of this year. Currently 150 lb- reflects a loss of 19 lb (11%) compared to 8 months ago. Timeline unclear at this point.  Intake/Output Summary (Last 24 hours) at 10/21/2019 1335 Last data filed at 10/21/2019 0600 Gross per 24 hour  Intake 1839.6 ml  Output 2251 ml  Net -411.4 ml      Medications reviewed and include: Gentamycin, Penicillin,  Senokot, Nicoderm.  Labs: BMP Latest Ref Rng & Units 10/21/2019 10/20/2019 10/18/2019  Glucose 70 - 99 mg/dL 104(H) 99 110(H)  BUN 6 - 20 mg/dL 14 15 18   Creatinine 0.61 - 1.24 mg/dL 1.15 1.15 1.27(H)  Sodium 135 - 145 mmol/L 136 133(L) 132(L)  Potassium 3.5 - 5.1 mmol/L 4.3 4.2 4.7  Chloride 98 - 111 mmol/L 101 102 101  CO2 22 - 32 mmol/L 22 21(L) 21(L)  Calcium 8.9 - 10.3 mg/dL 9.3 8.9 8.9     NUTRITION - FOCUSED PHYSICAL EXAM: pending   Diet Order:   Diet Order            Diet regular Room service appropriate? Yes; Fluid consistency: Thin   Diet effective now              EDUCATION NEEDS:   No education needs have been identified at this time Skin:  Skin Assessment: Reviewed RN Assessment  Last BM:  11/29  Height:   Ht Readings from Last 1 Encounters:  10/21/19 5\' 11"  (1.803 m)    Weight:   Current wt. 150.1 lb Wt Readings from Last 1 Encounters:  10/21/19 68.1 kg  Admission wt. 74.8 kg (165 lb)  Ideal Body Weight:  78 kg  BMI:  Body mass index is 20.94 kg/m.  Estimated Nutritional Needs:   Kcal:  2176-2380 (32-35 kcal/kg/bw)  Protein:  88-102 gr (1.3-1.5 gr/kg/bw)  Fluid:  >2 liters daily  Colman Cater MS,RD,CSG,LDN Office: [phone] Pager: [phone]
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
## 2019-10-22 MED ORDER — ENOXAPARIN SODIUM 40 MG/0.4ML ~~LOC~~ SOLN
40.0000 mg | SUBCUTANEOUS | Status: DC
  Administered 2019-10-23 – 2019-10-25 (×3): 40 mg via SUBCUTANEOUS
  Filled 2019-10-22 (×7): qty 0.4

## 2019-10-22 MED ORDER — BUPRENORPHINE HCL-NALOXONE HCL 8-2 MG SL SUBL: 1.0000 | SUBLINGUAL_TABLET | Freq: Two times a day (BID) | SUBLINGUAL | Status: DC

## 2019-10-22 MED ORDER — BUPRENORPHINE HCL-NALOXONE HCL 2-0.5 MG SL SUBL: 1.0000 | SUBLINGUAL_TABLET | SUBLINGUAL | Status: AC | PRN

## 2019-10-22 MED ORDER — BUPRENORPHINE HCL-NALOXONE HCL 8-2 MG SL SUBL
1.0000 | SUBLINGUAL_TABLET | Freq: Two times a day (BID) | SUBLINGUAL | Status: DC
  Administered 2019-10-23 – 2019-10-30 (×15): 1 via SUBLINGUAL
  Filled 2019-10-22 (×16): qty 1
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
## 2019-10-22 NOTE — Progress Notes (Signed)
Pharmacy Antibiotic Note  Nathaniel Hansen is a 33 y.o. male admitted on 10/07/2019 with streptococcus bacteremia/endocarditis.  Pharmacy has been consulted for Penicillin G and Gentmicin dosing. BCx + streptococcus mitis and 2D ECHO is positive for vegetation.  ID2 weeks of penicillin G and gentamicin for synergy.  May need 4-6 weeks of IV antibiotics. Patient still febrile. Gentamicin trough is <25mcg/ml. Scr is stable. To complete Gentamicin 12/1  Plan: Continue penicillin G IV 18 million units continuous infusion Gentamicin 3mg /kg/day once daily dosing 220mg  for 2 weeks(last day 12/2) F/U cxs and clinical progress Monitor V/S, labs and levels as indicated  Height: 5\' 11"  (180.3 cm) Weight: 154 lb 12.2 oz (70.2 kg) IBW/kg (Calculated) : 75.3  Temp (24hrs), Avg:98.9 F (37.2 C), Min:98.4 F (36.9 C), Max:99.4 F (37.4 C)  Recent Labs  Lab 10/16/19 0402 10/18/19 0623 10/20/19 0454 10/21/19 0503 10/21/19 1455  WBC 12.6* 15.7* 13.1* 10.8*  --   CREATININE 1.31* 1.27* 1.15 1.15  --   GENTTROUGH  --   --   --   --  <0.5*    Estimated Creatinine Clearance: 92.4 mL/min (by C-G formula based on SCr of 1.15 mg/dL).    No Known Allergies  Antimicrobials this admission: Vancomycin 11/16>>11/8 Ceftriaxone 11/16 >>11/18 Azithromycin 11/16>> 11/18 Cefazolin 11/18>> 11/20 Penicillin G 11/20 >> Gentamicin 11/18>>  Levaquin 11/21 >> 11/27   Dose adjustments this admission: n/a   Microbiology results: 11/22 Bcx: ngtd 11/18 Bcx: ngtd 11/16 BCx: streptococcus mitis 11/16 UCx: no growth  MRSA PCR: negative   Thank you for allowing pharmacy to be a part of this patient's care.  Isac Sarna, BS Pharm D, [place] Clinical Pharmacist Pager [phone] 10/22/2019 9:19 AM

## 2019-10-22 NOTE — Plan of Care (Signed)
  Problem: Education: Goal: Knowledge of General Education information will improve Description: Including pain rating scale, medication(s)/side effects and non-pharmacologic comfort measures Outcome: Progressing   Problem: Health Behavior/Discharge Planning: Goal: Ability to manage health-related needs will improve Outcome: Progressing   Problem: Clinical Measurements: Goal: Ability to maintain clinical measurements within normal limits will improve Outcome: Progressing Goal: Will remain free from infection Outcome: Progressing Goal: Diagnostic test results will improve Outcome: Progressing Goal: Cardiovascular complication will be avoided Outcome: Progressing   Problem: Activity: Goal: Risk for activity intolerance will decrease Outcome: Progressing   Problem: Nutrition: Goal: Adequate nutrition will be maintained Outcome: Progressing   Problem: Coping: Goal: Level of anxiety will decrease Outcome: Progressing   Problem: Elimination: Goal: Will not experience complications related to bowel motility Outcome: Progressing Goal: Will not experience complications related to urinary retention Outcome: Progressing   Problem: Pain Managment: Goal: General experience of comfort will improve Outcome: Progressing   Problem: Safety: Goal: Ability to remain free from injury will improve Outcome: Progressing   Problem: Skin Integrity: Goal: Risk for impaired skin integrity will decrease Outcome: Progressing   Problem: Fluid Volume: Goal: Hemodynamic stability will improve Outcome: Progressing   Problem: Clinical Measurements: Goal: Diagnostic test results will improve Outcome: Progressing Goal: Signs and symptoms of infection will decrease Outcome: Progressing   Problem: Respiratory: Goal: Ability to maintain adequate ventilation will improve Outcome: Progressing   Problem: Fluid Volume: Goal: Hemodynamic stability will improve Outcome: Progressing   Problem:  Clinical Measurements: Goal: Diagnostic test results will improve Outcome: Progressing Goal: Signs and symptoms of infection will decrease Outcome: Progressing   Problem: Respiratory: Goal: Ability to maintain adequate ventilation will improve Outcome: Progressing

## 2019-10-23 ENCOUNTER — Encounter (HOSPITAL_COMMUNITY): Payer: Self-pay | Admitting: Family Medicine

## 2019-10-23 DIAGNOSIS — D72829 Elevated white blood cell count, unspecified: Secondary | ICD-10-CM | POA: Diagnosis present

## 2019-10-23 DIAGNOSIS — R509 Fever, unspecified: Secondary | ICD-10-CM | POA: Diagnosis present

## 2019-10-23 DIAGNOSIS — Z5181 Encounter for therapeutic drug level monitoring: Secondary | ICD-10-CM

## 2019-10-23 DIAGNOSIS — F1120 Opioid dependence, uncomplicated: Secondary | ICD-10-CM | POA: Diagnosis present

## 2019-10-23 LAB — CBC WITH DIFFERENTIAL/PLATELET
Abs Immature Granulocytes: 0.05 10*3/uL (ref 0.00–0.07)
Basophils Absolute: 0.1 10*3/uL (ref 0.0–0.1)
Basophils Relative: 1 %
Eosinophils Absolute: 0.1 10*3/uL (ref 0.0–0.5)
Eosinophils Relative: 1 %
HCT: 28.8 % — ABNORMAL LOW (ref 39.0–52.0)
Hemoglobin: 8.6 g/dL — ABNORMAL LOW (ref 13.0–17.0)
Immature Granulocytes: 1 %
Lymphocytes Relative: 15 %
Lymphs Abs: 1.4 10*3/uL (ref 0.7–4.0)
MCH: 23.6 pg — ABNORMAL LOW (ref 26.0–34.0)
MCHC: 29.9 g/dL — ABNORMAL LOW (ref 30.0–36.0)
MCV: 79.1 fL — ABNORMAL LOW (ref 80.0–100.0)
Monocytes Absolute: 0.7 10*3/uL (ref 0.1–1.0)
Monocytes Relative: 7 %
Neutro Abs: 7.4 10*3/uL (ref 1.7–7.7)
Neutrophils Relative %: 75 %
Platelets: 738 10*3/uL — ABNORMAL HIGH (ref 150–400)
RBC: 3.64 MIL/uL — ABNORMAL LOW (ref 4.22–5.81)
RDW: 22 % — ABNORMAL HIGH (ref 11.5–15.5)
WBC: 9.7 10*3/uL (ref 4.0–10.5)
nRBC: 0 % (ref 0.0–0.2)

## 2019-10-23 NOTE — TOC Progression Note (Signed)
Transition of Care [facility]) - Progression Note    Patient Details  Name: Nathaniel Hansen MRN: [id number] Date of Birth: 1986-05-20  Transition of Care Operating Room Services) CM/SW Contact  Shade Flood, LCSW Phone Number: 10/23/2019, 11:04 AM  Clinical Narrative:     TOC following. Pt to have Echo tomorrow to reassess. Per MD, if pt showing signs of improvement, he will remain in the hospital for two more weeks of IV antibiotics. If no improvement, then pt will likely require 4 more weeks instead of 2. TOC will continue to follow and work with pt on AODA treatment referrals once dc timeframe established.  Expected Discharge Plan: Home/Self Care Barriers to Discharge: Continued Medical Work up  Expected Discharge Plan and Services Expected Discharge Plan: Home/Self Care                                               Social Determinants of Health (SDOH) Interventions    Readmission Risk Interventions No flowsheet data found.

## 2019-10-23 NOTE — Progress Notes (Signed)
Patient requested to take shower. Notified Dr. Wynetta Emery. Stated okay for patient to be off of penicillin G infusion to take shower. Donavan Foil, RN

## 2019-10-23 NOTE — Progress Notes (Signed)
PROGRESS NOTE   Shella SpearingStedman L Mcgregory  ZOX:[id number] DOB: 09/07/1986 DOA: 10/07/2019 PCP: Patient, No Pcp Per    Brief Narrative:  33 year old male with history of  IVDU admitted to the hospital with septic shock due to Strep Oralis Bacteremia as well as severe anemia.  Hemoglobin noted to be low at 5.7.  He was transfused 2 units of PRBC.  He initially required IV fluids and vasopressors.  weaned off of Levophed.   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    - MRI of C-spine and L-spine on 10/09/19 without acute findings, specifically no discitis or epidural infection -Initially received Rocephin and vancomycin (both stopped) -Repeat blood cultures 10/09/2019 and 10/13/2019  NGTD  --Off vasopressors  - cortisol normal,  HIV antibody nonreactive -Tricuspid vegetation measures 1.2 x 1.2 cm with moderate  to severe tricuspid regurgitation -Discussed with cardiologist Dr. Wyline MoodBranch who read the TTE, at this time no need for CT surgery consult, and no need for TEE - Discussed with Dr. Felipe DroneNick Powers from ID on 10/09/2019-recommends IV Ancef or PCN G along with synergistic IV gentamycin) therapy for 2 weeks then repeat TTE, if tricuspid valve vegetation is less than 1 cm at that time may consider Ancef or PCN G monotherapy for additional 2 weeks for total of 4 weeks of therapy as long as blood cultures are negative and sepsis pathophysiology including fever and leukocytosis resolved.   -Please see consult note from Dr. Lorenso CourierPowers dated 10/09/2019 -Vancomycin 11/16>>11/8 Ceftriaxone 11/16>>11/18 Azithromycin 11/16>>11/18 Cefazolin 11/18>> 11/20 Penicillin G 11/20 >> Gentamicin 11/18>> for 2 weeks for synergy -Plan is for repeat TTE around 10/24/2019 (after at least 2 weeks of IV antibiotics)  2)Bilateral Community-acquired pneumonia - TREATED -Clinically, patient appears to be improving.  Fevers improving, he is not short of breath or coughing. -repeat chest x-ray on 10/12/2019 with bilateral pneumonia left more than right -WBC count is coming down --Added Levaquin for CAP on 11/21> 11/28  3) acute on chronic symptomatic anemia  ---Anemia panel indicates some degree of chronic disease.  May have a component of anemia of critical illness.  Hemoglobin is up to 9.3 from 6.8, patient has received transfusion of PRBC this admission (total 3 units PRBC)   He does not have any evidence of bleeding.  No significant evidence of hemolysis.  -Stool occult blood is negative -History of low MCV and low MCH with elevated RDW -Transfuse as needed  4)Chronic back pain / opioid dependence -  Pt strongly motivated not to return to injecting heroin.  He had reportedly been compliant with a suboxone treatment program for the month prior to admission and had not injected heroin.  He would like to be maintained on suboxone which  is very reasonable.  He has been restarted on suboxone with hopes of preventing relapse.    -  MRI of the C-spine as well as lumbar spine without discitis or epidural abscess -Continue gabapentin, methocarbamol and as needed Tylenol  5) Leukocytosis - WBC has trended down and normalized with treatments.  6) Fever -his high fevers have defervesced with treatments.  DVT prophylaxis: Lovenox Code Status: Full code Family Communication: Patient is alert and coherent  Disposition Plan: If TTE 10/24/19 shows improved vegetations, would need 2 additional weeks of inpatient IV antibiotic treatment for Strep tricuspid endocarditis to complete a full 4 week course.  -Please see ID consult on recommendations  Consultants:   Phone consult with Dr. Wyline Mood from cardiology service who read echo 10/09/2019  -Dr. Felipe Drone from infectious disease 10/09/2019  Procedures:   Right IJ central line placement 11/16 > removed -10/09/2019 Echo-TTE--Tricuspid vegetation measures 1.2 x 1.2 cm with mild to moderate tricuspid regurgitation  10/24/19 2D Echo (pending)  Antimicrobials:  -Vancomycin 11/16>>11/8 Ceftriaxone 11/16>>11/18 Azithromycin 11/16>>11/18 Cefazolin 11/18>> 11/20 Penicillin G 11/20 >> Gentamicin 11/18>> for 2 weeks for synergy Added Levaquin for CAP on 10/12/2019> 11/28  Subjective:  Pt reports that he is feeling much better.  He saying he is having less chest pain and shortness of breath.  He is interested in getting back on Suboxone because he does not want to go back to using heroin.  He is highly motivated to stay clean and does not want to have another repeat of this type of infection.  He reports that he had been compliant for the past month in the Suboxone treatment program that he had been on prior to this admission.  Objective: Vitals:   10/22/19 0522 10/22/19 1951 10/23/19 0448 10/23/19 0451  BP: 117/62   125/65  Pulse: 78   86  Resp: 16   16  Temp: 99.4 F (37.4 C)    98.5 F (36.9 C)  TempSrc: Oral   Oral  SpO2: 99% 96%  98%  Weight: 70.2 kg  70.9 kg   Height:       Temp:  [98.5 F (36.9 C)] 98.5 F (36.9 C) (12/02 0451) Pulse Rate:  [86] 86 (12/02 0451) Resp:  [16] 16 (12/02 0451) BP: (125)/(65) 125/65 (12/02 0451) SpO2:  [96 %-98 %] 98 % (12/02 0451) Weight:  [70.9 kg] 70.9 kg (12/02 0448)   Intake/Output Summary (Last 24 hours) at 10/23/2019 1118 Last data filed at 10/22/2019 1930 Gross per 24 hour  Intake 480 ml  Output 1000 ml  Net -520 ml   Filed Weights   10/21/19 0451 10/22/19 0522 10/23/19 0448  Weight: 68.1 kg 70.2 kg 70.9 kg    Examination:  General exam: NCAT, awake, alert, NAD, cooperative.  Respiratory system: BBS CTA Cardiovascular system: normal s1,s2 sounds.  No murmur heard. Gastrointestinal system: Abdomen is nondistended, soft and nontender. No HSM. Central nervous system: Alert and oriented. No focal neurological deficits. Extremities: No C/C/E, +pedal pulses Skin: No rashes, lesions or ulcers Psychiatry: Judgement and insight appear normal. Mood & affect appropriate.   Data Reviewed:  CBC: Recent Labs  Lab 10/18/19 0623 10/20/19 0454 10/21/19 0503 10/23/19 0510  WBC 15.7* 13.1* 10.8* 9.7  NEUTROABS  --   --  8.9* 7.4  HGB 9.3* 8.9* 9.0* 8.6*  HCT 31.0* 30.3* 31.2* 28.8*  MCV 79.1* 81.2 81.3 79.1*  PLT 725* 718* 765* 347*   Basic Metabolic Panel: Recent Labs  Lab 10/18/19 0623 10/20/19 0454 10/21/19 0503  NA 132* 133* 136  K 4.7 4.2 4.3  CL 101 102 101  CO2 21* 21* 22  GLUCOSE 110* 99 104*  BUN 18 15 14   CREATININE 1.27* 1.15 1.15  CALCIUM 8.9 8.9 9.3   GFR: Estimated Creatinine Clearance: 93.3 mL/min (by C-G formula based on SCr of 1.15 mg/dL). Liver Function Tests: Recent Labs  Lab 10/20/19 0454  AST 44*  ALT 34  ALKPHOS 122  BILITOT 0.3  PROT 8.8*  ALBUMIN 2.5*   No results for input(s): LIPASE, AMYLASE in the last 168 hours. No results for input(s): AMMONIA in the last 168  hours. Coagulation Profile: No results for input(s): INR, PROTIME in the last 168 hours. Cardiac Enzymes: No results for input(s): CKTOTAL, CKMB, CKMBINDEX, TROPONINI in the last 168 hours. BNP (last 3 results) No results for input(s): PROBNP in the last 8760 hours. HbA1C: No results for input(s): HGBA1C in the last 72 hours. CBG: No results for input(s): GLUCAP in the last 168 hours. Lipid Profile: No results for input(s): CHOL, HDL, LDLCALC, TRIG, CHOLHDL, LDLDIRECT in the last 72 hours. Thyroid Function Tests: No results for input(s): TSH, T4TOTAL, FREET4, T3FREE, THYROIDAB in the last 72 hours. Anemia Panel: No results for input(s): VITAMINB12, FOLATE, FERRITIN, TIBC, IRON, RETICCTPCT in the last 72 hours. Sepsis Labs: No results for input(s): PROCALCITON, LATICACIDVEN in the last 168 hours.  Recent Results (from the past 240 hour(s))  Culture, blood (Routine X 2) w Reflex to ID Panel     Status: None   Collection Time: 10/13/19  2:35 PM   Specimen: Left Antecubital; Blood  Result Value Ref Range Status   Specimen Description LEFT ANTECUBITAL  Final   Special Requests   Final    BOTTLES DRAWN AEROBIC AND ANAEROBIC Blood Culture adequate volume   Culture   Final    NO GROWTH 5 DAYS Performed at [facility], [address] [place], [place] [address]    Report Status 10/18/2019 FINAL  Final  Culture, blood (Routine X 2) w Reflex to ID Panel     Status: None   Collection Time: 10/13/19  4:42 PM   Specimen: BLOOD RIGHT HAND  Result Value Ref Range Status   Specimen Description BLOOD RIGHT HAND  Final   Special Requests   Final    BOTTLES DRAWN AEROBIC AND ANAEROBIC Blood Culture adequate volume   Culture   Final    NO GROWTH 5 DAYS Performed at [facility], [address], [place], [place] [address]    Report Status 10/18/2019 FINAL  Final     Radiology Studies: No results found. Scheduled Meds: . buprenorphine-naloxone  1 tablet Sublingual BID  . Chlorhexidine  Gluconate Cloth  6 each Topical Daily  . enoxaparin (LOVENOX) injection  40 mg Subcutaneous Q24H  . gabapentin  800 mg Oral TID  . hydrOXYzine  50 mg Oral TID  . methocarbamol  1,000 mg Oral TID  . nicotine  21 mg Transdermal Daily  . senna-docusate  2 tablet Oral BID  . traZODone  150 mg Oral QHS   Continuous Infusions: . sodium chloride 50 mL/hr at 10/23/19 0443  . penicillin g continuous IV infusion 9 Million Units (10/23/19 0040)    LOS: 16 days  Standley Dakins, MD [facility] How to contact the [facility] Attending or Consulting provider 7A - 7P or covering provider during after hours 7P -7A, for this patient?  1. Check the care team in [facility] and look for a) attending/consulting [facility] provider listed and b) the [facility] team listed 2. Log into [email or web address] and use [place] universal password to access. If you do not have the password, please contact the hospital operator. 3. Locate the [facility] provider you are looking for under [facility] and page to a number that you can be directly reached. 4. If you still have difficulty reaching the provider, please page the [facility] (Director on Call) for the Hospitalists listed on amion for assistance.  If 7PM-7AM, please contact night-coverage [email or web address]  10/23/2019, 11:18 AM

## 2019-10-23 NOTE — Progress Notes (Signed)
Present with Mr Wolf as I began to listen/assess. Initial visit on faith and current life ideals and goals. Plan to continue to follow up with him during his hospitalization.

## 2019-10-24 ENCOUNTER — Other Ambulatory Visit (HOSPITAL_COMMUNITY): Payer: Self-pay | Admitting: Internal Medicine

## 2019-10-24 ENCOUNTER — Inpatient Hospital Stay (HOSPITAL_COMMUNITY): Payer: Self-pay

## 2019-10-24 DIAGNOSIS — I361 Nonrheumatic tricuspid (valve) insufficiency: Secondary | ICD-10-CM

## 2019-10-24 LAB — ECHOCARDIOGRAM LIMITED
Height: 71 in
Weight: 2441.6 oz

## 2019-10-24 NOTE — Progress Notes (Signed)
PROGRESS NOTE   WARNIE BELAIR  KGM:[id number] DOB: 11-15-1986 DOA: 10/07/2019 PCP: Patient, No Pcp Per    Brief Narrative:  33 year old male with history of  IVDU admitted to the hospital with septic shock due to Strep Oralis Bacteremia as well as severe anemia.  Hemoglobin noted to be low at 5.7.  He was transfused 2 units of PRBC.  He initially required IV fluids and vasopressors.  weaned off of Levophed.   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    - MRI of C-spine and L-spine on 10/09/19 without acute findings, specifically no discitis or epidural infection -Initially received Rocephin and vancomycin (both stopped) -Repeat blood cultures 10/09/2019 and 10/13/2019  NGTD  --Off vasopressors  - cortisol normal,  HIV antibody nonreactive -Tricuspid vegetation measures 1.2 x 1.2 cm with moderate  to severe tricuspid regurgitation -Discussed with cardiologist Dr. Harl Bowie who read the TTE, at this time no need for CT surgery consult, and no need for TEE - Discussed with Dr. Catalina Antigua from ID on 10/09/2019-recommends IV Ancef or PCN G along with synergistic IV gentamycin) therapy for 2 weeks then repeat TTE, if tricuspid valve vegetation is less than 1 cm at that time may consider Ancef or PCN G monotherapy for additional 2 weeks for total of 4 weeks of therapy as long as blood cultures are negative and sepsis pathophysiology including fever and leukocytosis resolved.   -Please see consult note from Dr. Prince Rome dated 10/09/2019 -Vancomycin 11/16>>11/8 Ceftriaxone 11/16>>11/18 Azithromycin 11/16>>11/18 Cefazolin 11/18>> 11/20 Penicillin G 11/20 >> Gentamicin 11/18>> for 2 weeks for synergy -Plan is for repeat TTE 10/24/2019 (after at least 2 weeks of IV antibiotics)  2)Bilateral Community-acquired pneumonia - TREATED -Clinically, patient appears to be improving.  Fevers improving, he is not short of breath or coughing. -repeat chest x-ray on 10/12/2019 with bilateral pneumonia left more than right -WBC count is coming down --Added Levaquin for CAP on 11/21> 11/28  3) acute on chronic symptomatic anemia  ---Anemia panel indicates some degree of chronic disease.  May have a component of anemia of critical illness.  Hemoglobin is up to 9.3 from 6.8, patient has received transfusion of PRBC this admission (total 3 units PRBC)   He does not have any evidence of bleeding.  No significant evidence of hemolysis.  -Stool occult blood is negative -History of low MCV and low MCH with elevated RDW -Transfuse as needed  4)Chronic back pain / opioid dependence -  Pt strongly motivated not to return to injecting heroin.  He had reportedly been compliant with a suboxone treatment program for the month prior to admission and had not injected heroin.  He would like to be maintained on suboxone which is  very reasonable.  He has been restarted on suboxone with hopes of preventing relapse.    -  MRI of the C-spine as well as lumbar spine without discitis or epidural abscess -Continue gabapentin, methocarbamol and as needed Tylenol  5) Leukocytosis - WBC has trended down and normalized with treatments.  6) Fever -his high fevers have defervesced with treatments and we continue to monitor daily.   DVT prophylaxis: Lovenox Code Status: Full code Family Communication: Patient is alert and coherent  Disposition Plan: If TTE 10/24/19 shows improved vegetations, would need 2 additional weeks of inpatient IV antibiotic treatment for Strep tricuspid endocarditis to complete a full 4 week course.  -Please see ID consult on recommendations  Consultants:   Phone consult with Dr. Wyline Mood from cardiology service who read echo 10/09/2019  -Dr. Felipe Drone from infectious disease 10/09/2019  Procedures:   Right IJ central line placement 11/16 > removed -10/09/2019 Echo-TTE--Tricuspid vegetation measures 1.2 x 1.2 cm with mild to moderate tricuspid regurgitation  10/24/19 2D Echo (pending)  Antimicrobials:  -Vancomycin 11/16>>11/8 Ceftriaxone 11/16>>11/18 Azithromycin 11/16>>11/18 Cefazolin 11/18>> 11/20 Penicillin G 11/20 >> Gentamicin 11/18>> for 2 weeks for synergy Added Levaquin for CAP on 10/12/2019> 11/28  Subjective:  Pt reports feels better, no difficulty breathing, he has been ambulating and was able to shower yesterday.  He is agreeable to plan of care.  His opioid cravings are less intense.   Objective: Vitals:   10/23/19 2035 10/23/19 2141 10/24/19 0500 10/24/19 0529  BP:  126/81  111/68  Pulse:  96  84  Resp:  15  17  Temp:  99.6 F (37.6 C)  98.8 F (37.1 C)  TempSrc:  Oral  Oral  SpO2: 98% 99%  99%  Weight:   69.2 kg   Height:       Temp:  [98.8 F (37.1 C)-99.6 F (37.6 C)] 98.8 F (37.1 C) (12/03 0529) Pulse Rate:  [84-96] 84 (12/03 0529) Resp:  [15-17] 17  (12/03 0529) BP: (111-126)/(68-81) 111/68 (12/03 0529) SpO2:  [98 %-99 %] 99 % (12/03 0529) Weight:  [69.2 kg] 69.2 kg (12/03 0500)   Intake/Output Summary (Last 24 hours) at 10/24/2019 1352 Last data filed at 10/24/2019 0800 Gross per 24 hour  Intake 2452.94 ml  Output 200 ml  Net 2252.94 ml   Filed Weights   10/22/19 0522 10/23/19 0448 10/24/19 0500  Weight: 70.2 kg 70.9 kg 69.2 kg    Examination:  General exam: NCAT, awake, alert, NAD, cooperative.  Respiratory system: BBS CTA Cardiovascular system: normal s1,s2 sounds.  No murmur heard. Gastrointestinal system: Abdomen is nondistended, soft and nontender. No HSM. Central nervous system: Alert and oriented. No focal neurological deficits. Extremities: No C/C/E, +pedal pulses Skin: No rashes, lesions or ulcers Psychiatry: Judgement and insight appear normal. Mood & affect appropriate.   Data Reviewed:  CBC: Recent Labs  Lab 10/18/19 0623 10/20/19 0454 10/21/19 0503 10/23/19 0510  WBC 15.7* 13.1* 10.8* 9.7  NEUTROABS  --   --  8.9* 7.4  HGB 9.3* 8.9* 9.0* 8.6*  HCT 31.0* 30.3* 31.2* 28.8*  MCV 79.1* 81.2 81.3 79.1*  PLT 725* 718* 765* 738*   Basic Metabolic Panel: Recent Labs  Lab 10/18/19 0623 10/20/19 0454 10/21/19 0503  NA  132* 133* 136  K 4.7 4.2 4.3  CL 101 102 101  CO2 21* 21* 22  GLUCOSE 110* 99 104*  BUN 18 15 14   CREATININE 1.27* 1.15 1.15  CALCIUM 8.9 8.9 9.3   GFR: Estimated Creatinine Clearance: 91.1 mL/min (by C-G formula based on SCr of 1.15 mg/dL). Liver Function Tests: Recent Labs  Lab 10/20/19 0454  AST 44*  ALT 34  ALKPHOS 122  BILITOT 0.3  PROT 8.8*  ALBUMIN 2.5*   No results for input(s): LIPASE, AMYLASE in the last 168 hours. No results for input(s): AMMONIA in the last 168 hours. Coagulation Profile: No results for input(s): INR, PROTIME in the last 168 hours. Cardiac Enzymes: No results for input(s): CKTOTAL, CKMB, CKMBINDEX, TROPONINI in the last 168 hours. BNP (last  3 results) No results for input(s): PROBNP in the last 8760 hours. HbA1C: No results for input(s): HGBA1C in the last 72 hours. CBG: No results for input(s): GLUCAP in the last 168 hours. Lipid Profile: No results for input(s): CHOL, HDL, LDLCALC, TRIG, CHOLHDL, LDLDIRECT in the last 72 hours. Thyroid Function Tests: No results for input(s): TSH, T4TOTAL, FREET4, T3FREE, THYROIDAB in the last 72 hours. Anemia Panel: No results for input(s): VITAMINB12, FOLATE, FERRITIN, TIBC, IRON, RETICCTPCT in the last 72 hours. Sepsis Labs: No results for input(s): PROCALCITON, LATICACIDVEN in the last 168 hours.  No results found for this or any previous visit (from the past 240 hour(s)).   Radiology Studies: No results found. Scheduled Meds: . buprenorphine-naloxone  1 tablet Sublingual BID  . Chlorhexidine Gluconate Cloth  6 each Topical Daily  . enoxaparin (LOVENOX) injection  40 mg Subcutaneous Q24H  . gabapentin  800 mg Oral TID  . hydrOXYzine  50 mg Oral TID  . methocarbamol  1,000 mg Oral TID  . nicotine  21 mg Transdermal Daily  . senna-docusate  2 tablet Oral BID  . traZODone  150 mg Oral QHS   Continuous Infusions: . sodium chloride 50 mL/hr at 10/24/19 0230  . penicillin g continuous IV infusion 9 Million Units (10/23/19 2339)    LOS: 17 days    Standley Dakinslanford , MD [facility] How to contact the [facility] Attending or Consulting provider 7A - 7P or covering provider during after hours 7P -7A, for this patient?  1. Check the care team in [facility] and look for a) attending/consulting [facility] provider listed and b) the [facility] team listed 2. Log into [email or web address] and use [place]'s universal password to access. If you do not have the password, please contact the hospital operator. 3. Locate the [facility] provider you are looking for under [facility] and page to a number that you can be directly reached. 4. If you still have difficulty reaching the provider, please page the [facility]  (Director on Call) for the Hospitalists listed on amion for assistance.  If 7PM-7AM, please contact night-coverage [email or web address]  10/24/2019, 1:52 PM

## 2019-10-24 NOTE — TOC Progression Note (Signed)
Transition of Care [facility]) - Progression Note    Patient Details  Name: Nathaniel Hansen MRN: [id number] Date of Birth: [birth year]  [facility]) CM/SW Contact  Ihor Gully, LCSW Phone Number: 10/24/2019, 2:25 PM  Clinical Narrative:    Patient states that he is already active in [place] treatment at [place]. PTA patient was sober for over a month. Patient is on suboxone. Patient verbalizes being committed to recovery and tired of living the lifestyle of SA.  Patient will need MATCH voucher at discharge for any medications. Patient will need appointment with [facility] prior to discharge.  Patient has spoken with financial counselor and says Medicaid application was started.    Expected Discharge Plan: Home/Self Care Barriers to Discharge: Active Substance Use with PICC Line, Inadequate or no insurance  Expected Discharge Plan and Services Expected Discharge Plan: Home/Self Care                                               Social Determinants of Health (SDOH) Interventions    Readmission Risk Interventions No flowsheet data found.

## 2019-10-24 NOTE — Progress Notes (Signed)
  Echocardiogram 2D Echocardiogram has been performed.  Matilde Bash 10/24/2019, 11:37 AM

## 2019-10-25 DIAGNOSIS — Z5181 Encounter for therapeutic drug level monitoring: Secondary | ICD-10-CM

## 2019-10-25 DIAGNOSIS — F1129 Opioid dependence with unspecified opioid-induced disorder: Secondary | ICD-10-CM

## 2019-10-25 DIAGNOSIS — Z79899 Other long term (current) drug therapy: Secondary | ICD-10-CM

## 2019-10-25 NOTE — Progress Notes (Signed)
Nathaniel Hansen, Nathaniel Hansen, Nathaniel Hansen    Brief Narrative:  33 year old male with history of  IVDU admitted to the hospital with septic shock due to Strep Oralis Bacteremia as well as severe anemia.  Hemoglobin noted to be low at 5.7.  He was transfused 2 units of PRBC.  He initially required IV fluids and vasopressors.  weaned off of Levophed.   - Blood cx from 10/07/2019- STREPTOCOCCUS MITIS/ORALIS  -TTE- on 10/09/19 with tricuspid valve vegetation -Repeat blood cultures from 10/09/2019 and 10/13/19 NGTD --Plan is for 2 more weeks of IV antibiotics at this point.   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    - MRI of C-spine and L-spine on 10/09/19 without acute findings, specifically Nathaniel discitis or epidural infection -Initially received Rocephin and vancomycin (both stopped) -Repeat blood cultures 10/09/2019 and 10/13/2019  NGTD  --Off vasopressors  - cortisol normal,  HIV antibody nonreactive -Tricuspid vegetation measures 1.2 x 1.2 cm with moderate to severe tricuspid  regurgitation -Discussed with cardiologist Dr. Harl Bowie who read the TTE, at this time Nathaniel need for CT surgery consult, and Nathaniel need for TEE - Discussed with Dr. Catalina Antigua from ID on 10/09/2019-recommends IV Ancef or PCN G along with synergistic IV gentamycin) therapy for 2 weeks then repeat TTE, if tricuspid valve vegetation is less than 1 cm at that time may consider Ancef or PCN G monotherapy for additional 2 weeks for total of 4 weeks of therapy as long as blood cultures are negative and sepsis pathophysiology including fever and leukocytosis resolved.   -Please see consult note from Dr. Prince Rome dated 10/09/2019 -Vancomycin 11/16>>11/8 Ceftriaxone 11/16>>11/18 Azithromycin 11/16>>11/18 Cefazolin 11/18>> 11/20 Penicillin G 11/20 >> Gentamicin 11/18>> for 2 weeks for synergy -Plan is for 2 more weeks of IV antibiotics at this point. -repeat echo demonstrating improved/resolved vegetation.  2)Bilateral Community-acquired pneumonia - TREATED and resolved. -Clinically, Hansen appears to be improved.  Fevers are gone, he is not short of breath or coughing. Normal O2 sat on RA. -repeat chest x-ray on 10/12/2019 with bilateral pneumonia left more than right -WBC count is WNL --Hansen completed Levaquin for CAP on 11/21> 11/28. -repeat CXR in 6 weeks to assure infiltrates resolution.  3) acute on chronic symptomatic anemia  ---Anemia panel indicates some degree of chronic disease.  May have a component of anemia of critical illness.  Hemoglobin is up to 9.3 from 6.8, Hansen has received transfusion of PRBC this admission (total 3 units PRBC). -He does not have any evidence of overt bleeding.  Nathaniel significant evidence of hemolysis.  -Stool occult blood is negative -History of low MCV and low  MCH with elevated RDW -Continue to follow hemoglobin trend and transfuse as needed.  4)Chronic back pain / opioid dependence -  Pt strongly motivated not to return to injecting heroin.  He had reportedly  been compliant with a suboxone treatment program for the month prior to admission and had not injected heroin or use any other recreational drugs during this time.Marland Kitchen.  He would like to be maintained on suboxone which is very reasonable.  He has been restarted on suboxone with hopes of preventing relapse.   -MRI of the C-spine as well as lumbar spine without discitis or epidural abscess -Continue gabapentin, methocarbamol and as needed Tylenol, along with current dose of Suboxone.  Resume outpatient pain clinic management at discharge.  5) Leukocytosis - WBC has trended down and normalized with current treatment.  6) Fever -associated with endocarditis process; at this moment resolved and Hansen has remained afebrile.  Continue current IV antibiotics.  DVT prophylaxis: Lovenox Code Status: Full code Family Communication: Hansen is alert and coherent  Disposition Plan: If TTE on 10/24/2019 demonstrated significant improvement/complete resolution of tricuspid vegetations.  Continue 2 more weeks of IV antibiotics as Hansen ID recommendations.  Continue supportive care.  -Please see ID consult on recommendations  Consultants:   Phone consult with Dr. Wyline MoodBranch from cardiology service who read echo 10/09/2019  -Dr. Felipe DroneNick Powers from infectious disease 10/09/2019  Procedures:   Right IJ central line placement 11/16 > removed -10/09/2019 Echo-TTE--Tricuspid vegetation measures 1.2 x 1.2 cm with mild to moderate tricuspid regurgitation  10/24/19 2D Echo (pending)  Antimicrobials:  -Vancomycin 11/16>>11/8 Ceftriaxone 11/16>>11/18 Azithromycin 11/16>>11/18 Cefazolin 11/18>> 11/20 Penicillin G 11/20 >> Gentamicin 11/18>> for 2 weeks for synergy Added Levaquin for CAP on 10/12/2019> 11/28  Subjective: Hansen is currently afebrile, Nathaniel nausea, Nathaniel vomiting, Nathaniel chest pain.  Denies shortness of breath and demonstrate good oxygen saturation on room air.  He is highly motivated to stay clean and does not  want to have another repeat of this type of infection.   Objective: Vitals:   10/24/19 2057 10/25/19 0500 10/25/19 0526 10/25/19 1457  BP: 120/75  (!) 102/57 (!) 96/57  Pulse: (!) 113  88 94  Resp: 20  17 18   Temp: 99.2 F (37.3 C)  98.6 F (37 C) 98.2 F (36.8 C)  TempSrc: Oral     SpO2: 99%  96% 98%  Weight:  70.1 kg    Height:       Temp:  [98.2 F (36.8 C)-99.2 F (37.3 C)] 98.2 F (36.8 C) (12/04 1457) Pulse Rate:  [88-113] 94 (12/04 1457) Resp:  [17-20] 18 (12/04 1457) BP: (96-120)/(57-75) 96/57 (12/04 1457) SpO2:  [96 %-99 %] 98 % (12/04 1457) Weight:  [70.1 kg] 70.1 kg (12/04 0500)   Intake/Output Summary (Last 24 hours) at 10/25/2019 1543 Last data filed at 10/25/2019 1300 Gross Hansen 24 hour  Intake 4662.84 ml  Output -  Net 4662.84 ml   Filed Weights   10/23/19 0448 10/24/19 0500 10/25/19 0500  Weight: 70.9 kg 69.2 kg 70.1 kg    Examination: General exam: Alert, awake, oriented x 3, afebrile, Nathaniel chest pain, Nathaniel nausea, Nathaniel vomiting; Nathaniel acute distress.  Nathaniel overnight events. Respiratory system: Clear to auscultation. Respiratory effort normal. Cardiovascular system:RRR. Nathaniel murmurs, rubs, gallops. Gastrointestinal system: Abdomen is nondistended, soft and nontender. Nathaniel organomegaly or masses felt. Normal bowel sounds heard. Central nervous system: Alert and oriented. Nathaniel focal neurological deficits. Extremities: Nathaniel C/C/E, +pedal pulses Skin: Nathaniel rashes, lesions or ulcers  Psychiatry: Judgement and insight appear normal. Mood & affect appropriate.    Data Reviewed:  CBC: Recent Labs  Lab 10/20/19 0454 10/21/19 0503 10/23/19 0510  WBC 13.1* 10.8* 9.7  NEUTROABS  --  8.9* 7.4  HGB 8.9* 9.0* 8.6*  HCT 30.3* 31.2* 28.8*  MCV 81.2 81.3 79.1*  PLT 718* 765* 738*   Basic Metabolic Panel: Recent Labs  Lab 10/20/19 0454 10/21/19 0503  NA 133* 136  K 4.2 4.3  CL 102 101  CO2 21* 22  GLUCOSE 99 104*  BUN 15 14  CREATININE 1.15 1.15  CALCIUM 8.9 9.3    GFR: Estimated Creatinine Clearance: 92.3 mL/min (by C-G formula based on SCr of 1.15 mg/dL).   Liver Function Tests: Recent Labs  Lab 10/20/19 0454  AST 44*  ALT 34  ALKPHOS 122  BILITOT 0.3  PROT 8.8*  ALBUMIN 2.5*    Radiology Studies: Nathaniel results found. Scheduled Meds: . buprenorphine-naloxone  1 tablet Sublingual BID  . Chlorhexidine Gluconate Cloth  6 each Topical Daily  . enoxaparin (LOVENOX) injection  40 mg Subcutaneous Q24H  . gabapentin  800 mg Oral TID  . hydrOXYzine  50 mg Oral TID  . methocarbamol  1,000 mg Oral TID  . nicotine  21 mg Transdermal Daily  . senna-docusate  2 tablet Oral BID  . traZODone  150 mg Oral QHS   Continuous Infusions: . sodium chloride 50 mL/hr at 10/24/19 2313  . penicillin g continuous IV infusion 9 Million Units (10/25/19 1253)    LOS: 18 days    Vassie Loll, MD [facility] [phone]   10/25/2019, 3:43 PM

## 2019-10-26 NOTE — Progress Notes (Signed)
Pharmacy Antibiotic Note  Nathaniel Hansen is a 33 y.o. male admitted on 10/07/2019 with streptococcus bacteremia/endocarditis.  Pharmacy has been consulted for Penicillin G and Gentmicin dosing. BCx + streptococcus mitis and 2D ECHO is positive for vegetation.  ID2 weeks of penicillin G and gentamicin for synergy.  May need 4-6 weeks of IV antibiotics. Patient still febrile. Gentamicin trough is <68mcg/ml. Scr is stable. Completed Gentamicin 12/1  Plan: Continue penicillin G IV 18 million units continuous infusion Gentamicin 3mg /kg/day once daily dosing 220mg  for 2 weeks(last day 12/2) F/U cxs and clinical progress Monitor V/S, labs and levels as indicated  Height: 5\' 11"  (180.3 cm) Weight: 154 lb 8.7 oz (70.1 kg) IBW/kg (Calculated) : 75.3  Temp (24hrs), Avg:98.2 F (36.8 C), Min:98.1 F (36.7 C), Max:98.2 F (36.8 C)  Recent Labs  Lab 10/20/19 0454 10/21/19 0503 10/21/19 1455 10/23/19 0510  WBC 13.1* 10.8*  --  9.7  CREATININE 1.15 1.15  --   --   GENTTROUGH  --   --  <0.5*  --     Estimated Creatinine Clearance: 92.3 mL/min (by C-G formula based on SCr of 1.15 mg/dL).    No Known Allergies  Antimicrobials this admission: Vancomycin 11/16>>11/8 Ceftriaxone 11/16 >>11/18 Azithromycin 11/16>> 11/18 Cefazolin 11/18>> 11/20 Penicillin G 11/20 >> Gentamicin 11/18>> 12/1 Levaquin 11/21 >> 11/27   Dose adjustments this admission: n/a   Microbiology results: 11/22 Bcx: ngtd 11/18 Bcx: ngtd 11/16 BCx: streptococcus mitis 11/16 UCx: no growth  MRSA PCR: negative   Thank you for allowing pharmacy to be a part of this patient's care.  Thomasenia Sales, PharmD, MBA, BCGP Clinical Pharmacist  10/26/2019 1:42 PM

## 2019-10-26 NOTE — Plan of Care (Signed)
  Problem: Education: Goal: Knowledge of General Education information will improve Description: Including pain rating scale, medication(s)/side effects and non-pharmacologic comfort measures Outcome: Progressing   Problem: Health Behavior/Discharge Planning: Goal: Ability to manage health-related needs will improve Outcome: Progressing   Problem: Clinical Measurements: Goal: Ability to maintain clinical measurements within normal limits will improve Outcome: Progressing Goal: Will remain free from infection Outcome: Progressing Goal: Diagnostic test results will improve Outcome: Progressing Goal: Cardiovascular complication will be avoided Outcome: Progressing   Problem: Activity: Goal: Risk for activity intolerance will decrease Outcome: Progressing   Problem: Nutrition: Goal: Adequate nutrition will be maintained Outcome: Progressing   Problem: Coping: Goal: Level of anxiety will decrease Outcome: Progressing   Problem: Elimination: Goal: Will not experience complications related to bowel motility Outcome: Progressing Goal: Will not experience complications related to urinary retention Outcome: Progressing   Problem: Pain Managment: Goal: General experience of comfort will improve Outcome: Progressing   Problem: Safety: Goal: Ability to remain free from injury will improve Outcome: Progressing   Problem: Skin Integrity: Goal: Risk for impaired skin integrity will decrease Outcome: Progressing   Problem: Fluid Volume: Goal: Hemodynamic stability will improve Outcome: Progressing   Problem: Clinical Measurements: Goal: Diagnostic test results will improve Outcome: Progressing Goal: Signs and symptoms of infection will decrease Outcome: Progressing   Problem: Respiratory: Goal: Ability to maintain adequate ventilation will improve Outcome: Progressing   Problem: Fluid Volume: Goal: Hemodynamic stability will improve Outcome: Progressing   Problem:  Clinical Measurements: Goal: Diagnostic test results will improve Outcome: Progressing Goal: Signs and symptoms of infection will decrease Outcome: Progressing   Problem: Respiratory: Goal: Ability to maintain adequate ventilation will improve Outcome: Progressing   

## 2019-10-26 NOTE — Progress Notes (Signed)
Nathaniel Hansen is alert and oriented X4. He has tolerated his medications well today. Will continue to monitor patient.

## 2019-10-26 NOTE — Progress Notes (Signed)
PROGRESS NOTE   Nathaniel Hansen  ERX:[id number] DOB: Jan 01, 1986 DOA: 10/07/2019 PCP: Patient, No Pcp Per    Brief Narrative:  33 year old male with history of  IVDU admitted to the hospital with septic shock due to Strep Oralis Bacteremia as well as severe anemia.  Hemoglobin noted to be low at 5.7.  He was transfused 2 units of PRBC.  He initially required IV fluids and vasopressors.  weaned off of Levophed.   - Blood cx from 10/07/2019- STREPTOCOCCUS MITIS/ORALIS  -TTE- on 10/09/19 with tricuspid valve vegetation -Repeat blood cultures from 10/09/2019 and 10/13/19 NGTD --Plan is for 2 more weeks of IV antibiotics at this point.   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    - MRI of C-spine and L-spine on 10/09/19 without acute findings, specifically no discitis or epidural infection -Initially received Rocephin and vancomycin (both stopped) -Repeat blood cultures 10/09/2019 and 10/13/2019  NGTD  --Off vasopressors  - cortisol normal,  HIV antibody nonreactive -Tricuspid vegetation measures 1.2 x 1.2 cm with moderate to severe tricuspid  regurgitation -Discussed with cardiologist Dr. Wyline Mood who read the TTE, at this time no need for CT surgery consult, and no need for TEE - Discussed with Dr. Felipe Drone from ID on 10/09/2019-recommends IV Ancef or PCN G along with synergistic IV gentamycin) therapy for 2 weeks then repeat TTE, if tricuspid valve vegetation is less than 1 cm at that time may consider Ancef or PCN G monotherapy for additional 2 weeks for total of 4 weeks of therapy as long as blood cultures are negative and sepsis pathophysiology including fever and leukocytosis resolved.   -Please see consult note from Dr. Lorenso Courier dated 10/09/2019 -Vancomycin 11/16>>11/8 Ceftriaxone 11/16>>11/18 Azithromycin 11/16>>11/18 Cefazolin 11/18>> 11/20 Penicillin G 11/20 >> Gentamicin 11/18>> for 2 weeks for synergy -Plan is for 2 more weeks of IV antibiotics at this point using only PEN G. -repeat echo demonstrating improved/resolved vegetation.  2)Bilateral Community-acquired pneumonia - TREATED and resolved. -Clinically, patient appears to be improved.  Fevers are gone, he is not short of breath or coughing. Normal O2 sat on RA. -repeat chest x-ray on 10/12/2019 with bilateral pneumonia left more than right -WBC count is WNL --patient completed Levaquin for CAP on 11/21> 11/28. -repeat CXR in 6 weeks to assure infiltrates resolution.  3) acute on chronic symptomatic anemia  ---Anemia panel indicates some degree of chronic disease.  May have a component of anemia of critical illness.  Hemoglobin is up to 9.3 from 6.8, patient has received transfusion of PRBC this admission (total 3 units PRBC). -He does not have any evidence of overt bleeding.  No significant evidence of hemolysis.  -Stool occult blood is negative -History of  low MCV and low MCH with elevated RDW -Continue to follow hemoglobin trend and transfuse as needed.  4)Chronic back pain / opioid dependence -  Pt strongly motivated not to return to injecting heroin.  He  had reportedly been compliant with a suboxone treatment program for the month prior to admission and had not injected heroin or use any other recreational drugs during this time.Marland Kitchen.  He would like to be maintained on suboxone which is very reasonable.  He has been restarted on suboxone with hopes of preventing relapse.   -MRI of the C-spine as well as lumbar spine without discitis or epidural abscess -Continue gabapentin, methocarbamol and as needed Tylenol, along with current dose of Suboxone.  Resume outpatient pain clinic management at discharge.  5) Leukocytosis - WBC has trended down and normalized with current treatment.  6) Fever -associated with endocarditis process; at this moment resolved and patient has remained afebrile.  Continue current IV antibiotics.  DVT prophylaxis: Lovenox Code Status: Full code Family Communication: Patient is alert and coherent  Disposition Plan: TTE on 10/24/2019 demonstrated significant improvement/ almost complete resolution of tricuspid vegetations.  Continue 2 more weeks of IV antibiotics as per ID recommendations.  Continue supportive care.  -Please see ID consult on recommendations  Consultants:   Phone consult with Dr. Wyline MoodBranch from cardiology service who read echo 10/09/2019  -Dr. Felipe DroneNick Powers from infectious disease 10/09/2019  Procedures:   Right IJ central line placement 11/16 > removed -10/09/2019 Echo-TTE--Tricuspid vegetation measures 1.2 x 1.2 cm with mild to moderate tricuspid regurgitation   10/24/19 2D Echo: normal EF, appears to demonstrated significant improvement in tricuspid vegetation and now just residual thickening and moderate regurgitation . No LVH  Antimicrobials:  -Vancomycin 11/16>>11/8 Ceftriaxone 11/16>>11/18 Azithromycin 11/16>>11/18 Cefazolin 11/18>> 11/20 Penicillin G 11/20 >> Gentamicin 11/18>> for 2 weeks for synergy; stopped on 10/24/19 Added Levaquin for CAP on 10/12/2019> 11/28  Subjective: Feeling good,  no fever, no CP, no nausea, no vomiting. Reported pain regimen is controlling symptoms. Motivated to finish antibiotic therapy to go home.    Objective: Vitals:   10/25/19 0526 10/25/19 1457 10/25/19 2146 10/26/19 0639  BP: (!) 102/57 (!) 96/57 108/70 123/77  Pulse: 88 94 97 91  Resp: 17 18 20 16   Temp: 98.6 F (37 C) 98.2 F (36.8 C) 98.2 F (36.8 C) 98.1 F (36.7 C)  TempSrc:   Oral Oral  SpO2: 96% 98% 100% 100%  Weight:      Height:       Temp:  [98.1 F (36.7 C)-98.2 F (36.8 C)] 98.1 F (36.7 C) (12/05 0639) Pulse Rate:  [91-97] 91 (12/05 0639) Resp:  [16-20] 16 (12/05 0639) BP: (96-123)/(57-77) 123/77 (12/05 0639) SpO2:  [98 %-100 %] 100 % (12/05 0639)   Intake/Output Summary (Last 24 hours) at 10/26/2019 0821 Last data filed at 10/26/2019 0640 Gross per 24 hour  Intake 4016.43 ml  Output -  Net 4016.43 ml   Filed Weights   10/23/19 0448 10/24/19 0500 10/25/19 0500  Weight: 70.9 kg 69.2 kg 70.1 kg    Examination: General exam: Alert, awake, oriented x 3; in no distress and reported no acute complaints currently.  Respiratory system: Clear to auscultation. Respiratory effort normal. Cardiovascular system:RRR. No murmurs, rubs, gallops. Gastrointestinal system: Abdomen is nondistended, soft and nontender. No organomegaly or masses felt. Normal bowel sounds heard. Central nervous system: Alert and oriented. No focal neurological deficits. Extremities: No C/C/E, +pedal pulses Skin: No rashes, lesions or ulcers Psychiatry: Judgement and  insight appear normal. Mood & affect appropriate.    Data Reviewed:  CBC: Recent Labs  Lab 10/20/19 0454 10/21/19 0503 10/23/19 0510  WBC 13.1* 10.8* 9.7  NEUTROABS  --  8.9* 7.4  HGB 8.9* 9.0* 8.6*  HCT 30.3* 31.2* 28.8*  MCV 81.2 81.3 79.1*  PLT 718* 765* 267*   Basic Metabolic Panel: Recent Labs  Lab 10/20/19 0454 10/21/19 0503  NA 133* 136  K 4.2 4.3  CL 102 101  CO2 21* 22  GLUCOSE 99 104*  BUN 15 14   CREATININE 1.15 1.15  CALCIUM 8.9 9.3   GFR: Estimated Creatinine Clearance: 92.3 mL/min (by C-G formula based on SCr of 1.15 mg/dL).   Liver Function Tests: Recent Labs  Lab 10/20/19 0454  AST 44*  ALT 34  ALKPHOS 122  BILITOT 0.3  PROT 8.8*  ALBUMIN 2.5*    Radiology Studies: No results found. Scheduled Meds: . buprenorphine-naloxone  1 tablet Sublingual BID  . Chlorhexidine Gluconate Cloth  6 each Topical Daily  . enoxaparin (LOVENOX) injection  40 mg Subcutaneous Q24H  . gabapentin  800 mg Oral TID  . hydrOXYzine  50 mg Oral TID  . methocarbamol  1,000 mg Oral TID  . nicotine  21 mg Transdermal Daily  . senna-docusate  2 tablet Oral BID  . traZODone  150 mg Oral QHS   Continuous Infusions: . sodium chloride 50 mL/hr at 10/24/19 2313  . penicillin g continuous IV infusion 9 Million Units (10/25/19 2355)    LOS: 19 days    Barton Dubois, MD [facility] [phone]   10/26/2019, 8:21 AM

## 2019-10-27 DIAGNOSIS — A409 Streptococcal sepsis, unspecified: Secondary | ICD-10-CM

## 2019-10-27 LAB — COMPREHENSIVE METABOLIC PANEL
ALT: 16 U/L (ref 0–44)
AST: 11 U/L — ABNORMAL LOW (ref 15–41)
Albumin: 2.3 g/dL — ABNORMAL LOW (ref 3.5–5.0)
Alkaline Phosphatase: 92 U/L (ref 38–126)
Anion gap: 11 (ref 5–15)
BUN: 15 mg/dL (ref 6–20)
CO2: 26 mmol/L (ref 22–32)
Calcium: 8.8 mg/dL — ABNORMAL LOW (ref 8.9–10.3)
Chloride: 99 mmol/L (ref 98–111)
Creatinine, Ser: 0.98 mg/dL (ref 0.61–1.24)
GFR calc Af Amer: >60 mL/min (ref >60)
GFR calc non Af Amer: >60 mL/min (ref >60)
Glucose, Bld: 103 mg/dL — ABNORMAL HIGH (ref 70–99)
Potassium: 4.1 mmol/L (ref 3.5–5.1)
Sodium: 136 mmol/L (ref 135–145)
Total Bilirubin: 0.1 mg/dL — ABNORMAL LOW (ref 0.3–1.2)
Total Protein: 7.9 g/dL (ref 6.5–8.1)

## 2019-10-27 LAB — CBC
HCT: 27.5 % — ABNORMAL LOW (ref 39.0–52.0)
Hemoglobin: 8 g/dL — ABNORMAL LOW (ref 13.0–17.0)
MCH: 23.6 pg — ABNORMAL LOW (ref 26.0–34.0)
MCHC: 29.1 g/dL — ABNORMAL LOW (ref 30.0–36.0)
MCV: 81.1 fL (ref 80.0–100.0)
Platelets: 644 10*3/uL — ABNORMAL HIGH (ref 150–400)
RBC: 3.39 MIL/uL — ABNORMAL LOW (ref 4.22–5.81)
RDW: 21.8 % — ABNORMAL HIGH (ref 11.5–15.5)
WBC: 6.8 10*3/uL (ref 4.0–10.5)
nRBC: 0 % (ref 0.0–0.2)

## 2019-10-27 LAB — MAGNESIUM: Magnesium: 2 mg/dL (ref 1.7–2.4)

## 2019-10-27 NOTE — Progress Notes (Signed)
PROGRESS NOTE  Shella SpearingStedman L Mcenroe ZOX:[id number] DOB: [birth year] DOA: 10/07/2019 PCP: Patient, No Pcp Per  Brief History:  33 year old male with history of  IVDU and chronic back pain admitted to the hospital with septic shock due to Strep mitis Bacteremia as well as severe anemia.  Hemoglobin noted to be low at 5.7.  He was transfused 3 units of PRBC during this hospitalization.  Patient does have a previous history of IV drug use and reports last time he used drugs was approximately 1-1/2 months ago.  He reports history of alcohol use, last time he drank was approximately 3 months ago.He initially required IV fluids and vasopressors.  Weaned off of Levophed.   - Blood cx from 10/07/2019- STREPTOCOCCUS MITIS/ORALIS  -TTE- on 10/09/19 with tricuspid valve vegetation -Repeat blood cultures from 10/09/2019 and 10/13/19 NGTD  Assessment/Plan: Severe Sepsis -due to Streptococcal bacteremia -initially on levophed -lactate peacked 2.5 -PCT 14.17 -received 2 weeks PCN and gent -sepsis physiology resolved  Tricuspid Valve Endocarditis / STREPTOCOCCUS MITIS/ORALIS Bacteremia -blood cultures from 10/07/2019 with pansensitive strep  -source of bacteremia is presumed to be IVDU -Patient has history of  intravenous drug use but had been on a suboxone treatment program for at least 1 month prior to admission  - MRI of C-spine and L-spine on 10/09/19 without acute findings, specifically no discitis or epidural infection -Initially received Rocephin and vancomycin (both stopped) -Repeat blood cultures 10/09/2019 and 10/13/2019  NGTD -Off vasopressors  -cortisol normal,  HIV antibody nonreactive -11/18-Echo EF 60-65%--Tricuspid vegetation measures 1.2 x 1.2 cm with moderate to severe tricuspid regurgitation -Discussed with cardiologist Dr. Wyline MoodBranch who read the TTE, at this time no need for CT surgery consult, and no need for TEE - Discussed with Dr. Felipe DroneNick Powers from ID on  10/09/2019-recommends IV Ancef or PCN G with IV gent therapy for 2 weeks then repeat TTE, if tricuspid valve vegetation is less than 1 cm at that time may consider Ancef or PCN G monotherapy for additional 2 weeks for total of 4 weeks of therapy as long as blood cultures are negative and sepsis pathophysiology including fever and leukocytosis resolved.   -Vancomycin 11/16>>11/8 Ceftriaxone 11/16>>11/18 Azithromycin 11/16>>11/18 Cefazolin 11/18>>11/20 Penicillin G 11/20 >> Gentamicin 11/18>> for 2 weeks for synergy -12/3/--repeat echo demonstrating improved/resolved vegetation. -continue IV Pen G continuous infusion through 11/06/19  [place] Community-acquired pneumonia  -TREATED and resolved. -Clinically, patient appears to be improved.  Fevers are gone, he is not short of breath or coughing. Normal O2 sat on RA. -repeat chest x-ray on 10/12/2019 with bilateral pneumonia left more than right -WBC count is WNL --patient completed Levaquin for CAP on 11/21> 11/28. -repeat CXR in 6 weeks to assure infiltrates resolution.  acute on chronic symptomatic anemia /Anemia of Chronic Disease --Anemia panel indicates some degree of chronic dz 0patient has received transfusion of PRBC this admission (total 3 units PRBC). -Stool occult blood is negative -History of low MCV and low MCH with elevated RDW -B12--315 -folate 14.9 -iron saturation 3%, ferritin 248  Chronic back pain / opioid dependence  -motivated not to return to injecting heroin.   -He had reportedly been compliant with a suboxone treatment program for the month prior to admission and had not injected heroin during this time.Marland Kitchen.  He would like to be maintained on suboxone which is very reasonable.  He has been restarted on suboxone with hopes of preventing relapse.   -MRI of the C-spine as well  as lumbar spine without discitis or epidural abscess -Continue gabapentin, methocarbamol and as needed Tylenol, along with current dose of  Suboxone.  Resume outpatient pain clinic management at discharge. -PMP Aware queried--no chronic controlled substance prescriptions     Disposition Plan:   Home 11/07/19  Family Communication:   No Family at bedside  Consultants:  none  Code Status:  FULL   DVT Prophylaxis:[place] Lovenox   Procedures: As Listed in Progress Note Above  Antibiotics: As above       Subjective: Patient denies fevers, chills, headache, chest pain, dyspnea, nausea, vomiting, diarrhea, abdominal pain, dysuria, hematuria, hematochezia, and melena.   Objective: Vitals:   10/26/19 2152 10/27/19 0253 10/27/19 0524 10/27/19 1457  BP: 129/75  110/61 102/73  Pulse: (!) 101  86 95  Resp: 20  13 18   Temp: 98.6 F (37 C)  98.8 F (37.1 C) 98.5 F (36.9 C)  TempSrc: Oral  Oral Oral  SpO2: 100%  98% 100%  Weight:  72.6 kg    Height:        Intake/Output Summary (Last 24 hours) at 10/27/2019 1516 Last data filed at 10/27/2019 1229 Gross per 24 hour  Intake 720 ml  Output 500 ml  Net 220 ml   Weight change:  Exam:   General:  Pt is alert, follows commands appropriately, not in acute distress  HEENT: No icterus, No thrush, No neck mass, [place]/AT  Cardiovascular: RRR, S1/S2, no rubs, no gallops  Respiratory: diminished breath sounds.  No crackles or wheeze  Abdomen: Soft/+BS, non tender, non distended, no guarding  Extremities: No edema, No lymphangitis, No petechiae, No rashes, no synovitis   Data Reviewed: I have personally reviewed following labs and imaging studies Basic Metabolic Panel: Recent Labs  Lab 10/21/19 0503 10/27/19 0708  NA 136 136  K 4.3 4.1  CL 101 99  CO2 22 26  GLUCOSE 104* 103*  BUN 14 15  CREATININE 1.15 0.98  CALCIUM 9.3 8.8*  MG  --  2.0   Liver Function Tests: Recent Labs  Lab 10/27/19 0708  AST 11*  ALT 16  ALKPHOS 92  BILITOT 0.1*  PROT 7.9  ALBUMIN 2.3*   No results for input(s): LIPASE, AMYLASE in the last 168 hours. No results for  input(s): AMMONIA in the last 168 hours. Coagulation Profile: No results for input(s): INR, PROTIME in the last 168 hours. CBC: Recent Labs  Lab 10/21/19 0503 10/23/19 0510 10/27/19 0708  WBC 10.8* 9.7 6.8  NEUTROABS 8.9* 7.4  --   HGB 9.0* 8.6* 8.0*  HCT 31.2* 28.8* 27.5*  MCV 81.3 79.1* 81.1  PLT 765* 738* 644*   Cardiac Enzymes: No results for input(s): CKTOTAL, CKMB, CKMBINDEX, TROPONINI in the last 168 hours. BNP: Invalid input(s): POCBNP CBG: No results for input(s): GLUCAP in the last 168 hours. HbA1C: No results for input(s): HGBA1C in the last 72 hours. Urine analysis:    Component Value Date/Time   COLORURINE YELLOW 10/07/2019 1056   APPEARANCEUR CLEAR 10/07/2019 1056   LABSPEC 1.012 10/07/2019 1056   PHURINE 5.0 10/07/2019 1056   GLUCOSEU NEGATIVE 10/07/2019 1056   HGBUR NEGATIVE 10/07/2019 1056   BILIRUBINUR NEGATIVE 10/07/2019 1056   KETONESUR NEGATIVE 10/07/2019 1056   PROTEINUR NEGATIVE 10/07/2019 1056   NITRITE NEGATIVE 10/07/2019 1056   LEUKOCYTESUR NEGATIVE 10/07/2019 1056   Sepsis Labs: @LABRCNTIP (procalcitonin:4,lacticidven:4) )No results found for this or any previous visit (from the past 240 hour(s)).   Scheduled Meds: . buprenorphine-naloxone  1 tablet Sublingual  BID  . Chlorhexidine Gluconate Cloth  6 each Topical Daily  . enoxaparin (LOVENOX) injection  40 mg Subcutaneous Q24H  . gabapentin  800 mg Oral TID  . hydrOXYzine  50 mg Oral TID  . methocarbamol  1,000 mg Oral TID  . nicotine  21 mg Transdermal Daily  . senna-docusate  2 tablet Oral BID  . traZODone  150 mg Oral QHS   Continuous Infusions: . sodium chloride 50 mL/hr at 10/26/19 1819  . penicillin g continuous IV infusion 9 Million Units (10/27/19 1235)    Procedures/Studies: Cxr  Result Date: 10/12/2019 CLINICAL DATA:  Dyspnea EXAM: CHEST - 2 VIEW COMPARISON:  Chest radiograph dated 10/07/2019 FINDINGS: The heart and mediastinum appear normal. There is moderate left  basilar atelectasis/airspace disease. There is mild right basilar atelectasis/airspace disease. A trace left pleural effusion may contribute. There is no pneumothorax. The osseous structures are intact. IMPRESSION: Bibasilar atelectasis/airspace disease, greater on the left than the right. A trace left pleural effusion may contribute. Electronically Signed   By: Romona Curls M.D.   On: 10/12/2019 11:16   Mr Cervical Spine W Wo Contrast  Result Date: 10/09/2019 CLINICAL DATA:  Septic shock, neck pain EXAM: MRI CERVICAL SPINE WITHOUT AND WITH CONTRAST TECHNIQUE: Multiplanar and multiecho pulse sequences of the cervical spine, to include the craniocervical junction and cervicothoracic junction, were obtained without and with intravenous contrast. CONTRAST:  8mL GADAVIST GADOBUTROL 1 MMOL/ML IV SOLN COMPARISON:  None. FINDINGS: There is decreased signal and motion artifact. Findings below are within this limitation. Alignment: Anteroposterior alignment is maintained. Vertebrae: Vertebral body heights are maintained. No definite marrow edema or abnormal enhancement. Cord: No abnormal cord signal.  No epidural collection. Posterior Fossa, vertebral arteries, paraspinal tissues: Unremarkable. Disc levels: Small right paracentral protrusion at C6-C7. There is no significant canal or neural foraminal stenosis at any level. IMPRESSION: Suboptimal evaluation due to artifact. No evidence of discitis/osteomyelitis or epidural collection. Electronically Signed   By: Guadlupe Spanish M.D.   On: 10/09/2019 10:18   Mr Lumbar Spine W Wo Contrast  Result Date: 10/09/2019 CLINICAL DATA:  Bacteremia, back pain EXAM: MRI LUMBAR SPINE WITHOUT AND WITH CONTRAST TECHNIQUE: Multiplanar and multiecho pulse sequences of the lumbar spine were obtained without and with intravenous contrast. CONTRAST:  8mL GADAVIST GADOBUTROL 1 MMOL/ML IV SOLN COMPARISON:  None. FINDINGS: Segmentation:  Standard. Alignment: Lumbar lordosis is preserved.  Anteroposterior alignment is maintained. Vertebrae: Diffusely decreased T1 marrow signal likely reflecting hematopoietic marrow response to anemia. There is no marrow edema. Vertebral body heights are maintained. Conus medullaris and cauda equina: Conus extends to the L1-L2 level. Conus and cauda equina appear normal. Paraspinal and other soft tissues: Unremarkable. Disc levels: Disc desiccation and height loss L5-S1. Disc bulge with superimposed right central/subarticular protrusion with endplate osteophytic ridging. No canal stenosis. Narrowing the left lateral recess with potential compression of the traversing left S1 nerve root. Moderate foraminal stenosis. IMPRESSION: No evidence of osteomyelitis/discitis or epidural collection. Degenerative changes at L5-S1 as described. Electronically Signed   By: Guadlupe Spanish M.D.   On: 10/09/2019 10:27   Dg Chest Port 1 View  Result Date: 10/07/2019 CLINICAL DATA:  Central line placement. EXAM: PORTABLE CHEST 1 VIEW COMPARISON:  10/07/2019 at 11:40 a.m. FINDINGS: A new right jugular catheter terminates near the superior cavoatrial junction. There is persistent airspace opacity in the left lung base with associated volume loss, not significantly changed. No new airspace consolidation, edema, pleural effusion, or pneumothorax is identified. No acute osseous  abnormality is seen. IMPRESSION: 1. New right jugular catheter terminating near the superior cavoatrial junction. No pneumothorax. 2. Unchanged left basilar airspace opacity. Electronically Signed   By: Logan Bores M.D.   On: 10/07/2019 14:43   Dg Chest Port 1 View  Result Date: 10/07/2019 CLINICAL DATA:  Chest pain on the left with cough EXAM: PORTABLE CHEST 1 VIEW COMPARISON:  September 01, 2019 FINDINGS: There is patchy airspace opacity consistent with pneumonia in the left base. The lungs elsewhere are clear. Heart size and pulmonary vascularity are within normal limits. No adenopathy. No bone lesions.  IMPRESSION: Airspace opacity consistent with pneumonia left base. Lungs elsewhere clear. Cardiac silhouette within normal limits. No evident adenopathy. Electronically Signed   By: Lowella Grip III M.D.   On: 10/07/2019 11:50    Orson Eva, DO  [facility] Pager [phone]  If 7PM-7AM, please contact night-coverage [email or web address] Password [email or web address] 10/27/2019, 3:16 PM   LOS: 20 days

## 2019-10-28 ENCOUNTER — Inpatient Hospital Stay (HOSPITAL_COMMUNITY): Payer: Self-pay

## 2019-10-28 NOTE — Progress Notes (Signed)
PROGRESS NOTE  Nathaniel Hansen QPY:[id number] DOB: 1986/02/05 DOA: 10/07/2019 PCP: Patient, No Pcp Per  Brief History:  33 year old male with history of IVDU and chronic back pain admitted to the hospital with septic shock due to Strep mitis Bacteremia as well as severe anemia. Hemoglobin noted to be low at 5.7. He was transfused 3 units of PRBC during this hospitalization. Patient does have a previous history of IV drug use and reports last time he used drugs was approximately 1-1/2 months ago. He reports history of alcohol use, last time he drank was approximately 3 months ago.He initially required IV fluids and vasopressors. Weaned off of Levophed.  - Blood cx from 10/07/2019- STREPTOCOCCUS MITIS/ORALIS  -TTE- on 10/09/19 with tricuspid valve vegetation -Repeat blood cultures from 10/09/2019 and 10/13/19 NGTD  Assessment/Plan: Severe Sepsis -due to Streptococcal bacteremia -initially on levophed -lactate peacked 2.5 -PCT 14.17 -received 2 weeks PCN and gent -sepsis physiology resolved  Tricuspid Valve Endocarditis / STREPTOCOCCUS MITIS/ORALIS Bacteremia -blood cultures from 10/07/2019 with pansensitive strep  -source of bacteremia is presumed to be IVDU -Patient has history of intravenous drug use but had been on a suboxone treatment program for at least 1 month prior to admission  - MRI of C-spine and L-spine on 10/09/19 without acute findings, specifically no discitis or epidural infection -Initially received Rocephin and vancomycin (both stopped) -Repeat blood cultures 10/09/2019 and 10/13/2019 NGTD -Off vasopressors  -cortisol normal, HIV antibody nonreactive -11/18-Echo EF 60-65%--Tricuspid vegetation measures 1.2 x 1.2 cm with moderate to severe tricuspid regurgitation -Discussed with cardiologist Dr. Harl Bowie who read the TTE, at this time no need for CT surgery consult, and no need for TEE - Discussed with Dr. Catalina Antigua from ID on  10/09/2019-recommends IV Ancef or PCN G with IV gent therapy for 2 weeks then repeat TTE, if tricuspid valve vegetation is less than 1 cm at that time may consider Ancef or PCN G monotherapy for additional 2 weeks for total of 4 weeks of therapy as long as blood cultures are negative and sepsis pathophysiology including fever and leukocytosis resolved.  -Vancomycin 11/16>>11/8 Ceftriaxone 11/16>>11/18 Azithromycin 11/16>>11/18 Cefazolin 11/18>>11/20 Penicillin G 11/20 >> Gentamicin 11/18>> for 2 weeks for synergy -12/3/--repeat echo demonstrating improved/resolved vegetation. -continue IV Pen G continuous infusion through 11/06/19  [facility]-acquired pneumonia  -TREATED and resolved. -Clinically, patient appears to be improved. Fevers are gone, he is not short of breath or coughing. Normal O2 sat on RA. -repeat chest x-ray on 10/12/2019 with bilateral pneumonia left more than right -WBC count is WNL --patient completed Levaquin for CAP on 11/21> 11/28. -repeat CXR in 6 weeks to assure infiltrates resolution.  acute on chronic symptomatic anemia /Anemia of Chronic Disease --Anemia panel indicates some degree of chronic dz 0patient has received transfusion of PRBC this admission (total 3 units PRBC). -Stool occult blood is negative -History of low MCV and low MCH with elevated RDW -B12--315 -folate 14.9 -iron saturation 3%, ferritin 248  Chronic back pain / opioid dependence  -motivated not to return to injecting heroin.  -He had reportedly been compliant with a suboxone treatment program for the month prior to admission and had not injected heroin during this time.Marland Kitchen He would like to be maintained on suboxone which is very reasonable. He has been restarted on suboxone with hopes of preventing relapse.  -MRI of the C-spine as well as lumbar spine without discitis or epidural abscess -Continue gabapentin, methocarbamol and as needed Tylenol, along  with current dose of  Suboxone. Resume outpatient pain clinic management at discharge. -PMP Aware queried--no chronic controlled substance prescriptions  Abd pain -2 view AXR to start -UA and culture   Disposition Plan:   Home 11/07/19  Family Communication:   No Family at bedside  Consultants:  none  Code Status:  FULL   DVT Prophylaxis:[place] Lovenox   Procedures: As Listed in Progress Note Above  Antibiotics: As above      Subjective: Pt complains of LLQ abdominal pain.  Had BM yesterday.  Denies f/c, cp, sob, dysuria, n/v/d, hematochezia  Objective: Vitals:   10/27/19 2152 10/28/19 0500 10/28/19 0700 10/28/19 1426  BP: (!) 108/57  131/66 103/62  Pulse: 92  83 98  Resp:   18 18  Temp: 99.7 F (37.6 C)  98.3 F (36.8 C) 98.1 F (36.7 C)  TempSrc: Oral     SpO2: 96%  99% 100%  Weight:  72.7 kg 71.9 kg   Height:        Intake/Output Summary (Last 24 hours) at 10/28/2019 1703 Last data filed at 10/28/2019 1300 Gross per 24 hour  Intake 360 ml  Output -  Net 360 ml   Weight change: 0.1 kg Exam:   General:  Pt is alert, follows commands appropriately, not in acute distress  HEENT: No icterus, No thrush, No neck mass, [place]/AT  Cardiovascular: RRR, S1/S2, no rubs, no gallops  Respiratory: CTA bilaterally, no wheezing, no crackles, no rhonchi  Abdomen: Soft/+BS, non tender, non distended, no guarding  Extremities: No edema, No lymphangitis, No petechiae, No rashes, no synovitis   Data Reviewed: I have personally reviewed following labs and imaging studies Basic Metabolic Panel: Recent Labs  Lab 10/27/19 0708  NA 136  K 4.1  CL 99  CO2 26  GLUCOSE 103*  BUN 15  CREATININE 0.98  CALCIUM 8.8*  MG 2.0   Liver Function Tests: Recent Labs  Lab 10/27/19 0708  AST 11*  ALT 16  ALKPHOS 92  BILITOT 0.1*  PROT 7.9  ALBUMIN 2.3*   No results for input(s): LIPASE, AMYLASE in the last 168 hours. No results for input(s): AMMONIA in the last 168 hours.  Coagulation Profile: No results for input(s): INR, PROTIME in the last 168 hours. CBC: Recent Labs  Lab 10/23/19 0510 10/27/19 0708  WBC 9.7 6.8  NEUTROABS 7.4  --   HGB 8.6* 8.0*  HCT 28.8* 27.5*  MCV 79.1* 81.1  PLT 738* 644*   Cardiac Enzymes: No results for input(s): CKTOTAL, CKMB, CKMBINDEX, TROPONINI in the last 168 hours. BNP: Invalid input(s): POCBNP CBG: No results for input(s): GLUCAP in the last 168 hours. HbA1C: No results for input(s): HGBA1C in the last 72 hours. Urine analysis:    Component Value Date/Time   COLORURINE YELLOW 10/07/2019 1056   APPEARANCEUR CLEAR 10/07/2019 1056   LABSPEC 1.012 10/07/2019 1056   PHURINE 5.0 10/07/2019 1056   GLUCOSEU NEGATIVE 10/07/2019 1056   HGBUR NEGATIVE 10/07/2019 1056   BILIRUBINUR NEGATIVE 10/07/2019 1056   KETONESUR NEGATIVE 10/07/2019 1056   PROTEINUR NEGATIVE 10/07/2019 1056   NITRITE NEGATIVE 10/07/2019 1056   LEUKOCYTESUR NEGATIVE 10/07/2019 1056   Sepsis Labs: @LABRCNTIP (procalcitonin:4,lacticidven:4) )No results found for this or any previous visit (from the past 240 hour(s)).   Scheduled Meds: . buprenorphine-naloxone  1 tablet Sublingual BID  . Chlorhexidine Gluconate Cloth  6 each Topical Daily  . enoxaparin (LOVENOX) injection  40 mg Subcutaneous Q24H  . gabapentin  800 mg Oral TID  .  hydrOXYzine  50 mg Oral TID  . methocarbamol  1,000 mg Oral TID  . nicotine  21 mg Transdermal Daily  . senna-docusate  2 tablet Oral BID  . traZODone  150 mg Oral QHS   Continuous Infusions: . sodium chloride 50 mL/hr at 10/28/19 1210  . penicillin g continuous IV infusion 9 Million Units (10/28/19 1210)    Procedures/Studies: Cxr  Result Date: 10/12/2019 CLINICAL DATA:  Dyspnea EXAM: CHEST - 2 VIEW COMPARISON:  Chest radiograph dated 10/07/2019 FINDINGS: The heart and mediastinum appear normal. There is moderate left basilar atelectasis/airspace disease. There is mild right basilar atelectasis/airspace  disease. A trace left pleural effusion may contribute. There is no pneumothorax. The osseous structures are intact. IMPRESSION: Bibasilar atelectasis/airspace disease, greater on the left than the right. A trace left pleural effusion may contribute. Electronically Signed   By: Romona Curls M.D.   On: 10/12/2019 11:16   Mr Cervical Spine W Wo Contrast  Result Date: 10/09/2019 CLINICAL DATA:  Septic shock, neck pain EXAM: MRI CERVICAL SPINE WITHOUT AND WITH CONTRAST TECHNIQUE: Multiplanar and multiecho pulse sequences of the cervical spine, to include the craniocervical junction and cervicothoracic junction, were obtained without and with intravenous contrast. CONTRAST:  8mL GADAVIST GADOBUTROL 1 MMOL/ML IV SOLN COMPARISON:  None. FINDINGS: There is decreased signal and motion artifact. Findings below are within this limitation. Alignment: Anteroposterior alignment is maintained. Vertebrae: Vertebral body heights are maintained. No definite marrow edema or abnormal enhancement. Cord: No abnormal cord signal.  No epidural collection. Posterior Fossa, vertebral arteries, paraspinal tissues: Unremarkable. Disc levels: Small right paracentral protrusion at C6-C7. There is no significant canal or neural foraminal stenosis at any level. IMPRESSION: Suboptimal evaluation due to artifact. No evidence of discitis/osteomyelitis or epidural collection. Electronically Signed   By: Guadlupe Spanish M.D.   On: 10/09/2019 10:18   Mr Lumbar Spine W Wo Contrast  Result Date: 10/09/2019 CLINICAL DATA:  Bacteremia, back pain EXAM: MRI LUMBAR SPINE WITHOUT AND WITH CONTRAST TECHNIQUE: Multiplanar and multiecho pulse sequences of the lumbar spine were obtained without and with intravenous contrast. CONTRAST:  8mL GADAVIST GADOBUTROL 1 MMOL/ML IV SOLN COMPARISON:  None. FINDINGS: Segmentation:  Standard. Alignment: Lumbar lordosis is preserved. Anteroposterior alignment is maintained. Vertebrae: Diffusely decreased T1 marrow  signal likely reflecting hematopoietic marrow response to anemia. There is no marrow edema. Vertebral body heights are maintained. Conus medullaris and cauda equina: Conus extends to the L1-L2 level. Conus and cauda equina appear normal. Paraspinal and other soft tissues: Unremarkable. Disc levels: Disc desiccation and height loss L5-S1. Disc bulge with superimposed right central/subarticular protrusion with endplate osteophytic ridging. No canal stenosis. Narrowing the left lateral recess with potential compression of the traversing left S1 nerve root. Moderate foraminal stenosis. IMPRESSION: No evidence of osteomyelitis/discitis or epidural collection. Degenerative changes at L5-S1 as described. Electronically Signed   By: Guadlupe Spanish M.D.   On: 10/09/2019 10:27   Dg Chest Port 1 View  Result Date: 10/07/2019 CLINICAL DATA:  Central line placement. EXAM: PORTABLE CHEST 1 VIEW COMPARISON:  10/07/2019 at 11:40 a.m. FINDINGS: A new right jugular catheter terminates near the superior cavoatrial junction. There is persistent airspace opacity in the left lung base with associated volume loss, not significantly changed. No new airspace consolidation, edema, pleural effusion, or pneumothorax is identified. No acute osseous abnormality is seen. IMPRESSION: 1. New right jugular catheter terminating near the superior cavoatrial junction. No pneumothorax. 2. Unchanged left basilar airspace opacity. Electronically Signed   By: Sebastian Ache  M.D.   On: 10/07/2019 14:43   Dg Chest Port 1 View  Result Date: 10/07/2019 CLINICAL DATA:  Chest pain on the left with cough EXAM: PORTABLE CHEST 1 VIEW COMPARISON:  September 01, 2019 FINDINGS: There is patchy airspace opacity consistent with pneumonia in the left base. The lungs elsewhere are clear. Heart size and pulmonary vascularity are within normal limits. No adenopathy. No bone lesions. IMPRESSION: Airspace opacity consistent with pneumonia left base. Lungs elsewhere  clear. Cardiac silhouette within normal limits. No evident adenopathy. Electronically Signed   By: Bretta Bang III M.D.   On: 10/07/2019 11:50    Catarina Hartshorn, DO  [facility] Pager [phone]  If 7PM-7AM, please contact night-coverage [email or web address] Password [email or web address] 10/28/2019, 5:03 PM   LOS: 21 days

## 2019-10-28 NOTE — Progress Notes (Signed)
Nutrition Follow-up     INTERVENTION:  Regular diet  Snack between meals as desired   NUTRITION DIAGNOSIS:   Increased nutrient needs related to acute illness(septic shock-endocarditis) as evidenced by estimated needs. Ongoing.  GOAL:  Patient will meet greater than or equal to 90% of their needs . Expected to be met.   MONITOR:  PO intake, Labs, Weight trends    REASON FOR ASSESSMENT:   LOS    ASSESSMENT:  Patient is a 33 yo with hx of IV drug abuse. Presents with septic shock-Strep Oralis/Mitis Endocarditis. Anemia (received 3 units PRBC's), CAP,  chronic back pain.  His appetite is excellent- po's mostly 100% of meals. Patient would like to gain weight if possible. Nutrition services is obtaining meal preferences for him to optimize intake. No changes to recommendations at this time. Nutritionally he is meeting est needs.  Weight history limited. It appears from chart review he has been weighing around 165-170 lb (77 kg). Weight has fluctuated during hospitalization.   Medications reviewed and include: Gentamycin, Penicillin,  Senokot, Nicoderm.  Labs: BMP Latest Ref Rng & Units 10/27/2019 10/21/2019 10/20/2019  Glucose 70 - 99 mg/dL 103(H) 104(H) 99  BUN 6 - 20 mg/dL _0 Creatinine 0.61 - 1.24 mg/dL 0.98 1.15 1.15  Sodium 135 - 145 mmol/L 136 136 133(L)  Potassium 3.5 - 5.1 mmol/L 4.1 4.3 4.2  Chloride 98 - 111 mmol/L 99 101 102  CO2 22 - 32 mmol/L 26 22 21(L)  Calcium 8.9 - 10.3 mg/dL 8.8(L) 9.3 8.9     Diet Order:   Diet Order            Diet regular Room service appropriate? Yes; Fluid consistency: Thin  Diet effective now              EDUCATION NEEDS:  No education needs have been identified at this time   Skin:  Skin Assessment: Reviewed RN Assessment  Last BM:  12/5  Height:   Ht Readings from Last 1 Encounters:  10/21/19 _1  (1.803 m)    Weight:   Current wt. 150.1 lb (68.1 kg) Wt Readings from Last 1 Encounters:   10/28/19 71.9 kg  Admission wt. 74.8 kg (165 lb)  Ideal Body Weight:  78 kg  BMI:  Body mass index is 22.11 kg/m.  Estimated Nutritional Needs:   Kcal:  2176-2380 (32-35 kcal/kg/bw)  Protein:  88-102 gr (1.3-1.5 gr/kg/bw)  Fluid:  >2 liters daily  Colman Cater MS,RD,CSG,LDN Office: [phone] Pager: [phone]

## 2019-10-28 NOTE — Plan of Care (Signed)
  Problem: Education: Goal: Knowledge of General Education information will improve Description: Including pain rating scale, medication(s)/side effects and non-pharmacologic comfort measures Outcome: Progressing   Problem: Health Behavior/Discharge Planning: Goal: Ability to manage health-related needs will improve Outcome: Progressing   Problem: Clinical Measurements: Goal: Ability to maintain clinical measurements within normal limits will improve Outcome: Progressing Goal: Will remain free from infection Outcome: Progressing Goal: Diagnostic test results will improve Outcome: Progressing Goal: Cardiovascular complication will be avoided Outcome: Progressing   Problem: Activity: Goal: Risk for activity intolerance will decrease Outcome: Progressing   Problem: Nutrition: Goal: Adequate nutrition will be maintained Outcome: Progressing   Problem: Coping: Goal: Level of anxiety will decrease Outcome: Progressing   Problem: Elimination: Goal: Will not experience complications related to bowel motility Outcome: Progressing Goal: Will not experience complications related to urinary retention Outcome: Progressing   Problem: Pain Managment: Goal: General experience of comfort will improve Outcome: Progressing   Problem: Safety: Goal: Ability to remain free from injury will improve Outcome: Progressing   Problem: Skin Integrity: Goal: Risk for impaired skin integrity will decrease Outcome: Progressing   Problem: Fluid Volume: Goal: Hemodynamic stability will improve Outcome: Progressing   Problem: Clinical Measurements: Goal: Diagnostic test results will improve Outcome: Progressing Goal: Signs and symptoms of infection will decrease Outcome: Progressing   Problem: Respiratory: Goal: Ability to maintain adequate ventilation will improve Outcome: Progressing   Problem: Fluid Volume: Goal: Hemodynamic stability will improve Outcome: Progressing   Problem:  Clinical Measurements: Goal: Diagnostic test results will improve Outcome: Progressing Goal: Signs and symptoms of infection will decrease Outcome: Progressing   Problem: Respiratory: Goal: Ability to maintain adequate ventilation will improve Outcome: Progressing   

## 2019-10-29 LAB — BASIC METABOLIC PANEL
Anion gap: 11 (ref 5–15)
BUN: 16 mg/dL (ref 6–20)
CO2: 25 mmol/L (ref 22–32)
Calcium: 9.1 mg/dL (ref 8.9–10.3)
Chloride: 100 mmol/L (ref 98–111)
Creatinine, Ser: 1.21 mg/dL (ref 0.61–1.24)
GFR calc Af Amer: >60 mL/min (ref >60)
GFR calc non Af Amer: >60 mL/min (ref >60)
Glucose, Bld: 101 mg/dL — ABNORMAL HIGH (ref 70–99)
Potassium: 4.3 mmol/L (ref 3.5–5.1)
Sodium: 136 mmol/L (ref 135–145)

## 2019-10-29 LAB — CBC
HCT: 27.5 % — ABNORMAL LOW (ref 39.0–52.0)
Hemoglobin: 7.9 g/dL — ABNORMAL LOW (ref 13.0–17.0)
MCH: 23.5 pg — ABNORMAL LOW (ref 26.0–34.0)
MCHC: 28.7 g/dL — ABNORMAL LOW (ref 30.0–36.0)
MCV: 81.8 fL (ref 80.0–100.0)
Platelets: 622 10*3/uL — ABNORMAL HIGH (ref 150–400)
RBC: 3.36 MIL/uL — ABNORMAL LOW (ref 4.22–5.81)
RDW: 21.6 % — ABNORMAL HIGH (ref 11.5–15.5)
WBC: 7 10*3/uL (ref 4.0–10.5)
nRBC: 0 % (ref 0.0–0.2)

## 2019-10-29 LAB — MAGNESIUM: Magnesium: 1.9 mg/dL (ref 1.7–2.4)

## 2019-10-29 MED ORDER — MAGNESIUM CITRATE PO SOLN
1.0000 | Freq: Once | ORAL | Status: AC
  Administered 2019-10-29: 1 via ORAL
  Filled 2019-10-29: qty 296

## 2019-10-29 NOTE — Progress Notes (Signed)
PROGRESS NOTE  Nathaniel Hansen WCH:[id number] DOB: 1985/12/31 DOA: 10/07/2019 PCP: Patient, No Pcp Per   Brief History: 33 year old male with history of IVDUand chronic back painadmitted to the hospital with septic shock due to Strep mitisBacteremia as well as severe anemia. Hemoglobin noted to be low at 5.7. He was transfused3units of PRBC during this hospitalization.Patient does have a previous history of IV drug use and reports last time he used drugs was approximately 1-1/2 months ago. He reports history of alcohol use, last time he drank was approximately 3 months ago.He initially required IV fluids and vasopressors.Weaned off of Levophed.  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 -blood cultures from 10/07/2019 with pansensitive strep  -source of bacteremia is presumed to be IVDU -Patient has history of intravenous drug use but had been on a suboxone treatment program for at least 1 month prior to admission  - MRI of C-spine and L-spine on 10/09/19 without acute findings, specifically no discitis or epidural infection -Initially received Rocephin and vancomycin (both stopped) -Repeat blood cultures 10/09/2019 and 10/13/2019 NGTD -Off vasopressors -cortisol normal, HIV antibody nonreactive -11/18-Echo EF 60-65%--Tricuspid vegetation measures 1.2 x 1.2 cm with moderate to severe tricuspid regurgitation -Discussed with cardiologist Dr. Wyline Mood who read the TTE, at this time no need for CT surgery consult, and no need for TEE - Discussed with Dr. Felipe Drone from ID on  10/09/2019-recommends IV Ancef or PCN GwithIV gent therapy for 2 weeks then repeat TTE, if tricuspid valve vegetation is less than 1 cm at that time may consider Ancef or PCN G monotherapy for additional 2 weeks for total of 4 weeks of therapy as long as blood cultures are negative and sepsis pathophysiology including fever and leukocytosis resolved.  -Vancomycin 11/16>>11/8 Ceftriaxone 11/16>>11/18 Azithromycin 11/16>>11/18 Cefazolin 11/18>>11/20 Penicillin G 11/20 >> Gentamicin 11/18>> for 2 weeks for synergy -12/3/--repeat echo demonstrating improved/resolved vegetation. -continue IV Pen G continuous infusion through 11/06/19  LobarCommunity-acquired pneumonia -TREATED and resolved. -Clinically, patient appears to be improved. Fevers are gone, he is not short of breath or coughing. Normal O2 sat on RA. -repeat chest x-ray on 10/12/2019 with bilateral pneumonia left more than right -WBC count is WNL --patient completed Levaquin for CAP on 11/21> 11/28. -repeat CXR in 6 weeks to assure infiltrates resolution.  acute on chronic symptomatic anemia/Anemia of Chronic Disease --Anemia panel indicates some degree of chronicdz 0patient has received transfusion of PRBC this admission (total 3 units PRBC). -Stool occult blood is negative -History of low MCV and low MCH with elevated RDW -B12--315 -folate 14.9 -iron saturation 3%, ferritin 248  Chronic back pain / opioid dependence -motivated not to return to injecting heroin. -He had reportedly been compliant with a suboxone treatment program for the month prior to admission and had not injected heroin during this time.Marland Kitchen He would like to be maintained on suboxone which is very reasonable. He has been restarted on suboxone with hopes of preventing relapse.  -MRI of the C-spine as well as lumbar spine without discitis or epidural abscess -Continue gabapentin, methocarbamol and as needed Tylenol, along with current dose of  Suboxone. Resume outpatient pain clinic management at discharge. -PMP Aware queried--no chronic controlled  substance prescriptions  Abd pain/Constipation -2 view AXR--large stool burden -give mag citrate x 1   Disposition Plan: Home 11/07/19 Family Communication:NoFamily at bedside  Consultants:none  Code Status: FULL   DVT Prophylaxis:[place] Lovenox   Procedures: As Listed in Progress Note Above  Antibiotics: As above   Disposition Plan: Home 11/07/19 Family Communication:NoFamily at bedside  Consultants:none  Code Status: FULL   DVT Prophylaxis: Lovenox   Procedures: As Listed in Progress Note Above  Antibiotics: As above   Subjective: Pt complains of LLQ abd pain, a little better than yesterday.  Patient denies fevers, chills, headache, chest pain, dyspnea, nausea, vomiting, diarrhea,  dysuria, hematuria, hematochezia, and melena.   Objective: Vitals:   10/28/19 0700 10/28/19 1426 10/29/19 0540 10/29/19 1536  BP: 131/66 103/62 100/67 114/71  Pulse: 83 98 82 95  Resp: 18 18 18 16   Temp: 98.3 F (36.8 C) 98.1 F (36.7 C) 98.6 F (37 C) 98.1 F (36.7 C)  TempSrc:   Oral Oral  SpO2: 99% 100% 98% 97%  Weight: 71.9 kg     Height:       No intake or output data in the 24 hours ending 10/29/19 1804 Weight change:  Exam:   General:  Pt is alert, follows commands appropriately, not in acute distress  HEENT: No icterus, No thrush, No neck mass, [place]/AT  Cardiovascular: RRR, S1/S2, no rubs, no gallops  Respiratory: CTA bilaterally, no wheezing, no crackles, no rhonchi  Abdomen: Soft/+BS, non tender, non distended, no guarding  Extremities: No edema, No lymphangitis, No petechiae, No rashes, no synovitis   Data Reviewed: I have personally reviewed following labs and imaging studies Basic Metabolic Panel: Recent Labs  Lab 10/27/19 0708 10/29/19 0516  NA 136 136  K 4.1 4.3  CL 99 100  CO2 26 25  GLUCOSE  103* 101*  BUN 15 16  CREATININE 0.98 1.21  CALCIUM 8.8* 9.1  MG 2.0 1.9   Liver Function Tests: Recent Labs  Lab 10/27/19 0708  AST 11*  ALT 16  ALKPHOS 92  BILITOT 0.1*  PROT 7.9  ALBUMIN 2.3*   No results for input(s): LIPASE, AMYLASE in the last 168 hours. No results for input(s): AMMONIA in the last 168 hours. Coagulation Profile: No results for input(s): INR, PROTIME in the last 168 hours. CBC: Recent Labs  Lab 10/23/19 0510 10/27/19 0708 10/29/19 0516  WBC 9.7 6.8 7.0  NEUTROABS 7.4  --   --   HGB 8.6* 8.0* 7.9*  HCT 28.8* 27.5* 27.5*  MCV 79.1* 81.1 81.8  PLT 738* 644* 622*   Cardiac Enzymes: No results for input(s): CKTOTAL, CKMB, CKMBINDEX, TROPONINI in the last 168 hours. BNP: Invalid input(s): POCBNP CBG: No results for input(s): GLUCAP in the last 168 hours. HbA1C: No results for input(s): HGBA1C in the last 72 hours. Urine analysis:    Component Value Date/Time   COLORURINE YELLOW 10/07/2019 1056   APPEARANCEUR CLEAR 10/07/2019 1056   LABSPEC 1.012 10/07/2019 1056   PHURINE 5.0 10/07/2019 1056   GLUCOSEU NEGATIVE 10/07/2019 1056   HGBUR NEGATIVE 10/07/2019 1056   BILIRUBINUR NEGATIVE 10/07/2019 1056   KETONESUR NEGATIVE 10/07/2019 1056   PROTEINUR NEGATIVE 10/07/2019 1056   NITRITE NEGATIVE 10/07/2019 1056   LEUKOCYTESUR NEGATIVE 10/07/2019 1056   Sepsis Labs: @LABRCNTIP (procalcitonin:4,lacticidven:4) )No results found for this or any previous visit (from the past 240 hour(s)).   Scheduled Meds:  buprenorphine-naloxone  1 tablet Sublingual BID   Chlorhexidine Gluconate Cloth  6 each Topical Daily  enoxaparin (LOVENOX) injection  40 mg Subcutaneous Q24H   gabapentin  800 mg Oral TID   hydrOXYzine  50 mg Oral TID   magnesium citrate  1 Bottle Oral Once   methocarbamol  1,000 mg Oral TID   nicotine  21 mg Transdermal Daily   senna-docusate  2 tablet Oral BID   traZODone  150 mg Oral QHS   Continuous Infusions:   penicillin g continuous IV infusion 9 Million Units (10/29/19 1107)    Procedures/Studies: Cxr  Result Date: 10/12/2019 CLINICAL DATA:  Dyspnea EXAM: CHEST - 2 VIEW COMPARISON:  Chest radiograph dated 10/07/2019 FINDINGS: The heart and mediastinum appear normal. There is moderate left basilar atelectasis/airspace disease. There is mild right basilar atelectasis/airspace disease. A trace left pleural effusion may contribute. There is no pneumothorax. The osseous structures are intact. IMPRESSION: Bibasilar atelectasis/airspace disease, greater on the left than the right. A trace left pleural effusion may contribute. Electronically Signed   By: Romona Curls M.D.   On: 10/12/2019 11:16   Mr Cervical Spine W Wo Contrast  Result Date: 10/09/2019 CLINICAL DATA:  Septic shock, neck pain EXAM: MRI CERVICAL SPINE WITHOUT AND WITH CONTRAST TECHNIQUE: Multiplanar and multiecho pulse sequences of the cervical spine, to include the craniocervical junction and cervicothoracic junction, were obtained without and with intravenous contrast. CONTRAST:  8mL GADAVIST GADOBUTROL 1 MMOL/ML IV SOLN COMPARISON:  None. FINDINGS: There is decreased signal and motion artifact. Findings below are within this limitation. Alignment: Anteroposterior alignment is maintained. Vertebrae: Vertebral body heights are maintained. No definite marrow edema or abnormal enhancement. Cord: No abnormal cord signal.  No epidural collection. Posterior Fossa, vertebral arteries, paraspinal tissues: Unremarkable. Disc levels: Small right paracentral protrusion at C6-C7. There is no significant canal or neural foraminal stenosis at any level. IMPRESSION: Suboptimal evaluation due to artifact. No evidence of discitis/osteomyelitis or epidural collection. Electronically Signed   By: Guadlupe Spanish M.D.   On: 10/09/2019 10:18   Mr Lumbar Spine W Wo Contrast  Result Date: 10/09/2019 CLINICAL DATA:  Bacteremia, back pain EXAM: MRI LUMBAR SPINE WITHOUT  AND WITH CONTRAST TECHNIQUE: Multiplanar and multiecho pulse sequences of the lumbar spine were obtained without and with intravenous contrast. CONTRAST:  8mL GADAVIST GADOBUTROL 1 MMOL/ML IV SOLN COMPARISON:  None. FINDINGS: Segmentation:  Standard. Alignment: Lumbar lordosis is preserved. Anteroposterior alignment is maintained. Vertebrae: Diffusely decreased T1 marrow signal likely reflecting hematopoietic marrow response to anemia. There is no marrow edema. Vertebral body heights are maintained. Conus medullaris and cauda equina: Conus extends to the L1-L2 level. Conus and cauda equina appear normal. Paraspinal and other soft tissues: Unremarkable. Disc levels: Disc desiccation and height loss L5-S1. Disc bulge with superimposed right central/subarticular protrusion with endplate osteophytic ridging. No canal stenosis. Narrowing the left lateral recess with potential compression of the traversing left S1 nerve root. Moderate foraminal stenosis. IMPRESSION: No evidence of osteomyelitis/discitis or epidural collection. Degenerative changes at L5-S1 as described. Electronically Signed   By: Guadlupe Spanish M.D.   On: 10/09/2019 10:27   Dg Chest Port 1 View  Result Date: 10/07/2019 CLINICAL DATA:  Central line placement. EXAM: PORTABLE CHEST 1 VIEW COMPARISON:  10/07/2019 at 11:40 a.m. FINDINGS: A new right jugular catheter terminates near the superior cavoatrial junction. There is persistent airspace opacity in the left lung base with associated volume loss, not significantly changed. No new airspace consolidation, edema, pleural effusion, or pneumothorax is identified. No acute osseous abnormality is seen. IMPRESSION: 1. New right jugular catheter terminating near the superior  cavoatrial junction. No pneumothorax. 2. Unchanged left basilar airspace opacity. Electronically Signed   By: Logan Bores M.D.   On: 10/07/2019 14:43   Dg Chest Port 1 View  Result Date: 10/07/2019 CLINICAL DATA:  Chest pain on the  left with cough EXAM: PORTABLE CHEST 1 VIEW COMPARISON:  September 01, 2019 FINDINGS: There is patchy airspace opacity consistent with pneumonia in the left base. The lungs elsewhere are clear. Heart size and pulmonary vascularity are within normal limits. No adenopathy. No bone lesions. IMPRESSION: Airspace opacity consistent with pneumonia left base. Lungs elsewhere clear. Cardiac silhouette within normal limits. No evident adenopathy. Electronically Signed   By: Lowella Grip III M.D.   On: 10/07/2019 11:50   Dg Abd 2 Views  Result Date: 10/28/2019 CLINICAL DATA:  Left mid to lower abdominal pain worse with inspiration EXAM: ABDOMEN - 2 VIEW COMPARISON:  CT abdomen pelvis 08/31/2019 FINDINGS: Stomach is distended with ingested material. No high-grade obstructive bowel gas pattern is seen. Large stool burden is noted throughout the colon. No evidence of free intraperitoneal air. Bandlike areas of atelectasis present in the lung bases. Osseous structures are unremarkable. No suspicious calcifications. IMPRESSION: 1. Stomach distended with ingested material. 2. No high-grade obstructive bowel gas pattern. 3. Large stool burden throughout the colon. Correlate for clinical features of slow transit/constipation. Electronically Signed   By: Lovena Le M.D.   On: 10/28/2019 21:49    Orson Eva, DO  [facility] Pager [phone]  If 7PM-7AM, please contact night-coverage [email or web address] Password [email or web address] 10/29/2019, 6:04 PM   LOS: 22 days

## 2019-10-30 DIAGNOSIS — G8929 Other chronic pain: Secondary | ICD-10-CM

## 2019-10-30 DIAGNOSIS — M549 Dorsalgia, unspecified: Secondary | ICD-10-CM

## 2019-10-30 DIAGNOSIS — A409 Streptococcal sepsis, unspecified: Principal | ICD-10-CM

## 2019-10-30 DIAGNOSIS — R509 Fever, unspecified: Secondary | ICD-10-CM

## 2019-10-30 DIAGNOSIS — R6520 Severe sepsis without septic shock: Secondary | ICD-10-CM

## 2019-10-30 DIAGNOSIS — F1120 Opioid dependence, uncomplicated: Secondary | ICD-10-CM

## 2019-10-30 DIAGNOSIS — I071 Rheumatic tricuspid insufficiency: Secondary | ICD-10-CM

## 2019-10-30 MED ORDER — METHOCARBAMOL 500 MG PO TABS: 500.0000 mg | ORAL_TABLET | Freq: Three times a day (TID) | ORAL | 0 refills | Status: DC | PRN

## 2019-10-30 MED ORDER — TRAZODONE HCL 150 MG PO TABS: 150.0000 mg | ORAL_TABLET | Freq: Every evening | ORAL | 0 refills | Status: DC | PRN

## 2019-10-30 MED ORDER — NICOTINE 21 MG/24HR TD PT24: 21.0000 mg | MEDICATED_PATCH | Freq: Every day | TRANSDERMAL | 0 refills | Status: DC

## 2019-10-30 MED ORDER — GABAPENTIN 400 MG PO CAPS: 800.0000 mg | ORAL_CAPSULE | Freq: Three times a day (TID) | ORAL | 0 refills | Status: DC

## 2019-10-30 MED ORDER — BUPRENORPHINE HCL-NALOXONE HCL 8-2 MG SL SUBL
1.0000 | SUBLINGUAL_TABLET | Freq: Once | SUBLINGUAL | Status: AC
  Administered 2019-10-30: 1 via SUBLINGUAL
  Filled 2019-10-30: qty 1

## 2019-10-30 MED ORDER — SENNOSIDES-DOCUSATE SODIUM 8.6-50 MG PO TABS: 1.0000 | ORAL_TABLET | Freq: Two times a day (BID) | ORAL | 0 refills | Status: AC

## 2019-10-30 MED ORDER — ORITAVANCIN DIPHOSPHATE 400 MG IV SOLR
1200.0000 mg | Freq: Once | INTRAVENOUS | Status: AC
  Administered 2019-10-30: 1200 mg via INTRAVENOUS
  Filled 2019-10-30: qty 120

## 2019-10-30 MED ORDER — HYDROXYZINE HCL 50 MG PO TABS: 50.0000 mg | ORAL_TABLET | Freq: Three times a day (TID) | ORAL | 0 refills | Status: DC | PRN

## 2019-10-30 MED ORDER — BUPRENORPHINE HCL-NALOXONE HCL 8-2 MG SL SUBL: 1.0000 | SUBLINGUAL_TABLET | Freq: Two times a day (BID) | SUBLINGUAL | 0 refills | Status: AC

## 2019-10-30 NOTE — TOC Transition Note (Addendum)
Transition of Care [facility]) - CM/SW Discharge Note   Patient Details  Name: Nathaniel Hansen MRN: [id number] Date of Birth: 01-02-1986  Transition of Care [facility]) CM/SW Contact:  Ihor Gully, LCSW Phone Number: 10/30/2019, 3:06 PM   Clinical Narrative:    Patient scheduled with [facility], Monday, 11/04/2019 @ 11:30 a.m. Patient provided [employer] voucher and scripts sent to [facility]. Patient advised that scripts would be $9/SWHQPR and about uncertainty of Suboxone being covered by [facility] voucher. Patient plans on returning to [facility] to reenroll in treatment. Spoke with his [facility] case Lawyer Williams who advised that she had spoken with patient on yesterday and advised that due to him being in the hospital he would have to complete his intake paperwork over again. He stated that patient's treatment is $20/day which covers RN case management and therapy.  Patient also provided two skat vouchers.  Patient can use [facility] for two transports per week for $4 per trip to [facility] with 72 business days notice. Patient provide the contact information and time frame requirements.  Call made to [facility] to assess services available to patient. No services identified in this area for patient are available.  TOC signing off.    Final next level of care: Home/Self Care Barriers to Discharge: Active Substance Use with PICC Line, Inadequate or no insurance   Patient Goals and CMS Choice Patient states their goals for this hospitalization and ongoing recovery are:: to go home.      Discharge Placement                       Discharge Plan and Services                                     Social Determinants of Health (SDOH) Interventions     Readmission Risk Interventions No flowsheet data found.

## 2019-10-30 NOTE — Progress Notes (Signed)
IV removed after completion of abx.  Discharge instructions reviewed, scripts sent to pharmacy, hard script for suboxone

## 2019-10-30 NOTE — Progress Notes (Signed)
[facility] Progress Note   Mr. Nathaniel Hansen is a 33 year old male with a history of substance abuse admitted with tricuspid valve endocarditis secondary to strep mitis bacteremia.   Mr. Nathaniel Hansen received 2 weeks of synergistic treatment with both gentamicin and ceftriaxone/penicillin as appropriate per his organism's sensitivity from 11/18 to 12/2 and has been on penicillin alone since 12/2.   Repeat ECHO on 12/3 showed resolution of the large tricuspid valve septal leaflet vegetation that was observed on his original ECHO on 11/18.  Mr. Nathaniel Hansen has received adequate treatment for his strep mitis endocarditis and was originally slated to receive an additional 2 weeks of antibiotics through 12/16. I discussed this patient with  Dr. Prince Rome this morning and we are in agreement that he is an appropriate candidate for a 1 time dose of oritavancin 1200 mg to complete his therapy and to allow discharge from the hospital.    Plan Oritavancin 1200 mg X 1 today No further antibiotic therapy needed post infusion of oritavancin We will obtain patient assistance for his dose of oritavancin so the patient will receive at no charge   Jimmy Footman, PharmD, BCPS, BCIDP Infectious Diseases Clinical Pharmacist Phone: [phone] 10/30/2019 10:16 AM
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
## 2019-11-06 ENCOUNTER — Other Ambulatory Visit: Payer: Self-pay

## 2019-11-06 ENCOUNTER — Emergency Department (HOSPITAL_COMMUNITY)
Admission: EM | Admit: 2019-11-06 | Discharge: 2019-11-08 | Disposition: A | Discharge status: Other Acute Inpatient Hospital | Payer: Self-pay | Attending: Emergency Medicine | Admitting: Emergency Medicine

## 2019-11-06 DIAGNOSIS — Z79899 Other long term (current) drug therapy: Secondary | ICD-10-CM | POA: Insufficient documentation

## 2019-11-06 DIAGNOSIS — F17210 Nicotine dependence, cigarettes, uncomplicated: Secondary | ICD-10-CM | POA: Insufficient documentation

## 2019-11-06 DIAGNOSIS — R443 Hallucinations, unspecified: Secondary | ICD-10-CM

## 2019-11-06 DIAGNOSIS — Z20828 Contact with and (suspected) exposure to other viral communicable diseases: Secondary | ICD-10-CM | POA: Insufficient documentation

## 2019-11-06 DIAGNOSIS — F329 Major depressive disorder, single episode, unspecified: Secondary | ICD-10-CM

## 2019-11-06 DIAGNOSIS — R7989 Other specified abnormal findings of blood chemistry: Secondary | ICD-10-CM

## 2019-11-06 DIAGNOSIS — J45909 Unspecified asthma, uncomplicated: Secondary | ICD-10-CM | POA: Insufficient documentation

## 2019-11-06 DIAGNOSIS — F1910 Other psychoactive substance abuse, uncomplicated: Secondary | ICD-10-CM

## 2019-11-06 DIAGNOSIS — IMO0002 Reserved for concepts with insufficient information to code with codable children: Secondary | ICD-10-CM

## 2019-11-06 DIAGNOSIS — D649 Anemia, unspecified: Secondary | ICD-10-CM

## 2019-11-06 DIAGNOSIS — F1120 Opioid dependence, uncomplicated: Secondary | ICD-10-CM | POA: Diagnosis present

## 2019-11-06 LAB — CBC WITH DIFFERENTIAL/PLATELET
Abs Immature Granulocytes: 0.02 10*3/uL (ref 0.00–0.07)
Basophils Absolute: 0.1 10*3/uL (ref 0.0–0.1)
Basophils Relative: 1 %
Eosinophils Absolute: 0.1 10*3/uL (ref 0.0–0.5)
Eosinophils Relative: 1 %
HCT: 35 % — ABNORMAL LOW (ref 39.0–52.0)
Hemoglobin: 9.8 g/dL — ABNORMAL LOW (ref 13.0–17.0)
Immature Granulocytes: 0 %
Lymphocytes Relative: 13 %
Lymphs Abs: 1.4 10*3/uL (ref 0.7–4.0)
MCH: 23.2 pg — ABNORMAL LOW (ref 26.0–34.0)
MCHC: 28 g/dL — ABNORMAL LOW (ref 30.0–36.0)
MCV: 82.9 fL (ref 80.0–100.0)
Monocytes Absolute: 0.1 10*3/uL (ref 0.1–1.0)
Monocytes Relative: 1 %
Neutro Abs: 8.9 10*3/uL — ABNORMAL HIGH (ref 1.7–7.7)
Neutrophils Relative %: 84 %
Platelets: 381 10*3/uL (ref 150–400)
RBC: 4.22 MIL/uL (ref 4.22–5.81)
RDW: 21.5 % — ABNORMAL HIGH (ref 11.5–15.5)
WBC: 10.5 10*3/uL (ref 4.0–10.5)
nRBC: 0 % (ref 0.0–0.2)

## 2019-11-06 LAB — COMPREHENSIVE METABOLIC PANEL
ALT: 15 U/L (ref 0–44)
AST: 18 U/L (ref 15–41)
Albumin: 3.5 g/dL (ref 3.5–5.0)
Alkaline Phosphatase: 91 U/L (ref 38–126)
Anion gap: 10 (ref 5–15)
BUN: 30 mg/dL — ABNORMAL HIGH (ref 6–20)
CO2: 27 mmol/L (ref 22–32)
Calcium: 9.7 mg/dL (ref 8.9–10.3)
Chloride: 99 mmol/L (ref 98–111)
Creatinine, Ser: 2.31 mg/dL — ABNORMAL HIGH (ref 0.61–1.24)
GFR calc Af Amer: 42 mL/min — ABNORMAL LOW (ref >60)
GFR calc non Af Amer: 36 mL/min — ABNORMAL LOW (ref >60)
Glucose, Bld: 80 mg/dL (ref 70–99)
Potassium: 4 mmol/L (ref 3.5–5.1)
Sodium: 136 mmol/L (ref 135–145)
Total Bilirubin: 0.7 mg/dL (ref 0.3–1.2)
Total Protein: 10.3 g/dL — ABNORMAL HIGH (ref 6.5–8.1)

## 2019-11-06 LAB — ETHANOL: Alcohol, Ethyl (B): <10 mg/dL (ref <10)

## 2019-11-06 LAB — RESPIRATORY PANEL BY RT PCR (FLU A&B, COVID)
Influenza A by PCR: NEGATIVE
Influenza B by PCR: NEGATIVE
SARS Coronavirus 2 by RT PCR: NEGATIVE

## 2019-11-06 LAB — RAPID URINE DRUG SCREEN, HOSP PERFORMED
Amphetamines: NOT DETECTED
Barbiturates: NOT DETECTED
Benzodiazepines: NOT DETECTED
Cocaine: NOT DETECTED
Opiates: NOT DETECTED
Tetrahydrocannabinol: NOT DETECTED

## 2019-11-06 MED ORDER — NICOTINE 21 MG/24HR TD PT24
21.0000 mg | MEDICATED_PATCH | Freq: Every day | TRANSDERMAL | Status: DC
  Administered 2019-11-06 – 2019-11-07 (×2): 21 mg via TRANSDERMAL
  Filled 2019-11-06 (×2): qty 1

## 2019-11-06 MED ORDER — BUPRENORPHINE HCL-NALOXONE HCL 8-2 MG SL SUBL
1.0000 | SUBLINGUAL_TABLET | Freq: Two times a day (BID) | SUBLINGUAL | Status: DC
  Administered 2019-11-06 – 2019-11-08 (×4): 1 via SUBLINGUAL
  Filled 2019-11-06 (×4): qty 1

## 2019-11-06 MED ORDER — ZOLPIDEM TARTRATE 5 MG PO TABS: 5.0000 mg | ORAL_TABLET | Freq: Every evening | ORAL | Status: DC | PRN

## 2019-11-06 MED ORDER — ACETAMINOPHEN 325 MG PO TABS: 650.0000 mg | ORAL_TABLET | ORAL | Status: DC | PRN

## 2019-11-06 MED ORDER — SODIUM CHLORIDE 0.9 % IV BOLUS
1000.0000 mL | Freq: Once | INTRAVENOUS | Status: AC
  Administered 2019-11-07: 1000 mL via INTRAVENOUS

## 2019-11-06 MED ORDER — ALUM & MAG HYDROXIDE-SIMETH 200-200-20 MG/5ML PO SUSP: 30.0000 mL | Freq: Four times a day (QID) | ORAL | Status: DC | PRN

## 2019-11-06 MED ORDER — ONDANSETRON HCL 4 MG PO TABS: 4.0000 mg | ORAL_TABLET | Freq: Three times a day (TID) | ORAL | Status: DC | PRN

## 2019-11-06 NOTE — ED Provider Notes (Signed)
[facility]-EMERGENCY DEPT Provider Note   CSN: [id number] Arrival date & time: 11/06/19  1929     History Chief Complaint  Patient presents with  . Mental Health Problem    Nathaniel Hansen is a 33 y.o. male.  The history is provided by the patient and medical records. No language interpreter was used.  Mental Health Problem      33 year old male with history of opioid abuse presented to ED requesting for psychiatric evaluation.  Patient report lately for the past few weeks he has been feeling increasingly depressed.  He started to hear voices as well as seeing things which is new.  He has some vague suicidal ideation last night without any HI.  He report he is eating and sleeping habit has been altered.  He felt he needed mental health help as he is feeling unsafe.  He denies any fever chills.  He was previously admitted to the hospital for sepsis due to pneumonia and IV drug use but states he is better from that standpoint.  He denies any recent drug use or alcohol use.  He denies being on any antipsychiatric medication.  He mention his auditory and visual hallucinations new.  He does not report any command hallucination.   Past Medical History:  Diagnosis Date  . Asthma   . Opioid abuse [facility])     Patient Active Problem List   Diagnosis Date Noted  . Streptococcal sepsis, unspecified (HCC) 10/27/2019  . Opioid dependence (HCC) 10/23/2019  . Encounter for monitoring Suboxone maintenance therapy 10/23/2019  . Leukocytosis 10/23/2019  . Fever, unspecified 10/23/2019  . 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 Streptococcal Mitis/Oralis Bacteremia 10/08/2019  . Septic shock (HCC) 10/07/2019  . CAP (community acquired pneumonia) 10/07/2019  . Anemia 10/07/2019    . Lactic acidosis 10/07/2019  . Chronic back pain 10/07/2019    No past surgical history on file.     No family history on file.  Social History   Tobacco Use  . Smoking status: Current Every Day Smoker    Packs/day: 1.00    Types: Cigarettes  . Smokeless tobacco: Never Used  Substance Use Topics  . Alcohol use: Yes    Comment: stopped alcohol about 3 month ago   . Drug use: Yes    Types: Marijuana, Heroin    Comment: "sometimes" opioids, heroin use about a month and a half ago     Home Medications Prior to Admission medications   Medication Sig Start Date End Date Taking? Authorizing Provider  buprenorphine-naloxone (SUBOXONE) 8-2 mg SUBL SL tablet Place 1 tablet under the tongue 2 (two) times daily. 10/30/19 11/29/19  Johnson, Clanford L, MD  gabapentin (NEURONTIN) 400 MG capsule Take 2 capsules (800 mg total) by mouth 3 (three) times daily. 10/30/19 11/29/19  Cleora Fleet, MD  hydrOXYzine (ATARAX/VISTARIL) 50 MG tablet Take 1 tablet (50 mg total) by mouth every 8 (eight) hours as needed for itching. 10/30/19   Johnson, Clanford L, MD  methocarbamol (ROBAXIN) 500 MG tablet Take 1 tablet (500 mg total) by mouth every 8 (eight) hours as needed for muscle spasms. 10/30/19   Johnson, Clanford L, MD  nicotine (NICODERM CQ - DOSED IN MG/24 HOURS) 21 mg/24hr patch Place 1 patch (21 mg total) onto the skin daily. 10/31/19   Cleora Fleet, MD  senna-docusate (SENOKOT-S) 8.6-50 MG tablet Take 1 tablet by mouth 2 (two) times daily. 10/30/19 11/29/19  Cleora FleetJohnson, Clanford L, MD  traZODone (DESYREL) 150 MG tablet Take 1 tablet (150 mg total) by mouth at bedtime as needed for sleep. 10/30/19 11/29/19  Cleora FleetJohnson, Clanford L, MD    Allergies    Patient has no known allergies.  Review of Systems   Review of Systems  All other systems reviewed and are negative.   Physical Exam Updated Vital Signs BP 140/79 (BP Location: Right Arm)   Pulse (!) 118   Temp 99.2 F (37.3 C) (Oral)   Resp  20   Ht 5\' 11"  (1.803 m)   Wt 74.5 kg   SpO2 99%   BMI 22.91 kg/m   Physical Exam Vitals and nursing note reviewed.  Constitutional:      General: He is not in acute distress.    Appearance: He is well-developed.  HENT:     Head: Atraumatic.  Eyes:     Conjunctiva/sclera: Conjunctivae normal.  Cardiovascular:     Rate and Rhythm: Tachycardia present.     Pulses: Normal pulses.     Heart sounds: Normal heart sounds.  Pulmonary:     Effort: Pulmonary effort is normal.     Breath sounds: Normal breath sounds.  Abdominal:     Palpations: Abdomen is soft.     Tenderness: There is no abdominal tenderness.  Musculoskeletal:     Cervical back: Neck supple.  Skin:    Findings: No rash.  Neurological:     Mental Status: He is alert.     GCS: GCS eye subscore is 4. GCS verbal subscore is 5. GCS motor subscore is 6.  Psychiatric:        Mood and Affect: Mood normal.        Speech: Speech normal.        Behavior: Behavior is cooperative.        Thought Content: Thought content does not include homicidal or suicidal ideation.     ED Results / Procedures / Treatments   Labs (all labs ordered are listed, but only abnormal results are displayed) Labs Reviewed  RESPIRATORY PANEL BY RT PCR (FLU A&B, COVID)  COMPREHENSIVE METABOLIC PANEL  ETHANOL  CBC WITH DIFFERENTIAL/PLATELET  RAPID URINE DRUG SCREEN, HOSP PERFORMED    EKG None  Radiology No results found.  Procedures Procedures (including critical care time)  Medications Ordered in ED Medications  acetaminophen (TYLENOL) tablet 650 mg (has no administration in time range)  zolpidem (AMBIEN) tablet 5 mg (has no administration in time range)  ondansetron (ZOFRAN) tablet 4 mg (has no administration in time range)  alum & mag hydroxide-simeth (MAALOX/MYLANTA) 200-200-20 MG/5ML suspension 30 mL (has no administration in time range)  nicotine (NICODERM CQ - dosed in mg/24 hours) patch 21 mg (has no administration in time  range)  buprenorphine-naloxone (SUBOXONE) 8-2 mg per SL tablet 1 tablet (has no administration in time range)    ED Course  I have reviewed the triage vital signs and the nursing notes.  Pertinent labs & imaging results that were available during my care of the patient were reviewed by me and considered in my medical decision making (see chart for details).    MDM Rules/Calculators/A&P                      BP 140/79 (BP Location: Right Arm)   Pulse (!) 118   Temp 99.2 F (37.3 C) (Oral)  Resp 20   Ht 5\' 11"  (1.803 m)   Wt 74.5 kg   SpO2 99%   BMI 22.91 kg/m   Final Clinical Impression(s) / ED Diagnoses Final diagnoses:  Hallucination  Major depressive disorder, remission status unspecified, unspecified whether recurrent    Rx / DC Orders ED Discharge Orders    None     8:10 PM Patient report auditory and visual hallucination with increased depression within the past few weeks and request for psychiatric assessment.  Report vague SI without plan.  Previously admitted to the hospital due to sepsis secondary to IV drug use and pneumonia.  He report feeling better from that standpoint.  He is however mildly tachycardic but lungs clear on exam.  Will perform medical screening exam and will consult TTS for further management.  9:30 PM Pt sign out to oncoming provider who will f/u on labs and determine medical clearance.     Domenic Moras, PA-C 11/06/19 2130    Dorie Rank, MD 11/07/19 [phone]

## 2019-11-06 NOTE — [facility] Counselor (Signed)
Clinician asked the pt if he has any supports. Pt listed his mother and sister. Pt did not respond when asked if she (clinician) could call family supports to obtain collateral information.    Vertell Novak, [place], [facility] [place], [facility] Triage Specialist [phone]

## 2019-11-06 NOTE — ED Provider Notes (Signed)
Received patient as a handoff at shift change by Domenic Moras, PA-C.  HPI below provided by Haywood Pao. 33 year old male with history of opioid abuse presented to ED requesting for psychiatric evaluation.  Patient report lately for the past few weeks he has been feeling increasingly depressed.  He started to hear voices as well as seeing things which is new.  He has some vague suicidal ideation last night without any HI.  He report he is eating and sleeping habit has been altered.  He felt he needed mental health help as he is feeling unsafe.  He denies any fever chills.  He was previously admitted to the hospital for sepsis due to pneumonia and IV drug use but states he is better from that standpoint.  He denies any recent drug use or alcohol use.  He denies being on any antipsychiatric medication.  He mention his auditory and visual hallucinations new.  He does not report any command hallucination.   Physical Exam  BP 140/79 (BP Location: Right Arm)   Pulse (!) 118   Temp 99.2 F (37.3 C) (Oral)   Resp 20   Ht 5\' 11"  (1.803 m)   Wt 74.5 kg   SpO2 99%   BMI 22.91 kg/m   Physical Exam Vitals and nursing note reviewed. Exam conducted with a chaperone present.  Constitutional:      Appearance: Normal appearance.  HENT:     Head: Normocephalic and atraumatic.  Eyes:     General: No scleral icterus.    Extraocular Movements: Extraocular movements intact.     Conjunctiva/sclera: Conjunctivae normal.     Pupils: Pupils are equal, round, and reactive to light.  Cardiovascular:     Rate and Rhythm: Regular rhythm. Tachycardia present.     Pulses: Normal pulses.     Heart sounds: Normal heart sounds.  Pulmonary:     Effort: Pulmonary effort is normal.     Breath sounds: Normal breath sounds.  Abdominal:     General: Abdomen is flat. There is no distension.     Palpations: Abdomen is soft.     Tenderness: There is no abdominal tenderness. There is no guarding.  Musculoskeletal:   Cervical back: Normal range of motion and neck supple. No rigidity.  Skin:    General: Skin is dry.     Capillary Refill: Capillary refill takes less than 2 seconds.  Neurological:     General: No focal deficit present.     Mental Status: He is alert and oriented to person, place, and time.     GCS: GCS eye subscore is 4. GCS verbal subscore is 5. GCS motor subscore is 6.  Psychiatric:        Mood and Affect: Mood normal.        Thought Content: Thought content normal.     Comments: Pleasant, but fatigued.       ED Course/Procedures     Procedures N/A  MDM   Patient endorses auditory visual hallucinations with increased depression and questionable SI and is requesting psychiatric assessment.  Labs still pending at time of handoff.  On my assessment, patient endorses increased depression and intermittent hallucinations, but otherwise is pleasant.  I woke him from his sleep for my evaluation, so he appeared fatigued.  However denied any medical complaints at this time and feels incredibly improved from his recent admission for sepsis due to pneumonia.    CBC demonstrates mild anemia to 9.8 hemoglobin, but that is above patient's baseline and  he denies any lightheadedness symptoms.  White count is borderline leukocytosis, but still clinically within normal months.  Respiratory panel was all negative.  CMP demonstrated no electrolyte abnormalities, but patient had elevated creatinine and BUN as well as decreased GFR concerning for AKI.  Suspect possible dehydration and prerenal azotemia, which would explain patient's tachycardia. Discussed with Dr. Lynelle Doctor and will provide patient with 1 L normal saline fluid resuscitation and reassess renal function in the morning. Patient is otherwise medically cleared.  TTS to evaluate patient and determine disposition.        Lorelee New, PA-C 11/06/19 2347    Raeford Razor, MD 11/07/19 [phone]

## 2019-11-06 NOTE — ED Triage Notes (Addendum)
Pt presents to the ED requesting a mental health evaluation and a follow up check up on where he was in the hospital for endocarditis. Requesting a TTS consult because of a lot of anger he has been having towards others and thoughts of hurting them. Denies SI, but endorses auditory hallucinations.

## 2019-11-06 NOTE — BH Assessment (Signed)
Tele Assessment Note   Patient Name: Nathaniel Hansen MRN: [id number] Referring Physician: Domenic Moras, PA-C. Location of Patient: [facility], [phone].  Location of Provider: [place] is an 33 y.o. male, who presents voluntary and unaccompanied to [facility]. During the assessment pt was a poor historian, clinician redirected the pt numerous times to engage however he continued sleeping. Clinician asked the pt, "what brought you to the hospital?" Clinician asked the previous question numerous times before the pt responded. Pt reported, "mental health issues." Clinician asked the pt to described his mental health issues. Pt reported, for the past couple months having rants, wanting to fight someone. Pt reported, hearing voices telling him to hurt others, if he doesn't hurt others, to hurt himself. Pt reported, he's been hearing voices for seventeen years. Pt reported, today, seeing shadows. Pt reported, he was suicidal at 2am with no plan. Initially, pt reported, having access to a taser then pt denied having access to weapons.   Clinician was unable to assess: if pt is linked to OPT resources, symptoms of depression, vegetative symptoms, self-injurious behaviors, family history of suicide, impulse control. Pt's UDS is negative.   Pt presents sleeping in scrubs slurred speech (pt also would make random statement when he would briefly wake up during the assessment). Pt's eye contact was poor. Pt's mood was pleasant. Pt's affect was flat. Pt's thought process was circumstantial. Pt's judgement was impaired. Pt was oriented x4. Pt's concentration, and insight was poor. Pt reported, if discharged from [facility] he could not contract for safety, he would harm himself or others. Pt said he was not SI however said he would hurt himself discharged.   Diagnosis: Unspecified schizophrenia spectrum and other psychotic disorder.  Past Medical History:  Past Medical History:   Diagnosis Date  . Asthma   . Opioid abuse ([place])     No past surgical history on file.  Family History: No family history on file.  Social History:  reports that he has been smoking cigarettes. He has been smoking about 1.00 pack per day. He has never used smokeless tobacco. He reports current alcohol use. He reports current drug use. Drugs: Marijuana and Heroin.  Additional Social History:  Alcohol / Drug Use Pain Medications: See MAR Prescriptions: See MAR Over the Counter: See MAR History of alcohol / drug use?: No history of alcohol / drug abuse(Pt's UDS is negative.)  CIWA: CIWA-Ar BP: 140/79 Pulse Rate: (!) 118 COWS:    Allergies: No Known Allergies  Home Medications: (Not in a hospital admission)   OB/GYN Status:  No LMP for male patient.  General Assessment Data Location of Assessment: [facility] ED TTS Assessment: In system Is this a Tele or Face-to-Face Assessment?: Tele Assessment Is this an Initial Assessment or a Re-assessment for this encounter?: Initial Assessment Patient Accompanied by:: N/A Language Other than English: No Living Arrangements: Other (Comment)(Sister on the weekends. ) What gender do you identify as?: Male Marital status: Single Living Arrangements: Other relatives Can pt return to current living arrangement?: (UTA) Admission Status: Voluntary Is patient capable of signing voluntary admission?: Yes Referral Source: Self/Family/Friend Insurance type: Self-pay.      Crisis Care Plan Living Arrangements: Other relatives Legal Guardian: Other:(Self. ) Name of Psychiatrist: [place] Name of Therapist: UTA  Education Status Is patient currently in school?: No Is the patient employed, unemployed or receiving disability?: Unemployed  Risk to self with the past 6 months Suicidal Ideation: No-Not Currently/Within Last 6  Months Has patient been a risk to self within the past 6 months prior to admission? : Yes Suicidal Intent: No Has patient had  any suicidal intent within the past 6 months prior to admission? : No Is patient at risk for suicide?: Yes Suicidal Plan?: No(Pt denies. ) Has patient had any suicidal plan within the past 6 months prior to admission? : (UTA) Access to Means: No(Pt denies. ) What has been your use of drugs/alcohol within the last 12 months?: Negative.  Previous Attempts/Gestures: No How many times?: 0 Other Self Harm Risks: NA Triggers for Past Attempts: None known Intentional Self Injurious Behavior: (UTA) Family Suicide History: Unable to assess Recent stressful life event(s): Other (Comment)(Pt denies, stressors. ) Persecutory voices/beliefs?: Yes Depression: (UTA) Depression Symptoms: (UTA) Substance abuse history and/or treatment for substance abuse?: (UTRA) Suicide prevention information given to non-admitted patients: Not applicable  Risk to Others within the past 6 months Homicidal Ideation: Yes-Currently Present Does patient have any lifetime risk of violence toward others beyond the six months prior to admission? : No(Pt denies. ) Thoughts of Harm to Others: Yes-Currently Present Comment - Thoughts of Harm to Others: Pt reported, wanting to fight others.  Current Homicidal Intent: Yes-Currently Present Current Homicidal Plan: Yes-Currently Present Describe Current Homicidal Plan: Pt reported, wanting to fight others.  Access to Homicidal Means: No(Pt denies. ) Identified Victim: UTA History of harm to others?: No(Pt denies. ) Assessment of Violence: None Noted Violent Behavior Description: NA Does patient have access to weapons?: No(Pt denies. ) Criminal Charges Pending?: (UTA) Does patient have a court date: (UTA) Is patient on probation?: (UTA)  Psychosis Hallucinations: Auditory, Visual Delusions: None noted  Mental Status Report Appearance/Hygiene: In scrubs Eye Contact: Poor Motor Activity: Unremarkable Speech: Slurred(due to pt sleeping during the assessment. ) Level of  Consciousness: Sleeping Mood: Pleasant Affect: Flat Anxiety Level: None Thought Processes: Circumstantial Judgement: Impaired Orientation: Person, Place, Time, Situation Obsessive Compulsive Thoughts/Behaviors: None  Cognitive Functioning Concentration: Poor Memory: Recent Impaired Is patient IDD: No Insight: Poor Impulse Control: Unable to Assess Appetite: Fair Have you had any weight changes? : No Change Sleep: (UTA)  ADLScreening [facility] Assessment Services) Patient's cognitive ability adequate to safely complete daily activities?: Yes Patient able to express need for assistance with ADLs?: Yes Independently performs ADLs?: Yes (appropriate for developmental age)  Prior Inpatient Therapy Prior Inpatient Therapy: Yes Prior Therapy Dates: UTA Prior Therapy Facilty/Provider(s): A facililty in [place], [place]. Reason for Treatment: UTA  Prior Outpatient Therapy Prior Outpatient Therapy: (UTA)  ADL Screening (condition at time of admission) Patient's cognitive ability adequate to safely complete daily activities?: Yes Does the patient have difficulty seeing, even when wearing glasses/contacts?: No Does the patient have difficulty concentrating, remembering, or making decisions?: Yes Patient able to express need for assistance with ADLs?: Yes Does the patient have difficulty dressing or bathing?: No Independently performs ADLs?: Yes (appropriate for developmental age) Does the patient have difficulty walking or climbing stairs?: No Weakness of Legs: None Weakness of Arms/Hands: None(Pt reported, back pain.)  Home Assistive Devices/Equipment Home Assistive Devices/Equipment: None    Abuse/Neglect Assessment (Assessment to be complete while patient is alone) Abuse/Neglect Assessment Can Be Completed: Yes Physical Abuse: Denies Verbal Abuse: Denies Sexual Abuse: Denies Exploitation of patient/patient's resources: Denies Self-Neglect: Denies     Merchant navy officer (For  Healthcare) Does Patient Have a Medical Advance Directive?: No          Disposition: Adaku Anike, NP recommend pt to be reassessed by psychiatry. Disposition  discussed with Rob, PA.    Disposition Initial Assessment Completed for this Encounter: Yes  This service was provided via telemedicine using a 2-way, interactive audio and video technology.  Names of all persons participating in this telemedicine service and their role in this encounter. Name: Shella SpearingStedman L Markus Role: Patient.   Name: Redmond Pullingreylese D Sailor Hevia, MS, [facility], [facility]. Role: Counselor.           Redmond Pullingreylese D Aireona Torelli 11/06/2019 10:56 PM    Redmond Pullingreylese D Raiven Belizaire, MS, [facility], [facility] Triage Specialist [phone]

## 2019-11-07 ENCOUNTER — Observation Stay (HOSPITAL_COMMUNITY)
Admission: AD | Admit: 2019-11-07 | Payer: No Typology Code available for payment source | Source: Intra-hospital | Admitting: Psychiatry

## 2019-11-07 LAB — COMPREHENSIVE METABOLIC PANEL
ALT: 47 U/L — ABNORMAL HIGH (ref 0–44)
AST: 76 U/L — ABNORMAL HIGH (ref 15–41)
Albumin: 2.7 g/dL — ABNORMAL LOW (ref 3.5–5.0)
Alkaline Phosphatase: 78 U/L (ref 38–126)
Anion gap: 8 (ref 5–15)
BUN: 32 mg/dL — ABNORMAL HIGH (ref 6–20)
CO2: 28 mmol/L (ref 22–32)
Calcium: 9.2 mg/dL (ref 8.9–10.3)
Chloride: 100 mmol/L (ref 98–111)
Creatinine, Ser: 2.28 mg/dL — ABNORMAL HIGH (ref 0.61–1.24)
GFR calc Af Amer: 42 mL/min — ABNORMAL LOW (ref >60)
GFR calc non Af Amer: 37 mL/min — ABNORMAL LOW (ref >60)
Glucose, Bld: 96 mg/dL (ref 70–99)
Potassium: 4.3 mmol/L (ref 3.5–5.1)
Sodium: 136 mmol/L (ref 135–145)
Total Bilirubin: 0.3 mg/dL (ref 0.3–1.2)
Total Protein: 8.3 g/dL — ABNORMAL HIGH (ref 6.5–8.1)

## 2019-11-07 NOTE — ED Notes (Signed)
  Patient arrived at [facility], staff dissatisfied with labs, sent patient back to ED. [facility] requesting repeat creatinine and EKG due to patient saying his chest hurts.

## 2019-11-07 NOTE — ED Notes (Signed)
Patient stated to tech his "heart didn't feel right". Notified MD of patient concerns.

## 2019-11-07 NOTE — ED Notes (Signed)
Report given to [place] at [facility]

## 2019-11-07 NOTE — Progress Notes (Signed)
Received Nathaniel Hansen at the change of shift awake in his room with the sitter at the bedside. He denied hearing voices at the present time. He was given a snack of his choice. He is calm and cooperative at the present time. He slept throughout the night without incident.

## 2019-11-07 NOTE — ED Notes (Signed)
Patient eating lunch tray at bedside. No distress noted at this time.

## 2019-11-07 NOTE — Progress Notes (Signed)
Pt was brought over for admission from obs unit.  Dr. Mallie Darting requested that he be sent back to [facility] because he needs to see his Creatine level trending down before he can be accepted to [facility].  Called [facility] charge nurse to inform her that he will be returning.

## 2019-11-07 NOTE — BH Assessment (Signed)
[place] Assessment Progress Note  Per Letitia Libra, FNP, this pt would benefit from admission to the [facility] Observation Unit at this time.  She reports that pt has been assigned pt to Obs Rm 402-1.  Pt's nurse, Melody, is aware of this.  She has had pt sign Voluntary Admission and Consent for Treatment, and has faxed it to [facility].  She agrees to send original paperwork along with pt via [facility], and to call report to [phone].  Jalene Mullet, [place] Coordinator [phone]

## 2019-11-07 NOTE — ED Notes (Signed)
Safe transport contacted, notified need for transport to [facility].

## 2019-11-07 NOTE — ED Provider Notes (Addendum)
1115: Labs reviewed.  Creatinine noted to be 2.31 and Hgb 9.8.  Discussed with patient.  He is not aware of any kidney issues in the past.  This will need to be followed via a repeat creatinine in 24 hours.  Nephrology consult at some point non urgently unless creatinine is aggressively elevating.  Patient also complains of chest pain.  Will obtain EKG.   1230:  EKG: NSR.  Neg acute.   Stable for transfer to [facility]. Nat Christen, MD 11/07/19 1129    Nat Christen, MD 11/07/19 [phone]

## 2019-11-07 NOTE — ED Notes (Signed)
Writer obtained consent for [facility], faxed over to [facility]  

## 2019-11-08 DIAGNOSIS — F1910 Other psychoactive substance abuse, uncomplicated: Secondary | ICD-10-CM

## 2019-11-08 LAB — BASIC METABOLIC PANEL
Anion gap: 9 (ref 5–15)
BUN: 35 mg/dL — ABNORMAL HIGH (ref 6–20)
CO2: 30 mmol/L (ref 22–32)
Calcium: 9.5 mg/dL (ref 8.9–10.3)
Chloride: 98 mmol/L (ref 98–111)
Creatinine, Ser: 1.9 mg/dL — ABNORMAL HIGH (ref 0.61–1.24)
GFR calc Af Amer: 53 mL/min — ABNORMAL LOW (ref >60)
GFR calc non Af Amer: 46 mL/min — ABNORMAL LOW (ref >60)
Glucose, Bld: 86 mg/dL (ref 70–99)
Potassium: 4.4 mmol/L (ref 3.5–5.1)
Sodium: 137 mmol/L (ref 135–145)

## 2019-11-08 MED ORDER — SODIUM CHLORIDE 0.9 % IV BOLUS
2000.0000 mL | Freq: Once | INTRAVENOUS | Status: AC
  Administered 2019-11-08: 2000 mL via INTRAVENOUS

## 2019-11-08 NOTE — Consult Note (Signed)
[facility] Psych ED Discharge  11/08/2019 10:49 AM Nathaniel Hansen  MRN:  [id number] Principal Problem: Polysubstance abuse [facility]) Discharge Diagnoses: Principal Problem:   Polysubstance abuse ([place]) Active Problems:   Opioid dependence ([place])   Subjective: Patient assessed by nurse practitioner along with Dr. Dwyane Dee.  Patient alert and oriented, answers appropriately.  Patient states "I feel better today."  Patient denies suicidal and homicidal ideations.  Patient denies history of self-harm.  Patient denies hallucinations.  Patient reports history of methamphetamine use, states "I have been clean for about 2 weeks."  Patient request information regarding substance use treatment resources, peers support consult ordered.  Patient reports living home with mother, Nathaniel Hansen.  Patient gives verbal consent to speak with mother Nathaniel Hansen [phone]. Spoke with patient's mother, Nathaniel Hansen: Patient's mother denies any concerns for patient safety.  Patient's mother denies patient has access to weapons.  Patient's mother expresses concerns for patient substance use, states "he has had some sickness recently and I hope he stopped using now." Dr. Dwyane Dee agrees with plan for discharge with follow-up outpatient psychiatry and substance use resources.  Total Time spent with patient: 30 minutes  Past Psychiatric History: polysubstance Use Disorder  Past Medical History:  Past Medical History:  Diagnosis Date  . Asthma   . Opioid abuse ([place])    No past surgical history on file. Family History: No family history on file. Family Psychiatric  History: Denies Social History:  Social History   Substance and Sexual Activity  Alcohol Use Yes   Comment: stopped alcohol about 3 month ago      Social History   Substance and Sexual Activity  Drug Use Yes  . Types: Marijuana, Heroin   Comment: "sometimes" opioids, heroin use about a month and a half ago     Social History   Socioeconomic History   . Marital status: Single    Spouse name: Not on file  . Number of children: Not on file  . Years of education: Not on file  . Highest education level: Not on file  Occupational History  . Not on file  Tobacco Use  . Smoking status: Current Every Day Smoker    Packs/day: 1.00    Types: Cigarettes  . Smokeless tobacco: Never Used  Substance and Sexual Activity  . Alcohol use: Yes    Comment: stopped alcohol about 3 month ago   . Drug use: Yes    Types: Marijuana, Heroin    Comment: "sometimes" opioids, heroin use about a month and a half ago   . Sexual activity: Not on file  Other Topics Concern  . Not on file  Social History Narrative  . Not on file   Social Determinants of Health   Financial Resource Strain:   . Difficulty of Paying Living Expenses: Not on file  Food Insecurity:   . Worried About Charity fundraiser in the Last Year: Not on file  . Ran Out of Food in the Last Year: Not on file  Transportation Needs:   . Lack of Transportation (Medical): Not on file  . Lack of Transportation (Non-Medical): Not on file  Physical Activity:   . Days of Exercise per Week: Not on file  . Minutes of Exercise per Session: Not on file  Stress:   . Feeling of Stress : Not on file  Social Connections:   . Frequency of Communication with Friends and Family: Not on file  . Frequency of Social Gatherings with Friends and Family: Not on  file  . Attends Religious Services: Not on file  . Active Member of Clubs or Organizations: Not on file  . Attends Banker Meetings: Not on file  . Marital Status: Not on file    Has this patient used any form of tobacco in the last 30 days? (Cigarettes, Smokeless Tobacco, Cigars, and/or Pipes) A prescription for an FDA-approved tobacco cessation medication was offered at discharge and the patient refused  Current Medications: Current Facility-Administered Medications  Medication Dose Route Frequency Provider Last Rate Last Admin   . acetaminophen (TYLENOL) tablet 650 mg  650 mg Oral Q4H PRN Fayrene Helper, PA-C      . alum & mag hydroxide-simeth (MAALOX/MYLANTA) 200-200-20 MG/5ML suspension 30 mL  30 mL Oral Q6H PRN Fayrene Helper, PA-C      . buprenorphine-naloxone (SUBOXONE) 8-2 mg per SL tablet 1 tablet  1 tablet Sublingual BID Fayrene Helper, PA-C   1 tablet at 11/07/19 2151  . nicotine (NICODERM CQ - dosed in mg/24 hours) patch 21 mg  21 mg Transdermal Daily Fayrene Helper, PA-C   21 mg at 11/07/19 1000  . ondansetron (ZOFRAN) tablet 4 mg  4 mg Oral Q8H PRN Fayrene Helper, PA-C      . sodium chloride 0.9 % bolus 2,000 mL  2,000 mL Intravenous Once Gerhard Munch, MD      . zolpidem [facility]) tablet 5 mg  5 mg Oral QHS PRN Fayrene Helper, PA-C       Current Outpatient Medications  Medication Sig Dispense Refill  . buprenorphine-naloxone (SUBOXONE) 8-2 mg SUBL SL tablet Place 1 tablet under the tongue 2 (two) times daily. 60 tablet 0  . gabapentin (NEURONTIN) 400 MG capsule Take 2 capsules (800 mg total) by mouth 3 (three) times daily. (Patient taking differently: Take 800 mg by mouth 3 (three) times daily as needed (neuropathy). ) 180 capsule 0  . hydrOXYzine (ATARAX/VISTARIL) 50 MG tablet Take 1 tablet (50 mg total) by mouth every 8 (eight) hours as needed for itching. 60 tablet 0  . methocarbamol (ROBAXIN) 500 MG tablet Take 1 tablet (500 mg total) by mouth every 8 (eight) hours as needed for muscle spasms. 30 tablet 0  . traZODone (DESYREL) 150 MG tablet Take 1 tablet (150 mg total) by mouth at bedtime as needed for sleep. 30 tablet 0  . nicotine (NICODERM CQ - DOSED IN MG/24 HOURS) 21 mg/24hr patch Place 1 patch (21 mg total) onto the skin daily. 28 patch 0  . senna-docusate (SENOKOT-S) 8.6-50 MG tablet Take 1 tablet by mouth 2 (two) times daily. 60 tablet 0   PTA Medications: (Not in a hospital admission)   Musculoskeletal: Strength & Muscle Tone: within normal limits Gait & Station: normal Patient leans: N/A  Psychiatric  Specialty Exam: Physical Exam Vitals and nursing note reviewed.  Constitutional:      Appearance: He is well-developed.  HENT:     Head: Normocephalic.  Cardiovascular:     Rate and Rhythm: Normal rate.  Pulmonary:     Effort: Pulmonary effort is normal.  Neurological:     Mental Status: He is alert and oriented to person, place, and time.  Psychiatric:        Mood and Affect: Mood normal.        Behavior: Behavior normal.        Thought Content: Thought content normal.        Judgment: Judgment normal.     Review of Systems  Constitutional: Negative.   HENT:  Negative.   Eyes: Negative.   Respiratory: Negative.   Cardiovascular: Negative.   Gastrointestinal: Negative.   Genitourinary: Negative.   Musculoskeletal: Negative.   Skin: Negative.   Neurological: Negative.     Blood pressure 107/67, pulse 70, temperature 98.5 F (36.9 C), temperature source Oral, resp. rate 18, height 5\' 11"  (1.803 m), weight 74.5 kg, SpO2 100 %.Body mass index is 22.91 kg/m.  General Appearance: Casual  Eye Contact:  Good  Speech:  Clear and Coherent and Normal Rate  Volume:  Normal  Mood:  Euthymic  Affect:  Appropriate and Congruent  Thought Process:  Coherent, Goal Directed and Descriptions of Associations: Intact  Orientation:  Full (Time, Place, and Person)  Thought Content:  WDL and Logical  Suicidal Thoughts:  No  Homicidal Thoughts:  No  Memory:  Immediate;   Good Recent;   Good Remote;   Good  Judgement:  Good  Insight:  Good  Psychomotor Activity:  Normal  Concentration:  Concentration: Good and Attention Span: Good  Recall:  Good  Fund of Knowledge:  Good  Language:  Good  Akathisia:  No  Handed:  Right  AIMS (if indicated):     Assets:  Communication Skills Desire for Improvement Financial Resources/Insurance Housing Social Support  ADL's:  Intact  Cognition:  WNL  Sleep:        Demographic Factors:  Male  Loss Factors: NA  Historical  Factors: NA  Risk Reduction Factors:   Living with another person, especially a relative, Positive social support, Positive therapeutic relationship and Positive coping skills or problem solving skills  Continued Clinical Symptoms:  Alcohol/Substance Abuse/Dependencies  Cognitive Features That Contribute To Risk:  None    Suicide Risk:  Minimal: No identifiable suicidal ideation.  Patients presenting with no risk factors but with morbid ruminations; may be classified as minimal risk based on the severity of the depressive symptoms    Plan Of Care/Follow-up recommendations:  Other:  Follow up with outpatient resources and substance use treatment resources  Disposition: Discharge Patrcia Dollyina L Tate, FNP 11/08/2019, 10:49 AM

## 2019-11-08 NOTE — Patient Outreach (Addendum)
[facility] met with the patient in order to provide substance use recovery support and provide information for substance use recovery resources. Patient reports a past history of opioid use. Patient currently goes to [facility] in [place] for suboxone MAT treatment. Patient reports his suboxone treatment has helped him with opioid related cravings and patient reports that he has not relapsed on other opioid related substances since starting his suboxone treatment at [facility]. Patient is interested in getting connected to dual diagnosis counseling resources and substance use recovery support groups. [facility]PSS provided the patient with contact infomration/flier detailing services for dual diagnosis resources in [place] and [place]. CPSS also provided the patient with additional substance use recovery resources including a [place] online/in-person NA meeting list, residential substance use treatment center list, and [facility]PSS contact information. [facility]PSS strongly encouraged the patient to follow up with [facility] if needed for further help with [facility] substance use recovery outreach services.

## 2019-11-08 NOTE — Discharge Instructions (Signed)
For your substance abuse treatment needs, you are advised to continue treatment with your current provider:       [facility]      [address] [place], [place] [address]      [phone]  Another option available in your community that offers substance abuse therapy and group treatment is [facility]:       [employer], [address] [place]      [place], [place] [address]      [phone]  For your mental health treatment needs, contact [facility]:       [address]      [place], [place] [address]      [phone]

## 2019-11-08 NOTE — BH Assessment (Signed)
Nathaniel Hansen  Per Letitia Libra, FNP, this pt does not require psychiatric hospitalization at this time.  Pt is to be discharged from [facility] with referral information for providers of mental health and substance abuse treatment in [place], pt's community of residence.  This has been included in pt's discharge instructions.  Pt's nurse, Melody, has been notified.  Jalene Mullet, [place] Triage Specialist [phone]

## 2019-11-08 NOTE — ED Provider Notes (Addendum)
A.m. labs consistent with dehydration with elevated BUN, though creatinine has improved since arrival.  With this patient was treated with additional IV fluids, remains waiting for behavioral health placement.   Carmin Muskrat, MD 11/08/19 1003   12:00 PM Patient sitting upright, speaking clearly, in no distress, states that he feels substantially better having received IV fluids. Creatinine has been trending in an appropriate direction, he is able to tolerate oral resuscitation, was encouraged to do so. He has been cleared from behavioral health for discharge, will follow up with his clinic tomorrow. He does request Suboxone dosing for the day as he will be unable to attend his clinic.   Carmin Muskrat, MD 11/08/19 1200

## 2020-01-22 ENCOUNTER — Other Ambulatory Visit: Payer: Self-pay

## 2020-01-22 ENCOUNTER — Encounter (HOSPITAL_COMMUNITY): Payer: Self-pay | Admitting: *Deleted

## 2020-01-22 ENCOUNTER — Emergency Department (HOSPITAL_COMMUNITY): Payer: Self-pay

## 2020-01-22 ENCOUNTER — Emergency Department (HOSPITAL_COMMUNITY)
Admission: EM | Admit: 2020-01-22 | Discharge: 2020-01-23 | Disposition: A | Discharge status: Home / Self Care | Payer: Self-pay | Attending: Emergency Medicine | Admitting: Emergency Medicine

## 2020-01-22 DIAGNOSIS — E876 Hypokalemia: Secondary | ICD-10-CM | POA: Insufficient documentation

## 2020-01-22 DIAGNOSIS — J45909 Unspecified asthma, uncomplicated: Secondary | ICD-10-CM | POA: Insufficient documentation

## 2020-01-22 DIAGNOSIS — F17210 Nicotine dependence, cigarettes, uncomplicated: Secondary | ICD-10-CM | POA: Insufficient documentation

## 2020-01-22 DIAGNOSIS — R002 Palpitations: Secondary | ICD-10-CM | POA: Insufficient documentation

## 2020-01-22 DIAGNOSIS — Z79899 Other long term (current) drug therapy: Secondary | ICD-10-CM | POA: Insufficient documentation

## 2020-01-22 NOTE — ED Triage Notes (Signed)
Pt arrived to by ems with c/o fast heartbeat and "I feel like I am going to pass out" pt agitated with ems, being loud, at times will not answer questions during triage, resp rate 40, c/o tingling to bilateral hands. Pt instructed to slow his breathing. Pt unsure of what he was doing when this started. Denies taking any drugs,

## 2020-01-23 ENCOUNTER — Other Ambulatory Visit: Payer: Self-pay

## 2020-01-23 LAB — BASIC METABOLIC PANEL
Anion gap: 11 (ref 5–15)
Anion gap: 11 (ref 5–15)
BUN: 15 mg/dL (ref 6–20)
BUN: 14 mg/dL (ref 6–20)
CO2: 20 mmol/L — ABNORMAL LOW (ref 22–32)
CO2: 21 mmol/L — ABNORMAL LOW (ref 22–32)
Calcium: 9.2 mg/dL (ref 8.9–10.3)
Calcium: 8.5 mg/dL — ABNORMAL LOW (ref 8.9–10.3)
Chloride: 101 mmol/L (ref 98–111)
Chloride: 103 mmol/L (ref 98–111)
Creatinine, Ser: 1.35 mg/dL — ABNORMAL HIGH (ref 0.61–1.24)
Creatinine, Ser: 1.29 mg/dL — ABNORMAL HIGH (ref 0.61–1.24)
GFR calc Af Amer: >60 mL/min (ref >60)
GFR calc Af Amer: >60 mL/min (ref >60)
GFR calc non Af Amer: >60 mL/min (ref >60)
GFR calc non Af Amer: >60 mL/min (ref >60)
Glucose, Bld: 93 mg/dL (ref 70–99)
Glucose, Bld: 79 mg/dL (ref 70–99)
Potassium: 2.7 mmol/L — CL (ref 3.5–5.1)
Potassium: 3.3 mmol/L — ABNORMAL LOW (ref 3.5–5.1)
Sodium: 132 mmol/L — ABNORMAL LOW (ref 135–145)
Sodium: 135 mmol/L (ref 135–145)

## 2020-01-23 LAB — TROPONIN I (HIGH SENSITIVITY)
Troponin I (High Sensitivity): 2 ng/L (ref <18)
Troponin I (High Sensitivity): <2 ng/L (ref <18)

## 2020-01-23 LAB — CBC
HCT: 33.6 % — ABNORMAL LOW (ref 39.0–52.0)
Hemoglobin: 10.5 g/dL — ABNORMAL LOW (ref 13.0–17.0)
MCH: 25.5 pg — ABNORMAL LOW (ref 26.0–34.0)
MCHC: 31.3 g/dL (ref 30.0–36.0)
MCV: 81.8 fL (ref 80.0–100.0)
Platelets: 215 10*3/uL (ref 150–400)
RBC: 4.11 MIL/uL — ABNORMAL LOW (ref 4.22–5.81)
RDW: 16.7 % — ABNORMAL HIGH (ref 11.5–15.5)
WBC: 5.4 10*3/uL (ref 4.0–10.5)
nRBC: 0 % (ref 0.0–0.2)

## 2020-01-23 LAB — ACETAMINOPHEN LEVEL: Acetaminophen (Tylenol), Serum: <10 ug/mL — ABNORMAL LOW (ref 10–30)

## 2020-01-23 LAB — ETHANOL: Alcohol, Ethyl (B): <10 mg/dL (ref <10)

## 2020-01-23 LAB — SALICYLATE LEVEL: Salicylate Lvl: <7 mg/dL — ABNORMAL LOW (ref 7.0–30.0)

## 2020-01-23 LAB — D-DIMER, QUANTITATIVE: D-Dimer, Quant: 0.27 ug/mL-FEU (ref 0.00–0.50)

## 2020-01-23 LAB — MAGNESIUM: Magnesium: 1.8 mg/dL (ref 1.7–2.4)

## 2020-01-23 MED ORDER — POTASSIUM CHLORIDE 10 MEQ/100ML IV SOLN
10.0000 meq | Freq: Once | INTRAVENOUS | Status: AC
  Administered 2020-01-23: 10 meq via INTRAVENOUS
  Filled 2020-01-23: qty 100

## 2020-01-23 MED ORDER — POTASSIUM CHLORIDE CRYS ER 20 MEQ PO TBCR: 20.0000 meq | EXTENDED_RELEASE_TABLET | Freq: Two times a day (BID) | ORAL | 0 refills | Status: DC

## 2020-01-23 MED ORDER — POTASSIUM CHLORIDE CRYS ER 20 MEQ PO TBCR
20.0000 meq | EXTENDED_RELEASE_TABLET | Freq: Once | ORAL | Status: AC
  Administered 2020-01-23: 20 meq via ORAL
  Filled 2020-01-23: qty 1

## 2020-01-23 NOTE — ED Provider Notes (Signed)
[facility] EMERGENCY DEPARTMENT Provider Note   CSN: [id number] Arrival date & time: 01/22/20  2239     History Chief Complaint  Patient presents with  . Tachycardia    Nathaniel Hansen is a 34 y.o. 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  He states he was simply sitting at home when he felt his heart start to race in association with chest pressure, he started breathing fast and shallow, developed tingling in his bilateral hands and felt like he was going to pass out.  He denies chest pain, abdominal pain, no nausea, vomiting, no fevers or chills.  He denies any drug use today.  He does state he has a history of panic attack and has been on anxiety medication, but has not had this medication in over 1 month.  His symptoms are improved at this time although he endorses continued mild midsternal chest pressure.  He has had no treatment prior to arrival.  HPI     Past Medical History:  Diagnosis Date  . Asthma   . Opioid abuse [facility])     Patient Active Problem List   Diagnosis Date Noted  . Polysubstance abuse (HCC) 11/08/2019  . Streptococcal sepsis, unspecified (HCC) 10/27/2019  . Opioid dependence (HCC) 10/23/2019  . Encounter for monitoring Suboxone maintenance therapy 10/23/2019  . Leukocytosis 10/23/2019  . Fever, unspecified 10/23/2019  . 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 Streptococcal Mitis/Oralis Bacteremia 10/08/2019  . Septic shock (HCC) 10/07/2019  . CAP (community acquired pneumonia) 10/07/2019  . Anemia 10/07/2019  . Lactic acidosis 10/07/2019  . Chronic back pain 10/07/2019    History  reviewed. No pertinent surgical history.     No family history on file.  Social History   Tobacco Use  . Smoking status: Current Every Day Smoker    Packs/day: 1.00    Types: Cigarettes  . Smokeless tobacco: Never Used  Substance Use Topics  . Alcohol use: Yes    Comment: stopped alcohol about 3 month ago   . Drug use: Yes    Types: Marijuana, Heroin    Comment: "sometimes" opioids, heroin use about a month and a half ago     Home Medications Prior to Admission medications   Medication Sig Start Date End Date Taking? Authorizing Provider  gabapentin (NEURONTIN) 400 MG capsule Take 2 capsules (800 mg total) by mouth 3 (three) times daily. Patient taking differently: Take 800 mg by mouth 3 (three) times daily as needed (neuropathy).  10/30/19 11/29/19  Cleora Fleet, MD  hydrOXYzine (ATARAX/VISTARIL) 50 MG tablet Take 1 tablet (50 mg total) by mouth every 8 (eight) hours as needed for itching. 10/30/19   Johnson, Clanford L, MD  methocarbamol (ROBAXIN) 500 MG tablet Take 1 tablet (500 mg total) by mouth every 8 (eight) hours as needed for muscle spasms. 10/30/19   Johnson, Clanford L, MD  nicotine (NICODERM CQ - DOSED IN MG/24 HOURS) 21 mg/24hr patch Place 1 patch (21 mg total) onto the skin daily. 10/31/19   Johnson, Clanford L, MD  traZODone (DESYREL) 150 MG tablet Take 1 tablet (150 mg total) by mouth at bedtime as needed for sleep. 10/30/19 11/29/19  Johnson, Clanford L,  MD    Allergies    Patient has no known allergies.  Review of Systems   Review of Systems  Constitutional: Negative for chills, diaphoresis and fever.  HENT: Negative for congestion and sore throat.   Eyes: Negative.   Respiratory: Positive for chest tightness and shortness of breath.   Cardiovascular: Positive for palpitations. Negative for chest pain and leg swelling.  Gastrointestinal: Negative for abdominal pain, nausea and vomiting.  Genitourinary: Negative.   Musculoskeletal: Negative for  arthralgias, joint swelling and neck pain.  Skin: Negative.  Negative for rash and wound.  Neurological: Positive for light-headedness. Negative for dizziness, weakness, numbness and headaches.  Psychiatric/Behavioral: Negative.     Physical Exam Updated Vital Signs BP 125/65   Pulse (!) 104   Temp 97.9 F (36.6 C) (Oral)   Resp (!) 40   Ht 5\' 11"  (1.803 m)   Wt 68 kg   SpO2 100%   BMI 20.92 kg/m   Physical Exam Vitals and nursing note reviewed.  Constitutional:      Appearance: He is well-developed.  HENT:     Head: Normocephalic and atraumatic.  Eyes:     Conjunctiva/sclera: Conjunctivae normal.  Cardiovascular:     Rate and Rhythm: Regular rhythm. Tachycardia present.     Heart sounds: Normal heart sounds.     Comments: Borderline tachycardia. Pulmonary:     Effort: Pulmonary effort is normal. No respiratory distress.     Breath sounds: Normal breath sounds. No wheezing.     Comments: Patient was initially tachypneic upon presentation but not during exam. Abdominal:     General: Bowel sounds are normal.     Palpations: Abdomen is soft.     Tenderness: There is no abdominal tenderness.  Musculoskeletal:        General: Normal range of motion.     Cervical back: Normal range of motion.     Right lower leg: No edema.     Left lower leg: No edema.  Skin:    General: Skin is warm and dry.  Neurological:     Mental Status: He is alert and oriented to person, place, and time.  Psychiatric:        Mood and Affect: Mood is anxious.     ED Results / Procedures / Treatments   Labs (all labs ordered are listed, but only abnormal results are displayed) Labs Reviewed  BASIC METABOLIC PANEL - Abnormal; Notable for the following components:      Result Value   Sodium 132 (*)    Potassium 2.7 (*)    CO2 20 (*)    Creatinine, Ser 1.35 (*)    All other components within normal limits  CBC - Abnormal; Notable for the following components:   RBC 4.11 (*)    Hemoglobin  10.5 (*)    HCT 33.6 (*)    MCH 25.5 (*)    RDW 16.7 (*)    All other components within normal limits  ACETAMINOPHEN LEVEL - Abnormal; Notable for the following components:   Acetaminophen (Tylenol), Serum <10 (*)    All other components within normal limits  SALICYLATE LEVEL - Abnormal; Notable for the following components:   Salicylate Lvl <7.0 (*)    All other components within normal limits  ETHANOL  D-DIMER, QUANTITATIVE (NOT AT [facility])  RAPID URINE DRUG SCREEN, HOSP PERFORMED  URINALYSIS, ROUTINE W REFLEX MICROSCOPIC  MAGNESIUM  TROPONIN I (HIGH SENSITIVITY)  TROPONIN I (HIGH SENSITIVITY)    EKG EKG Interpretation  Date/Time:  Wednesday January 22 2020 22:51:59 EST Ventricular Rate:  106 PR Interval:  154 QRS Duration: 100 QT Interval:  406 QTC Calculation: 539 R Axis:   124 Text Interpretation: Sinus tachycardia Right axis deviation Right ventricular hypertrophy T wave abnormality, consider inferior ischemia Prolonged QT Abnormal ECG No significant change was found Rate faster Confirmed by Ezequiel Essex [phone]) on 01/22/2020 11:43:17 PM   Radiology DG Chest 2 View  Result Date: 01/22/2020 CLINICAL DATA:  Chest pain and shortness of breath EXAM: CHEST - 2 VIEW COMPARISON:  10/12/2019 FINDINGS: Cardiac shadow is within normal limits. The lungs are well aerated bilaterally. No focal infiltrate or sizable effusion is seen. No acute bony abnormality is seen. IMPRESSION: No acute abnormality noted. Electronically Signed   By: Inez Catalina M.D.   On: 01/22/2020 23:29    Procedures Procedures (including critical care time)  Medications Ordered in ED Medications  potassium chloride 10 mEq in 100 mL IVPB (has no administration in time range)  potassium chloride SA (KLOR-CON) CR tablet 20 mEq (has no administration in time range)  potassium chloride 10 mEq in 100 mL IVPB (has no administration in time range)    ED Course  I have reviewed the triage vital signs and the  nursing notes.  Pertinent labs & imaging results that were available during my care of the patient were reviewed by me and considered in my medical decision making (see chart for details).    MDM Rules/Calculators/A&P                      Patient with a history suggesting possible anxiety/panic attack.  However he does have a significant history of IV drug use with endocarditis complications.  Tonight his lab work is revealing for significant hypokalemia at 2.7.  He has a normal white blood cell count.  His hemoglobin is 10.5 which is improved from prior recent checks, his creatinine is also elevated 1.35 but is improved from December where it was 1.90.  His BUN is normal at 15 suggesting he is not dehydrated.  He is being given 2 rounds of IV potassium 10 M EQ's plus an additional p.o. dose of 20 M EQ.  Magnesium level was also ordered.  His D-dimer and his initial troponin are both normal range.  Delta troponin is pending.  Patient care assumed by Dr. Wyvonnia Dusky Final Clinical Impression(s) / ED Diagnoses Final diagnoses:  Palpitations  Hypokalemia    Rx / DC Orders ED Discharge Orders    None       Landis Martins 01/23/20 0116    Ezequiel Essex, MD 01/23/20 0600

## 2020-01-23 NOTE — ED Provider Notes (Signed)
Care assumed from PA-C Idol.  Patient with history of asthma, IV drug abuse, previous admission for endocarditis in November 2020 here with chest tightness and shortness of breath that onset while he was resting at home.  Associated with tingling in his hands and tightness in his chest with shortness of breath.  History of panic attacks and this feels similar.  He denies any recent IV drug abuse since his hospitalization in November.  Lungs are clear.  He does appear anxious.  EKG is sinus rhythm with T wave inversions inferiorly.  Troponin negative.  D-dimer negative.  Low suspicion for ACS or PE.  Hypokalemia noted and repleted.  Has improved to 3.3.  Troponin negative x2.  Patient sleeping on recheck.  Heart rate and respirations have normalized.  No hypoxia.  Chest pain is atypical for ACS.  Low suspicion for pneumonia, pneumothorax, pulmonary embolism, aortic dissection.  Patient will be treated supportively.  Follow-up with potassium replacement.  Return precautions discussed.   EKG Interpretation  Date/Time:  Thursday January 23 2020 05:14:22 EST Ventricular Rate:  61 PR Interval:  154 QRS Duration: 106 QT Interval:  434 QTC Calculation: 438 R Axis:   83 Text Interpretation: Sinus rhythm RSR' in V1 or V2, right VCD or RVH Baseline wander in lead(s) V2 Rate slower Confirmed by Glynn Octave [phone]) on 01/23/2020 5:18:06 AM         Glynn Octave, MD 01/23/20 [phone]

## 2020-01-23 NOTE — Discharge Instructions (Signed)
There is no evidence of heart attack or blood clot in the lung.  Take the potassium supplementation as prescribed.  Do not use any illicit drugs.  Follow-up with your primary doctor for recheck next week.  Return to the ED if you have chest pain, shortness of breath, or other concerns.

## 2020-01-23 NOTE — ED Notes (Signed)
Date and time results received: 01/23/20 0103  Test: K Critical Value: 2.7  Name of Provider Notified: Rancour  Orders Received? Or Actions Taken?: na

## 2020-01-25 ENCOUNTER — Encounter (HOSPITAL_COMMUNITY): Payer: Self-pay | Admitting: Emergency Medicine

## 2020-01-25 ENCOUNTER — Emergency Department (HOSPITAL_COMMUNITY)
Admission: EM | Admit: 2020-01-25 | Discharge: 2020-01-25 | Disposition: A | Discharge status: Home / Self Care | Payer: Self-pay | Attending: Emergency Medicine | Admitting: Emergency Medicine

## 2020-01-25 ENCOUNTER — Encounter (HOSPITAL_COMMUNITY): Payer: Self-pay

## 2020-01-25 ENCOUNTER — Other Ambulatory Visit: Payer: Self-pay

## 2020-01-25 ENCOUNTER — Emergency Department (HOSPITAL_COMMUNITY)
Admission: EM | Admit: 2020-01-25 | Discharge: 2020-01-27 | Disposition: A | Discharge status: Other Acute Inpatient Hospital | Payer: Self-pay | Attending: Emergency Medicine | Admitting: Emergency Medicine

## 2020-01-25 DIAGNOSIS — F329 Major depressive disorder, single episode, unspecified: Secondary | ICD-10-CM | POA: Insufficient documentation

## 2020-01-25 DIAGNOSIS — F32A Depression, unspecified: Secondary | ICD-10-CM

## 2020-01-25 DIAGNOSIS — J45909 Unspecified asthma, uncomplicated: Secondary | ICD-10-CM | POA: Insufficient documentation

## 2020-01-25 DIAGNOSIS — Z20822 Contact with and (suspected) exposure to covid-19: Secondary | ICD-10-CM | POA: Insufficient documentation

## 2020-01-25 DIAGNOSIS — F1910 Other psychoactive substance abuse, uncomplicated: Secondary | ICD-10-CM

## 2020-01-25 DIAGNOSIS — F1420 Cocaine dependence, uncomplicated: Secondary | ICD-10-CM | POA: Insufficient documentation

## 2020-01-25 DIAGNOSIS — F1914 Other psychoactive substance abuse with psychoactive substance-induced mood disorder: Secondary | ICD-10-CM | POA: Insufficient documentation

## 2020-01-25 DIAGNOSIS — F17210 Nicotine dependence, cigarettes, uncomplicated: Secondary | ICD-10-CM | POA: Insufficient documentation

## 2020-01-25 DIAGNOSIS — R45851 Suicidal ideations: Secondary | ICD-10-CM | POA: Insufficient documentation

## 2020-01-25 DIAGNOSIS — F1120 Opioid dependence, uncomplicated: Secondary | ICD-10-CM | POA: Insufficient documentation

## 2020-01-25 DIAGNOSIS — Z79899 Other long term (current) drug therapy: Secondary | ICD-10-CM | POA: Insufficient documentation

## 2020-01-25 DIAGNOSIS — F419 Anxiety disorder, unspecified: Secondary | ICD-10-CM | POA: Insufficient documentation

## 2020-01-25 DIAGNOSIS — F1924 Other psychoactive substance dependence with psychoactive substance-induced mood disorder: Secondary | ICD-10-CM | POA: Insufficient documentation

## 2020-01-25 HISTORY — DX: Cocaine abuse, uncomplicated: F14.10

## 2020-01-25 HISTORY — DX: Endocarditis, valve unspecified: I38

## 2020-01-25 HISTORY — DX: Anxiety disorder, unspecified: F41.9

## 2020-01-25 LAB — COMPREHENSIVE METABOLIC PANEL
ALT: 13 U/L (ref 0–44)
ALT: 13 U/L (ref 0–44)
AST: 23 U/L (ref 15–41)
AST: 22 U/L (ref 15–41)
Albumin: 3.9 g/dL (ref 3.5–5.0)
Albumin: 4.1 g/dL (ref 3.5–5.0)
Alkaline Phosphatase: 50 U/L (ref 38–126)
Alkaline Phosphatase: 52 U/L (ref 38–126)
Anion gap: 8 (ref 5–15)
Anion gap: 8 (ref 5–15)
BUN: 9 mg/dL (ref 6–20)
BUN: 9 mg/dL (ref 6–20)
CO2: 25 mmol/L (ref 22–32)
CO2: 26 mmol/L (ref 22–32)
Calcium: 8.7 mg/dL — ABNORMAL LOW (ref 8.9–10.3)
Calcium: 9.1 mg/dL (ref 8.9–10.3)
Chloride: 104 mmol/L (ref 98–111)
Chloride: 103 mmol/L (ref 98–111)
Creatinine, Ser: 1.15 mg/dL (ref 0.61–1.24)
Creatinine, Ser: 1.21 mg/dL (ref 0.61–1.24)
GFR calc Af Amer: >60 mL/min (ref >60)
GFR calc Af Amer: >60 mL/min (ref >60)
GFR calc non Af Amer: >60 mL/min (ref >60)
GFR calc non Af Amer: >60 mL/min (ref >60)
Glucose, Bld: 126 mg/dL — ABNORMAL HIGH (ref 70–99)
Glucose, Bld: 83 mg/dL (ref 70–99)
Potassium: 3.5 mmol/L (ref 3.5–5.1)
Potassium: 3.7 mmol/L (ref 3.5–5.1)
Sodium: 137 mmol/L (ref 135–145)
Sodium: 137 mmol/L (ref 135–145)
Total Bilirubin: 0.4 mg/dL (ref 0.3–1.2)
Total Bilirubin: 0.6 mg/dL (ref 0.3–1.2)
Total Protein: 7.6 g/dL (ref 6.5–8.1)
Total Protein: 8 g/dL (ref 6.5–8.1)

## 2020-01-25 LAB — CBC
HCT: 32.4 % — ABNORMAL LOW (ref 39.0–52.0)
HCT: 34.7 % — ABNORMAL LOW (ref 39.0–52.0)
Hemoglobin: 10 g/dL — ABNORMAL LOW (ref 13.0–17.0)
Hemoglobin: 10.5 g/dL — ABNORMAL LOW (ref 13.0–17.0)
MCH: 25.8 pg — ABNORMAL LOW (ref 26.0–34.0)
MCH: 25.7 pg — ABNORMAL LOW (ref 26.0–34.0)
MCHC: 30.9 g/dL (ref 30.0–36.0)
MCHC: 30.3 g/dL (ref 30.0–36.0)
MCV: 83.5 fL (ref 80.0–100.0)
MCV: 85 fL (ref 80.0–100.0)
Platelets: 266 10*3/uL (ref 150–400)
Platelets: 282 10*3/uL (ref 150–400)
RBC: 3.88 MIL/uL — ABNORMAL LOW (ref 4.22–5.81)
RBC: 4.08 MIL/uL — ABNORMAL LOW (ref 4.22–5.81)
RDW: 17 % — ABNORMAL HIGH (ref 11.5–15.5)
RDW: 17 % — ABNORMAL HIGH (ref 11.5–15.5)
WBC: 4.4 10*3/uL (ref 4.0–10.5)
WBC: 3.3 10*3/uL — ABNORMAL LOW (ref 4.0–10.5)
nRBC: 0 % (ref 0.0–0.2)
nRBC: 0 % (ref 0.0–0.2)

## 2020-01-25 LAB — ETHANOL
Alcohol, Ethyl (B): <10 mg/dL (ref <10)
Alcohol, Ethyl (B): <10 mg/dL (ref <10)

## 2020-01-25 LAB — URINALYSIS, ROUTINE W REFLEX MICROSCOPIC
Bilirubin Urine: NEGATIVE
Glucose, UA: NEGATIVE mg/dL
Hgb urine dipstick: NEGATIVE
Ketones, ur: NEGATIVE mg/dL
Leukocytes,Ua: NEGATIVE
Nitrite: NEGATIVE
Protein, ur: NEGATIVE mg/dL
Specific Gravity, Urine: 1.017 (ref 1.005–1.030)
pH: 5 (ref 5.0–8.0)

## 2020-01-25 LAB — RAPID URINE DRUG SCREEN, HOSP PERFORMED
Amphetamines: NOT DETECTED
Amphetamines: NOT DETECTED
Barbiturates: NOT DETECTED
Barbiturates: NOT DETECTED
Benzodiazepines: NOT DETECTED
Benzodiazepines: NOT DETECTED
Cocaine: POSITIVE — AB
Cocaine: POSITIVE — AB
Opiates: NOT DETECTED
Opiates: NOT DETECTED
Tetrahydrocannabinol: POSITIVE — AB
Tetrahydrocannabinol: POSITIVE — AB

## 2020-01-25 LAB — SALICYLATE LEVEL
Salicylate Lvl: <7 mg/dL — ABNORMAL LOW (ref 7.0–30.0)
Salicylate Lvl: <7 mg/dL — ABNORMAL LOW (ref 7.0–30.0)

## 2020-01-25 LAB — ACETAMINOPHEN LEVEL
Acetaminophen (Tylenol), Serum: <10 ug/mL — ABNORMAL LOW (ref 10–30)
Acetaminophen (Tylenol), Serum: <10 ug/mL — ABNORMAL LOW (ref 10–30)

## 2020-01-25 NOTE — ED Triage Notes (Signed)
Patient states that he want to harm himself and others. Patient states that is hearing voices telling him to harm himself and others. Patient has a history of anxiety.

## 2020-01-25 NOTE — ED Provider Notes (Signed)
Patient cleared by behavioral health for discharge home.   Vanetta Mulders, MD 01/25/20 [phone]

## 2020-01-25 NOTE — Progress Notes (Signed)
Pt presents with risk factors for suicide including thoughts of death, substance abuse, hopelessness, low self-worth, feels like a burden on family, and inability to contract for safety. Per Nira Conn, NP pt meets criteria for inpt tx. TTS to seek appropriate bed placement. EDP Devoria Albe, MD and Theresia Lo, RN have been advised.

## 2020-01-25 NOTE — ED Triage Notes (Signed)
Pt presents to ED requesting help with "getting off drugs". Pt says he uses cocaine and heroin, both IV. Pt says he last used cocaine last night around 2100 and last heroin use was 2 night ago. Pt does not appear intoxicated at this time. Pt is calm and cooperative. Denies SI and HI

## 2020-01-25 NOTE — ED Notes (Signed)
TTS in progress 

## 2020-01-25 NOTE — ED Notes (Signed)
Went to get patient resources for homeless shelters, patient left without homeless shelter information.   Patient was given resources about addiction and psychiatric resources for out patient treatment.

## 2020-01-25 NOTE — Discharge Instructions (Addendum)
Cleared by behavioral health for discharge home.  Follow-up as per behavioral health. 

## 2020-01-25 NOTE — BH Assessment (Signed)
Tele Assessment Note   Patient Name: Nathaniel Hansen MRN: [id number] Referring Physician:  Location of Patient:  Location of Provider: [place] is an 34 y.o. male who presents to the ED voluntarily. Pt reports he came to the ED because he wants help getting off of drugs. Pt reports he has been using drugs for the past 10 years and he feels that he is ready for help. Pt states he has been to a detox facility in the past and stayed sober for 1 year. Pt endorses SI, with no plan. Pt identifies his primary stressor as abusing drugs. Pt states he has been staying with his parents but he is homeless and he feels that he is a burden on others and that his parents do not really want him there. Pt denies HI at present but admits to thoughts of harming "people who want to harm me." Pt denies AVH at present but admits to previous [facility] whenever he is abusing drugs. Pt states he has lost friends and family members support due to his drug use and he feels helpless at this time. TTS asked the pt for consent to speak with collaterals and he declines stating he does not wish for TTS to contact anyone. Pt denies any prior inpt or psych hx. Pt reports sleep changes in which some days he will sleep all night and others he will only sleep for 4 hours.  Pt is dressed appropriately and makes eye contact throughout the assessment. Pt is slow to respond, appears drowsy and name has to be called multiple times throughout the assessment in order to remain engaged. Pt's mood is depressed and helpless and affect is congruent with mood.  Pt presents with risk factors for suicide including thoughts of death, substance abuse, hopelessness, low self-worth, feels like a burden on family, and inability to contract for safety. Per Lindon Romp, NP pt meets criteria for inpt tx. TTS to seek appropriate bed placement. EDP Rolland Porter, MD and Angela Burke, RN have been  advised.  Diagnosis: Cocaine use d/o, severe; Opioid use d/o, severe; Substance induced mood d/o  Past Medical History:  Past Medical History:  Diagnosis Date  . Asthma   . Cocaine abuse ([place])   . Endocarditis   . Opioid abuse ([place])     History reviewed. No pertinent surgical history.  Family History: No family history on file.  Social History:  reports that he has been smoking cigarettes. He has been smoking about 1.00 pack per day. He has never used smokeless tobacco. He reports current alcohol use. He reports current drug use. Drugs: Marijuana and Heroin.  Additional Social History:  Alcohol / Drug Use Pain Medications: See MAR Prescriptions: See MAR Over the Counter: See MAR History of alcohol / drug use?: Yes Longest period of sobriety (when/how long): 1 year Substance #1 Name of Substance 1: Cocaine 1 - Age of First Use: 23 1 - Amount (size/oz): excessive 1 - Frequency: daily 1 - Duration: ongoing 1 - Last Use / Amount: 01/24/20 Substance #2 Name of Substance 2: Heroin 2 - Age of First Use: 24 2 - Amount (size/oz): excessive 2 - Frequency: daily 2 - Duration: ongoing 2 - Last Use / Amount: 01/24/2020 Substance #3 Name of Substance 3: Meth 3 - Age of First Use: 20s 3 - Amount (size/oz): small amount 3 - Frequency: rare 3 - Duration: ongoing 3 - Last Use / Amount: 2 months ago  CIWA: CIWA-Ar  BP: 120/61 Pulse Rate: 75 COWS:    Allergies: No Known Allergies  Home Medications: (Not in a hospital admission)   OB/GYN Status:  No LMP for male patient.  General Assessment Data Location of Assessment: [facility] TTS Assessment: In system Is this a Tele or Face-to-Face Assessment?: Tele Assessment Is this an Initial Assessment or a Re-assessment for this encounter?: Initial Assessment Patient Accompanied by:: N/A Language Other than English: No Living Arrangements: Homeless/Shelter What gender do you identify as?: Male Marital status: Single Pregnancy  Status: No Living Arrangements: Alone Can pt return to current living arrangement?: Yes Admission Status: Voluntary Is patient capable of signing voluntary admission?: Yes Referral Source: Self/Family/Friend Insurance type: none     Crisis Care Plan Living Arrangements: Alone Name of Psychiatrist: none Name of Therapist: none  Education Status Is patient currently in school?: No Is the patient employed, unemployed or receiving disability?: Unemployed  Risk to self with the past 6 months Suicidal Ideation: Yes-Currently Present Has patient been a risk to self within the past 6 months prior to admission? : Yes(due to drug use) Suicidal Intent: No Has patient had any suicidal intent within the past 6 months prior to admission? : No Is patient at risk for suicide?: Yes Suicidal Plan?: No Has patient had any suicidal plan within the past 6 months prior to admission? : No Access to Means: No What has been your use of drugs/alcohol within the last 12 months?: cocaine, heroin Previous Attempts/Gestures: No Other Self Harm Risks: drug abuse, hopelessness Triggers for Past Attempts: None known Intentional Self Injurious Behavior: None Family Suicide History: No Recent stressful life event(s): Job Loss, Financial Problems, Other (Comment)(homeless, substance abuse) Persecutory voices/beliefs?: No Depression: Yes Depression Symptoms: Despondent, Feeling worthless/self pity, Insomnia Substance abuse history and/or treatment for substance abuse?: Yes Suicide prevention information given to non-admitted patients: Not applicable  Risk to Others within the past 6 months Homicidal Ideation: No Does patient have any lifetime risk of violence toward others beyond the six months prior to admission? : Yes (comment)(pt states "people who hurt me") Thoughts of Harm to Others: No Current Homicidal Intent: No Current Homicidal Plan: No Access to Homicidal Means: No History of harm to others?:  No Assessment of Violence: None Noted Does patient have access to weapons?: No Criminal Charges Pending?: No Does patient have a court date: No Is patient on probation?: No  Psychosis Hallucinations: Visual(substance induced) Delusions: None noted  Mental Status Report Appearance/Hygiene: Unremarkable Eye Contact: Good Motor Activity: Freedom of movement Speech: Slow Level of Consciousness: Drowsy Mood: Depressed, Helpless, Worthless, low self-esteem Affect: Depressed, Blunted, Flat, Sad Anxiety Level: None Thought Processes: Coherent, Relevant Judgement: Impaired Orientation: Person, Place, Time, Situation, Appropriate for developmental age Obsessive Compulsive Thoughts/Behaviors: None  Cognitive Functioning Concentration: Normal Memory: Remote Intact, Recent Intact Is patient IDD: No Insight: Poor Impulse Control: Poor Appetite: Good Have you had any weight changes? : No Change Sleep: Decreased Total Hours of Sleep: 6 Vegetative Symptoms: None  ADLScreening [facility]) Patient's cognitive ability adequate to safely complete daily activities?: Yes Patient able to express need for assistance with ADLs?: Yes Independently performs ADLs?: Yes (appropriate for developmental age)  Prior Inpatient Therapy Prior Inpatient Therapy: No  Prior Outpatient Therapy Prior Outpatient Therapy: No Does patient have an ACCT team?: No Does patient have Intensive In-House Services?  : No Does patient have [facility] services? : No Does patient have P4CC services?: No  ADL Screening (condition at time of admission) Patient's cognitive ability  adequate to safely complete daily activities?: Yes Is the patient deaf or have difficulty hearing?: No Does the patient have difficulty seeing, even when wearing glasses/contacts?: No Does the patient have difficulty concentrating, remembering, or making decisions?: Yes Patient able to express need for assistance with ADLs?:  Yes Does the patient have difficulty dressing or bathing?: No Independently performs ADLs?: Yes (appropriate for developmental age) Does the patient have difficulty walking or climbing stairs?: No Weakness of Legs: None Weakness of Arms/Hands: None  Home Assistive Devices/Equipment Home Assistive Devices/Equipment: None    Abuse/Neglect Assessment (Assessment to be complete while patient is alone) Abuse/Neglect Assessment Can Be Completed: Yes Physical Abuse: Denies Verbal Abuse: Denies Sexual Abuse: Denies Exploitation of patient/patient's resources: Denies Self-Neglect: Denies     Merchant [employer] officer (For Healthcare) Does Patient Have a Medical Advance Directive?: No Would patient like information on creating a medical advance directive?: No - Patient declined          Disposition: Pt presents with risk factors for suicide including thoughts of death, substance abuse, hopelessness, low self-worth, and inability to contract for safety. Per Nira Conn, NP pt meets criteria for inpt tx. TTS to seek appropriate bed placement.  Disposition Initial Assessment Completed for this Encounter: Yes Disposition of Patient: Admit Type of inpatient treatment program: Adult Patient refused recommended treatment: No  This service was provided via telemedicine using a 2-way, interactive audio and video technology.  Names of all persons participating in this telemedicine service and their role in this encounter. Name: Shella Spearing Role: Patient  Name: Princess Bruins Role: TTS  Name: Nira Conn, NP Role: [facility] Provider       Karolee Ohs 01/25/2020 5:51 AM

## 2020-01-25 NOTE — ED Provider Notes (Signed)
[facility] EMERGENCY DEPARTMENT Provider Note   CSN: [id number] Arrival date & time: 01/25/20  2107     History Chief Complaint  Patient presents with  . Medical Clearance    Nathaniel Hansen is a 34 y.o. male.  Patient returns now stating that he is suicidal.  Patient was just discharged earlier today after being cleared by behavioral health and for outpatient follow-up.  Patient does have a history of substance abuse mostly opiates and cocaine.  But patient is insisting that he is suicidal.  Will reconsult behavioral health.  We will recheck labs.  But his labs earlier today were fine.        Past Medical History:  Diagnosis Date  . Anxiety   . Asthma   . Cocaine abuse (HCC)   . Endocarditis   . Opioid abuse [facility])     Patient Active Problem List   Diagnosis Date Noted  . Polysubstance abuse (HCC) 11/08/2019  . Streptococcal sepsis, unspecified (HCC) 10/27/2019  . Opioid dependence (HCC) 10/23/2019  . Encounter for monitoring Suboxone maintenance therapy 10/23/2019  . Leukocytosis 10/23/2019  . Fever, unspecified 10/23/2019  . 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 Streptococcal Mitis/Oralis Bacteremia 10/08/2019  . Septic shock (HCC) 10/07/2019  . CAP (community acquired pneumonia) 10/07/2019  . Anemia 10/07/2019  . Lactic acidosis 10/07/2019  . Chronic back pain 10/07/2019    History reviewed. No pertinent surgical history.     History reviewed. No pertinent family history.  Social History   Tobacco Use  . Smoking status: Current Every Day Smoker    Packs/day: 1.00    Types: Cigarettes  . Smokeless tobacco: Never Used  Substance Use Topics  . Alcohol use: Yes    Comment: sometimes  . Drug use: Yes    Types: Marijuana, Heroin    Comment: "sometimes"  opioids, heroin     Home Medications Prior to Admission medications   Medication Sig Start Date End Date Taking? Authorizing Provider  buprenorphine (SUBUTEX) 2 MG SUBL SL tablet Place under the tongue daily.    [provider]  methocarbamol (ROBAXIN) 500 MG tablet Take 1 tablet (500 mg total) by mouth every 8 (eight) hours as needed for muscle spasms. Patient not taking: Reported on 01/25/2020 10/30/19   Cleora Fleet, MD    Allergies    Patient has no known allergies.  Review of Systems   Review of Systems  Constitutional: Negative for chills and fever.  HENT: Negative for congestion, rhinorrhea and sore throat.   Eyes: Negative for visual disturbance.  Respiratory: Negative for cough and shortness of breath.   Cardiovascular: Negative for chest pain and leg swelling.  Gastrointestinal: Negative for abdominal pain, diarrhea, nausea and vomiting.  Genitourinary: Negative for dysuria.  Musculoskeletal: Negative for back pain and neck pain.  Skin: Negative for rash.  Neurological: Negative for dizziness, light-headedness and headaches.  Hematological: Does not bruise/bleed easily.  Psychiatric/Behavioral: Positive for suicidal ideas. Negative for confusion.    Physical Exam Updated Vital Signs BP 133/80 (BP Location: Right Arm)   Pulse 61   Temp 98.4 F (36.9 C) (Oral)   Resp 18   Ht 1.829 m (6')   Wt 75.8 kg   SpO2 100%   BMI 22.65 kg/m   Physical Exam Vitals and nursing note reviewed.  Constitutional:  Appearance: Normal appearance. He is well-developed.  HENT:     Head: Normocephalic and atraumatic.  Eyes:     Conjunctiva/sclera: Conjunctivae normal.     Pupils: Pupils are equal, round, and reactive to light.  Cardiovascular:     Rate and Rhythm: Normal rate and regular rhythm.     Heart sounds: No murmur.  Pulmonary:     Effort: Pulmonary effort is normal. No respiratory distress.     Breath sounds: Normal breath sounds.  Abdominal:      Palpations: Abdomen is soft.     Tenderness: There is no abdominal tenderness.  Musculoskeletal:        General: Normal range of motion.     Cervical back: Normal range of motion and neck supple.  Skin:    General: Skin is warm and dry.  Neurological:     General: No focal deficit present.     Mental Status: He is alert and oriented to person, place, and time.     ED Results / Procedures / Treatments   Labs (all labs ordered are listed, but only abnormal results are displayed) Labs Reviewed  CBC - Abnormal; Notable for the following components:      Result Value   WBC 3.3 (*)    RBC 4.08 (*)    Hemoglobin 10.5 (*)    HCT 34.7 (*)    MCH 25.7 (*)    RDW 17.0 (*)    All other components within normal limits  COMPREHENSIVE METABOLIC PANEL  ETHANOL  SALICYLATE LEVEL  ACETAMINOPHEN LEVEL  RAPID URINE DRUG SCREEN, HOSP PERFORMED   Results for orders placed or performed during the hospital encounter of 01/25/20  Comprehensive metabolic panel  Result Value Ref Range   Sodium 137 135 - 145 mmol/L   Potassium 3.7 3.5 - 5.1 mmol/L   Chloride 103 98 - 111 mmol/L   CO2 26 22 - 32 mmol/L   Glucose, Bld 83 70 - 99 mg/dL   BUN 9 6 - 20 mg/dL   Creatinine, Ser 2.99 0.61 - 1.24 mg/dL   Calcium 9.1 8.9 - 37.1 mg/dL   Total Protein 8.0 6.5 - 8.1 g/dL   Albumin 4.1 3.5 - 5.0 g/dL   AST 22 15 - 41 U/L   ALT 13 0 - 44 U/L   Alkaline Phosphatase 52 38 - 126 U/L   Total Bilirubin 0.6 0.3 - 1.2 mg/dL   GFR calc non Af Amer >60 >60 mL/min   GFR calc Af Amer >60 >60 mL/min   Anion gap 8 5 - 15  Ethanol  Result Value Ref Range   Alcohol, Ethyl (B) <10 <10 mg/dL  Salicylate level  Result Value Ref Range   Salicylate Lvl <7.0 (L) 7.0 - 30.0 mg/dL  Acetaminophen level  Result Value Ref Range   Acetaminophen (Tylenol), Serum <10 (L) 10 - 30 ug/mL  cbc  Result Value Ref Range   WBC 3.3 (L) 4.0 - 10.5 K/uL   RBC 4.08 (L) 4.22 - 5.81 MIL/uL   Hemoglobin 10.5 (L) 13.0 - 17.0 g/dL   HCT  69.6 (L) 78.9 - 52.0 %   MCV 85.0 80.0 - 100.0 fL   MCH 25.7 (L) 26.0 - 34.0 pg   MCHC 30.3 30.0 - 36.0 g/dL   RDW 38.1 (H) 01.7 - 51.0 %   Platelets 282 150 - 400 K/uL   nRBC 0.0 0.0 - 0.2 %  Rapid urine drug screen (hospital performed)  Result Value Ref Range  Opiates NONE DETECTED NONE DETECTED   Cocaine POSITIVE (A) NONE DETECTED   Benzodiazepines NONE DETECTED NONE DETECTED   Amphetamines NONE DETECTED NONE DETECTED   Tetrahydrocannabinol POSITIVE (A) NONE DETECTED   Barbiturates NONE DETECTED NONE DETECTED     EKG None  Radiology No results found.  Procedures Procedures (including critical care time)  Medications Ordered in ED Medications - No data to display  ED Course  I have reviewed the triage vital signs and the nursing notes.  Pertinent labs & imaging results that were available during my care of the patient were reviewed by me and considered in my medical decision making (see chart for details).    MDM Rules/Calculators/A&P                      Patient will be reevaluated by behavioral health.  Patient's labs medically cleared.    Final Clinical Impression(s) / ED Diagnoses Final diagnoses:  Suicidal ideation    Rx / DC Orders ED Discharge Orders    None       Fredia Sorrow, MD 01/26/20 [phone]

## 2020-01-25 NOTE — ED Provider Notes (Signed)
[facility] EMERGENCY DEPARTMENT Provider Note   CSN: [id number] Arrival date & time: 01/25/20  0324   Time seen 4:25 AM  History Chief Complaint  Patient presents with  . Medical Clearance    rehabilitation from drugs    Nathaniel Hansen is a 34 y.o. male.  HPI Patient states he has been abusing drugs since 2010.  He states currently he is using heroin, cocaine and sometimes meth.  He states he was sober in December and January but he relapsed in February.  His last detox was about a year ago.  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  He did finish his antibiotic treatment for that.  Patient states he feels like he is depressed and sometimes he feels suicidal but not always and not right now.  He also has some homicidal thoughts at times, but not now.  He states he has been diagnosed with depression but he is on no medications for it.  He also states he has really bad anxiety and when he was admitted to the hospital he was treated with something that he quit taking it about a month ago.  He states it was working well.  He states he used to drink a lot however he did quit that and he states it was easier to quit alcohol than the cocaine and heroin.  He states he currently lives with his parents.  PCP Patient, No Pcp Per     Past Medical History:  Diagnosis Date  . Asthma   . Cocaine abuse (HCC)   . Endocarditis   . Opioid abuse [facility])     Patient Active Problem List   Diagnosis Date Noted  . Polysubstance abuse (HCC) 11/08/2019  . Streptococcal sepsis, unspecified (HCC) 10/27/2019  . Opioid dependence (HCC) 10/23/2019  . Encounter for monitoring Suboxone maintenance therapy 10/23/2019  . Leukocytosis 10/23/2019  . Fever, unspecified 10/23/2019  . 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 Streptococcal Mitis/Oralis Bacteremia 10/08/2019  . Septic shock (HCC) 10/07/2019  . CAP (community acquired pneumonia) 10/07/2019  . Anemia 10/07/2019  . Lactic acidosis 10/07/2019  . Chronic back pain 10/07/2019    History reviewed. No pertinent surgical history.     No family history on file.  Social History   Tobacco Use  . Smoking status: Current Every Day Smoker    Packs/day: 1.00    Types: Cigarettes  . Smokeless tobacco: Never Used  Substance Use Topics  . Alcohol use: Yes    Comment: sometimes  . Drug use: Yes    Types: Marijuana, Heroin    Comment: "sometimes" opioids, heroin   lives with parents Unemployed since July  Home Medications Prior to Admission medications   Medication Sig Start Date End Date Taking? Authorizing Provider  gabapentin (NEURONTIN) 400 MG capsule Take 2 capsules (800 mg total) by mouth 3 (three) times daily. Patient taking differently: Take 800 mg by mouth 3 (three) times daily as needed (neuropathy).  10/30/19 11/29/19  Cleora Fleet, MD  hydrOXYzine (ATARAX/VISTARIL) 50 MG tablet Take 1 tablet (50 mg total) by mouth every 8 (eight) hours as needed for itching. 10/30/19   Johnson, Clanford L, MD  methocarbamol (ROBAXIN) 500 MG tablet Take 1 tablet (500 mg total) by mouth every 8 (eight) hours as needed  for muscle spasms. 10/30/19   Johnson, Clanford L, MD  nicotine (NICODERM CQ - DOSED IN MG/24 HOURS) 21 mg/24hr patch Place 1 patch (21 mg total) onto the skin daily. 10/31/19   Johnson, Clanford L, MD  potassium chloride SA (KLOR-CON) 20 MEQ tablet Take 1 tablet (20 mEq total) by mouth 2 (two) times daily. 01/23/20   Rancour, Jeannett Senior, MD  traZODone (DESYREL) 150 MG tablet Take 1 tablet (150 mg total) by mouth at bedtime as needed for sleep. 10/30/19 11/29/19  Cleora Fleet, MD  Patient denies taking any medication at this time  Allergies    Patient has no known  allergies.  Review of Systems   Review of Systems  All other systems reviewed and are negative.   Physical Exam Updated Vital Signs BP 120/61   Pulse 75   Temp 97.6 F (36.4 C) (Oral)   Resp 18   Ht 6' (1.829 m)   Wt 75.8 kg   SpO2 100%   BMI 22.65 kg/m   Physical Exam Vitals and nursing note reviewed.  Constitutional:      General: He is not in acute distress.    Appearance: Normal appearance. He is well-developed. He is not ill-appearing or toxic-appearing.  HENT:     Head: Normocephalic and atraumatic.     Right Ear: External ear normal.     Left Ear: External ear normal.     Nose: Nose normal. No mucosal edema or rhinorrhea.     Mouth/Throat:     Dentition: No dental abscesses.     Pharynx: No uvula swelling.  Eyes:     Extraocular Movements: Extraocular movements intact.     Conjunctiva/sclera: Conjunctivae normal.     Pupils: Pupils are equal, round, and reactive to light.     Comments: No conjunctival hemorrhages  Cardiovascular:     Rate and Rhythm: Normal rate and regular rhythm.     Heart sounds: Murmur present. Systolic murmur present with a grade of 2/6. No friction rub. No gallop.   Pulmonary:     Effort: Pulmonary effort is normal. No respiratory distress.     Breath sounds: Normal breath sounds. No wheezing, rhonchi or rales.  Chest:     Chest wall: No tenderness or crepitus.  Abdominal:     General: Bowel sounds are normal. There is no distension.     Palpations: Abdomen is soft.     Tenderness: There is no abdominal tenderness. There is no guarding or rebound.  Musculoskeletal:        General: No tenderness. Normal range of motion.     Cervical back: Full passive range of motion without pain, normal range of motion and neck supple.     Comments: Moves all extremities well.   Skin:    General: Skin is warm and dry.     Capillary Refill: Capillary refill takes less than 2 seconds.     Coloration: Skin is not pale.     Findings: No erythema or  rash.     Comments: Patient is noted to have multiple needle tracks on both forearms, left more than the right.  He has more bruising on the left.  There is no areas that look infected.  Neurological:     General: No focal deficit present.     Mental Status: He is alert and oriented to person, place, and time.     Cranial Nerves: No cranial nerve deficit.  Psychiatric:        Mood  and Affect: Mood normal.        Speech: Speech normal.        Behavior: Behavior normal. Behavior is cooperative.     ED Results / Procedures / Treatments   Labs (all labs ordered are listed, but only abnormal results are displayed) Results for orders placed or performed during the hospital encounter of 01/25/20  Urinalysis, Routine w reflex microscopic  Result Value Ref Range   Color, Urine YELLOW YELLOW   APPearance CLEAR CLEAR   Specific Gravity, Urine 1.017 1.005 - 1.030   pH 5.0 5.0 - 8.0   Glucose, UA NEGATIVE NEGATIVE mg/dL   Hgb urine dipstick NEGATIVE NEGATIVE   Bilirubin Urine NEGATIVE NEGATIVE   Ketones, ur NEGATIVE NEGATIVE mg/dL   Protein, ur NEGATIVE NEGATIVE mg/dL   Nitrite NEGATIVE NEGATIVE   Leukocytes,Ua NEGATIVE NEGATIVE  Rapid urine drug screen (hospital performed)  Result Value Ref Range   Opiates NONE DETECTED NONE DETECTED   Cocaine POSITIVE (A) NONE DETECTED   Benzodiazepines NONE DETECTED NONE DETECTED   Amphetamines NONE DETECTED NONE DETECTED   Tetrahydrocannabinol POSITIVE (A) NONE DETECTED   Barbiturates NONE DETECTED NONE DETECTED  Comprehensive metabolic panel  Result Value Ref Range   Sodium 137 135 - 145 mmol/L   Potassium 3.5 3.5 - 5.1 mmol/L   Chloride 104 98 - 111 mmol/L   CO2 25 22 - 32 mmol/L   Glucose, Bld 126 (H) 70 - 99 mg/dL   BUN 9 6 - 20 mg/dL   Creatinine, Ser 2.48 0.61 - 1.24 mg/dL   Calcium 8.7 (L) 8.9 - 10.3 mg/dL   Total Protein 7.6 6.5 - 8.1 g/dL   Albumin 3.9 3.5 - 5.0 g/dL   AST 23 15 - 41 U/L   ALT 13 0 - 44 U/L   Alkaline Phosphatase  50 38 - 126 U/L   Total Bilirubin 0.4 0.3 - 1.2 mg/dL   GFR calc non Af Amer >60 >60 mL/min   GFR calc Af Amer >60 >60 mL/min   Anion gap 8 5 - 15  Ethanol  Result Value Ref Range   Alcohol, Ethyl (B) <10 <10 mg/dL  Salicylate level  Result Value Ref Range   Salicylate Lvl <7.0 (L) 7.0 - 30.0 mg/dL  Acetaminophen level  Result Value Ref Range   Acetaminophen (Tylenol), Serum <10 (L) 10 - 30 ug/mL  cbc  Result Value Ref Range   WBC 4.4 4.0 - 10.5 K/uL   RBC 3.88 (L) 4.22 - 5.81 MIL/uL   Hemoglobin 10.0 (L) 13.0 - 17.0 g/dL   HCT 25.0 (L) 03.7 - 04.8 %   MCV 83.5 80.0 - 100.0 fL   MCH 25.8 (L) 26.0 - 34.0 pg   MCHC 30.9 30.0 - 36.0 g/dL   RDW 88.9 (H) 16.9 - 45.0 %   Platelets 266 150 - 400 K/uL   nRBC 0.0 0.0 - 0.2 %   Laboratory interpretation all normal except positive UDS, stable anemia,    EKG None  Radiology No results found.    DG Chest 2 View  Result Date: 01/22/2020 CLINICAL DATA:  Chest pain and shortness of breath IMPRESSION: No acute abnormality noted. Electronically Signed   By: Alcide Clever M.D.   On: 01/22/2020 23:29   Procedures Procedures (including critical care time)  Medications Ordered in ED Medications - No data to display  ED Course  I have reviewed the triage vital signs and the nursing notes.  Pertinent labs & imaging results that  were available during my care of the patient were reviewed by me and considered in my medical decision making (see chart for details).    MDM Rules/Calculators/A&P                      Patient was evaluated by Nadean Corwin, TTS, they recommend inpatient placement.  Covid testing was done.  Psych holding orders were done.   Final Clinical Impression(s) / ED Diagnoses Final diagnoses:  IV drug abuse ([place])  Anxiety  Depression, unspecified depression type  Suicidal ideation    Rx / DC Orders  Plan inpatient psychiatric admission  Rolland Porter, MD, Barbette Or, MD 01/25/20 [phone]

## 2020-01-25 NOTE — ED Notes (Signed)
Pt wanded by security at this time  ?

## 2020-01-25 NOTE — Consult Note (Signed)
Telepsych Consultation   Reason for Consult:  Substance abuse  Referring Physician:  EPD Location of Patient: [facility] Location of Provider: [facility]  Patient Identification: Nathaniel Hansen MRN:  [id number] Principal Diagnosis: <principal problem not specified> Diagnosis:  Active Problems:   * No active hospital problems. *   Total Time spent with patient: 15 minutes  Subjective:   Nathaniel Hansen is a 34 y.o. male was evaluated via teleassessment.  Patient is requesting emergency rehabilitation facility for cocaine and heroin use/abuse.  Reported previous inpatient admission for anxiety and depression last January.  Denied that he followed up with psychiatrist and/or therapy at that time.  Does report depression.  Denies suicidal or homicidal ideations.  Reports worsening depression with substance abuse use.  States he has been using for the past 8 years.  Patient reports he resides with his parents and is unemployed.  Was asked to his depression.  Education provided with substance abuse treatment facility.  Patient was receptive to plan.  CSW to fax over outpatient resources.  Support, encouragement and  reassurance was provided.  HPI:  Per admission assessment note: Nathaniel Hansen is an 34 y.o. male who presents to the ED voluntarily. Pt reports he came to the ED because he wants help getting off of drugs. Pt reports he has been using drugs for the past 10 years and he feels that he is ready for help. Pt states he has been to a detox facility in the past and stayed sober for 1 year. Pt endorses SI, with no plan. Pt identifies his primary stressor as abusing drugs. Pt states he has been staying with his parents but he is homeless and he feels that he is a burden on others and that his parents do not really want him there. Pt denies HI at present but admits to thoughts of harming "people who want to harm me." Pt denies AVH at present but admits to previous [facility]  whenever he is abusing drugs. Pt states he has lost friends and family members support due to his drug use and he feels helpless at this time. TTS asked the pt for consent to speak with collaterals and he declines stating he does not wish for TTS to contact anyone. Pt denies any prior inpt or psych hx. Pt reports sleep changes in which some days he will sleep all night and others he will only sleep for 4 hours.  Past Psychiatric History:   Risk to Self: Suicidal Ideation: Yes-Currently Present Suicidal Intent: No Is patient at risk for suicide?: Yes Suicidal Plan?: No Access to Means: No What has been your use of drugs/alcohol within the last 12 months?: cocaine, heroin Other Self Harm Risks: drug abuse, hopelessness Triggers for Past Attempts: None known Intentional Self Injurious Behavior: None Risk to Others: Homicidal Ideation: No Thoughts of Harm to Others: No Current Homicidal Intent: No Current Homicidal Plan: No Access to Homicidal Means: No History of harm to others?: No Assessment of Violence: None Noted Does patient have access to weapons?: No Criminal Charges Pending?: No Does patient have a court date: No Prior Inpatient Therapy: Prior Inpatient Therapy: No Prior Outpatient Therapy: Prior Outpatient Therapy: No Does patient have an ACCT team?: No Does patient have Intensive In-House Services?  : No Does patient have [facility] services? : No Does patient have P4CC services?: No  Past Medical History:  Past Medical History:  Diagnosis Date  . Asthma   . Cocaine abuse (HCC)   .  Endocarditis   . Opioid abuse ([place])    History reviewed. No pertinent surgical history. Family History: No family history on file. Family Psychiatric  History:  Social History:  Social History   Substance and Sexual Activity  Alcohol Use Yes   Comment: sometimes     Social History   Substance and Sexual Activity  Drug Use Yes  . Types: Marijuana, Heroin   Comment: "sometimes"  opioids, heroin     Social History   Socioeconomic History  . Marital status: Single    Spouse name: Not on file  . Number of children: Not on file  . Years of education: Not on file  . Highest education level: Not on file  Occupational History  . Not on file  Tobacco Use  . Smoking status: Current Every Day Smoker    Packs/day: 1.00    Types: Cigarettes  . Smokeless tobacco: Never Used  Substance and Sexual Activity  . Alcohol use: Yes    Comment: sometimes  . Drug use: Yes    Types: Marijuana, Heroin    Comment: "sometimes" opioids, heroin   . Sexual activity: Not on file  Other Topics Concern  . Not on file  Social History Narrative  . Not on file   Social Determinants of Health   Financial Resource Strain:   . Difficulty of Paying Living Expenses: Not on file  Food Insecurity:   . Worried About Charity fundraiser in the Last Year: Not on file  . Ran Out of Food in the Last Year: Not on file  Transportation Needs:   . Lack of Transportation (Medical): Not on file  . Lack of Transportation (Non-Medical): Not on file  Physical Activity:   . Days of Exercise per Week: Not on file  . Minutes of Exercise per Session: Not on file  Stress:   . Feeling of Stress : Not on file  Social Connections:   . Frequency of Communication with Friends and Family: Not on file  . Frequency of Social Gatherings with Friends and Family: Not on file  . Attends Religious Services: Not on file  . Active Member of Clubs or Organizations: Not on file  . Attends Archivist Meetings: Not on file  . Marital Status: Not on file   Additional Social History:    Allergies:  No Known Allergies  Labs:  Results for orders placed or performed during the hospital encounter of 01/25/20 (from the past 48 hour(s))  Urinalysis, Routine w reflex microscopic     Status: None   Collection Time: 01/25/20  4:10 AM  Result Value Ref Range   Color, Urine YELLOW YELLOW   APPearance CLEAR  CLEAR   Specific Gravity, Urine 1.017 1.005 - 1.030   pH 5.0 5.0 - 8.0   Glucose, UA NEGATIVE NEGATIVE mg/dL   Hgb urine dipstick NEGATIVE NEGATIVE   Bilirubin Urine NEGATIVE NEGATIVE   Ketones, ur NEGATIVE NEGATIVE mg/dL   Protein, ur NEGATIVE NEGATIVE mg/dL   Nitrite NEGATIVE NEGATIVE   Leukocytes,Ua NEGATIVE NEGATIVE    Comment: Performed at [facility], [address] [place], [place] [address]  Rapid urine drug screen (hospital performed)     Status: Abnormal   Collection Time: 01/25/20  4:10 AM  Result Value Ref Range   Opiates NONE DETECTED NONE DETECTED   Cocaine POSITIVE (A) NONE DETECTED   Benzodiazepines NONE DETECTED NONE DETECTED   Amphetamines NONE DETECTED NONE DETECTED   Tetrahydrocannabinol POSITIVE (A) NONE DETECTED  Barbiturates NONE DETECTED NONE DETECTED    Comment: (NOTE) DRUG SCREEN FOR MEDICAL PURPOSES ONLY.  IF CONFIRMATION IS NEEDED FOR ANY PURPOSE, NOTIFY LAB WITHIN 5 DAYS. LOWEST DETECTABLE LIMITS FOR URINE DRUG SCREEN Drug Class                     Cutoff (ng/mL) Amphetamine and metabolites    1000 Barbiturate and metabolites    200 Benzodiazepine                 200 Tricyclics and metabolites     300 Opiates and metabolites        300 Cocaine and metabolites        300 THC                            50 Performed at [facility], [address] [place], [place] [address]   Comprehensive metabolic panel     Status: Abnormal   Collection Time: 01/25/20  5:43 AM  Result Value Ref Range   Sodium 137 135 - 145 mmol/L   Potassium 3.5 3.5 - 5.1 mmol/L   Chloride 104 98 - 111 mmol/L   CO2 25 22 - 32 mmol/L   Glucose, Bld 126 (H) 70 - 99 mg/dL    Comment: Glucose reference range applies only to samples taken after fasting for at least 8 hours.   BUN 9 6 - 20 mg/dL   Creatinine, Ser 3.29 0.61 - 1.24 mg/dL   Calcium 8.7 (L) 8.9 - 10.3 mg/dL   Total Protein 7.6 6.5 - 8.1 g/dL   Albumin 3.9 3.5 - 5.0 g/dL   AST 23 15 - 41 U/L   ALT 13 0 - 44 U/L    Alkaline Phosphatase 50 38 - 126 U/L   Total Bilirubin 0.4 0.3 - 1.2 mg/dL   GFR calc non Af Amer >60 >60 mL/min   GFR calc Af Amer >60 >60 mL/min   Anion gap 8 5 - 15    Comment: Performed at [facility], [address], [place], [place] [address]  Ethanol     Status: None   Collection Time: 01/25/20  5:43 AM  Result Value Ref Range   Alcohol, Ethyl (B) <10 <10 mg/dL    Comment: (NOTE) Lowest detectable limit for serum alcohol is 10 mg/dL. For medical purposes only. Performed at [facility], [address] [place], [place] [address]   Salicylate level     Status: Abnormal   Collection Time: 01/25/20  5:43 AM  Result Value Ref Range   Salicylate Lvl <7.0 (L) 7.0 - 30.0 mg/dL    Comment: Performed at [facility], [address] [place], [place] [address]  Acetaminophen level     Status: Abnormal   Collection Time: 01/25/20  5:43 AM  Result Value Ref Range   Acetaminophen (Tylenol), Serum <10 (L) 10 - 30 ug/mL    Comment: (NOTE) Therapeutic concentrations vary significantly. A range of 10-30 ug/mL  may be an effective concentration for many patients. However, some  are best treated at concentrations outside of this range. Acetaminophen concentrations >150 ug/mL at 4 hours after ingestion  and >50 ug/mL at 12 hours after ingestion are often associated with  toxic reactions. Performed at [facility], [address], [place], [place] [address]   cbc     Status: Abnormal   Collection Time: 01/25/20  5:43 AM  Result Value Ref Range  WBC 4.4 4.0 - 10.5 K/uL   RBC 3.88 (L) 4.22 - 5.81 MIL/uL   Hemoglobin 10.0 (L) 13.0 - 17.0 g/dL   HCT 50.2 (L) 77.4 - 12.8 %   MCV 83.5 80.0 - 100.0 fL   MCH 25.8 (L) 26.0 - 34.0 pg   MCHC 30.9 30.0 - 36.0 g/dL   RDW 78.6 (H) 76.7 - 20.9 %   Platelets 266 150 - 400 K/uL   nRBC 0.0 0.0 - 0.2 %    Comment: Performed at [facility], [address] [place], [place] [address]    Medications:  No current facility-administered medications for  this encounter.   Current Outpatient Medications  Medication Sig Dispense Refill  . buprenorphine (SUBUTEX) 2 MG SUBL SL tablet Place under the tongue daily.    . methocarbamol (ROBAXIN) 500 MG tablet Take 1 tablet (500 mg total) by mouth every 8 (eight) hours as needed for muscle spasms. (Patient not taking: Reported on 01/25/2020) 30 tablet 0    Musculoskeletal:   Psychiatric Specialty Exam: Physical Exam  Constitutional: He appears well-developed.    Review of Systems  Psychiatric/Behavioral: Positive for sleep disturbance. Negative for hallucinations and suicidal ideas.    Blood pressure 120/61, pulse 75, temperature 97.6 F (36.4 C), temperature source Oral, resp. rate 18, height 6' (1.829 m), weight 75.8 kg, SpO2 100 %.Body mass index is 22.65 kg/m.  General Appearance: Casual  Eye Contact:  Good  Speech:  Clear and Coherent  Volume:  Normal  Mood:  Anxious and Depressed  Affect:  Congruent  Thought Process:  Coherent  Orientation:  Full (Time, Place, and Person)  Thought Content:  Logical  Suicidal Thoughts:  No  Homicidal Thoughts:  No  Memory:  Immediate;   Fair Recent;   Fair  Judgement:  Fair  Insight:  Fair  Psychomotor Activity:  Normal  Concentration:  Concentration: Fair  Recall:  [employer] of Knowledge:  Fair  Language:  Fair  Akathisia:  No  Handed:  Right  AIMS (if indicated):     Assets:  Communication Skills Desire for Improvement Resilience Social Support  ADL's:  Intact  Cognition:  WNL  Sleep:        Disposition: No evidence of imminent risk to self or others at present.   Patient does not meet criteria for psychiatric inpatient admission. Supportive therapy provided about ongoing stressors. Refer to IOP. Discussed crisis plan, support from social network, calling 911, coming to the Emergency Department, and calling Suicide Hotline.  This service was provided via telemedicine using a 2-way, interactive audio and video  technology.  Names of all persons participating in this telemedicine service and their role in this encounter. Name: Nathaniel Hansen Role: Patient   Name: T.Arman Loy Role: NP          Oneta Rack, NP 01/25/2020 1:14 PM

## 2020-01-26 LAB — RESPIRATORY PANEL BY RT PCR (FLU A&B, COVID)
Influenza A by PCR: NEGATIVE
Influenza B by PCR: NEGATIVE
SARS Coronavirus 2 by RT PCR: NEGATIVE

## 2020-01-26 MED ORDER — ONDANSETRON 4 MG PO TBDP: 4.0000 mg | ORAL_TABLET | Freq: Four times a day (QID) | ORAL | Status: DC | PRN

## 2020-01-26 MED ORDER — CLONIDINE HCL 0.1 MG PO TABS: 0.1000 mg | ORAL_TABLET | ORAL | Status: DC

## 2020-01-26 MED ORDER — CLONIDINE HCL 0.1 MG PO TABS: 0.1000 mg | ORAL_TABLET | Freq: Every day | ORAL | Status: DC

## 2020-01-26 MED ORDER — CLONIDINE HCL 0.1 MG PO TABS
0.1000 mg | ORAL_TABLET | Freq: Four times a day (QID) | ORAL | Status: DC
  Administered 2020-01-26 (×2): 0.1 mg via ORAL
  Filled 2020-01-26 (×2): qty 1

## 2020-01-26 MED ORDER — HYDROXYZINE HCL 25 MG PO TABS: 50.0000 mg | ORAL_TABLET | Freq: Four times a day (QID) | ORAL | Status: DC | PRN

## 2020-01-26 MED ORDER — METHOCARBAMOL 500 MG PO TABS: 1000.0000 mg | ORAL_TABLET | Freq: Three times a day (TID) | ORAL | Status: DC | PRN

## 2020-01-26 MED ORDER — DICYCLOMINE HCL 20 MG PO TABS
20.0000 mg | ORAL_TABLET | Freq: Four times a day (QID) | ORAL | Status: DC | PRN
  Filled 2020-01-26: qty 1

## 2020-01-26 MED ORDER — NAPROXEN 250 MG PO TABS: 500.0000 mg | ORAL_TABLET | Freq: Two times a day (BID) | ORAL | Status: DC | PRN

## 2020-01-26 MED ORDER — LOPERAMIDE HCL 2 MG PO CAPS: 2.0000 mg | ORAL_CAPSULE | ORAL | Status: DC | PRN

## 2020-01-26 NOTE — ED Provider Notes (Signed)
Patient has been accepted to [facility] to their behavioral health unit by Dr. Waymond Cera.  Patient is sleeping however he is easily awakened.  He is agreeable to the transfer.  Vitals with BMI 01/26/2020 01/26/2020 01/26/2020  Height - - -  Weight - - -  BMI - - -  Systolic 110 104 174  Diastolic 70 65 56  Pulse 70 67 Devoria Albe, MD 01/26/20 2359

## 2020-01-26 NOTE — ED Notes (Signed)
Voluntary form faxed.  [facility] called

## 2020-01-26 NOTE — ED Notes (Signed)
Pt given dinner tray and beverage per request. Pt ate 100%

## 2020-01-26 NOTE — Progress Notes (Signed)
Patient meets criteria for inpatient treatment per Nira Conn, NP. No appropriate beds at [facility] currently. CSW faxed referrals to the following facilities for review:  [facility]  [facility]   [facility]   [facility]  [facility]  [facility]   [facility]  [facility]   [facility]   [facility]  [facility]   [facility]  [facility]    TTS will continue to seek bed placement.     Ruthann Cancer MSW, [facility] Clincal Social Worker Disposition  [facility] Ph: [phone] Fax: [phone] 01/26/2020 2:29 PM
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
## 2020-01-27 ENCOUNTER — Inpatient Hospital Stay
Admission: RE | Admit: 2020-01-27 | Discharge: 2020-02-03 | DRG: 885 | Disposition: A | Discharge status: Home / Self Care | Payer: No Typology Code available for payment source | Source: Intra-hospital | Attending: Psychiatry | Admitting: Psychiatry

## 2020-01-27 ENCOUNTER — Encounter: Payer: Self-pay | Admitting: Family

## 2020-01-27 ENCOUNTER — Other Ambulatory Visit: Payer: Self-pay

## 2020-01-27 DIAGNOSIS — F1410 Cocaine abuse, uncomplicated: Secondary | ICD-10-CM | POA: Diagnosis present

## 2020-01-27 DIAGNOSIS — R45851 Suicidal ideations: Secondary | ICD-10-CM | POA: Diagnosis present

## 2020-01-27 DIAGNOSIS — J45909 Unspecified asthma, uncomplicated: Secondary | ICD-10-CM | POA: Diagnosis present

## 2020-01-27 DIAGNOSIS — G4700 Insomnia, unspecified: Secondary | ICD-10-CM | POA: Diagnosis present

## 2020-01-27 DIAGNOSIS — F17210 Nicotine dependence, cigarettes, uncomplicated: Secondary | ICD-10-CM | POA: Diagnosis present

## 2020-01-27 DIAGNOSIS — F332 Major depressive disorder, recurrent severe without psychotic features: Secondary | ICD-10-CM | POA: Diagnosis present

## 2020-01-27 DIAGNOSIS — F1910 Other psychoactive substance abuse, uncomplicated: Secondary | ICD-10-CM | POA: Diagnosis present

## 2020-01-27 DIAGNOSIS — F1120 Opioid dependence, uncomplicated: Secondary | ICD-10-CM | POA: Diagnosis present

## 2020-01-27 DIAGNOSIS — F419 Anxiety disorder, unspecified: Secondary | ICD-10-CM | POA: Diagnosis present

## 2020-01-27 DIAGNOSIS — Z79899 Other long term (current) drug therapy: Secondary | ICD-10-CM | POA: Diagnosis not present

## 2020-01-27 DIAGNOSIS — F14180 Cocaine abuse with cocaine-induced anxiety disorder: Secondary | ICD-10-CM

## 2020-01-27 MED ORDER — BUPRENORPHINE HCL-NALOXONE HCL 2-0.5 MG SL SUBL
1.0000 | SUBLINGUAL_TABLET | Freq: Every day | SUBLINGUAL | Status: DC
  Administered 2020-01-27 – 2020-02-03 (×8): 1 via SUBLINGUAL
  Filled 2020-01-27 (×9): qty 1

## 2020-01-27 MED ORDER — ALUM & MAG HYDROXIDE-SIMETH 200-200-20 MG/5ML PO SUSP: 30.0000 mL | ORAL | Status: DC | PRN

## 2020-01-27 MED ORDER — TRAZODONE HCL 50 MG PO TABS
50.0000 mg | ORAL_TABLET | Freq: Every evening | ORAL | Status: DC | PRN
  Administered 2020-01-29 – 2020-02-02 (×5): 50 mg via ORAL
  Filled 2020-01-27 (×7): qty 1

## 2020-01-27 MED ORDER — ACETAMINOPHEN 325 MG PO TABS: 650.0000 mg | ORAL_TABLET | Freq: Four times a day (QID) | ORAL | Status: DC | PRN

## 2020-01-27 MED ORDER — CITALOPRAM HYDROBROMIDE 20 MG PO TABS
20.0000 mg | ORAL_TABLET | Freq: Every day | ORAL | Status: DC
  Administered 2020-01-27 – 2020-02-03 (×8): 20 mg via ORAL
  Filled 2020-01-27 (×8): qty 1

## 2020-01-27 MED ORDER — HYDROXYZINE HCL 25 MG PO TABS
25.0000 mg | ORAL_TABLET | Freq: Three times a day (TID) | ORAL | Status: DC | PRN
  Administered 2020-01-28 – 2020-02-02 (×6): 25 mg via ORAL
  Filled 2020-01-27 (×6): qty 1

## 2020-01-27 MED ORDER — MAGNESIUM HYDROXIDE 400 MG/5ML PO SUSP: 30.0000 mL | Freq: Every day | ORAL | Status: DC | PRN

## 2020-01-27 MED ORDER — BUPRENORPHINE HCL-NALOXONE HCL 8-2 MG SL SUBL
2.0000 | SUBLINGUAL_TABLET | Freq: Every day | SUBLINGUAL | Status: DC
  Administered 2020-01-27 – 2020-02-03 (×8): 2 via SUBLINGUAL
  Filled 2020-01-27 (×8): qty 2

## 2020-01-27 NOTE — H&P (Signed)
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  34 year old man presented to the hospital in [place] with complaints of anxiety and depression with suicidal ideation.  Patient has been using intravenous cocaine regularly.  Also has a history of opiate abuse.  Mood has been increasingly bad for weeks.  He has been having some suicidal thoughts without specific plan.  Denies any hallucinations.  Used to be followed up at [facility] in the past but has not been following up with any outpatient treatment now.  He has occasional visual hallucinations when he is using cocaine.  Denies homicidal ideation. Associated Signs/Symptoms: Depression Symptoms:  depressed mood, anhedonia, insomnia, psychomotor retardation, feelings of worthlessness/guilt, difficulty concentrating, (Hypo) Manic Symptoms:  Impulsivity, Anxiety Symptoms:  Excessive Worry, Psychotic Symptoms:  Hallucinations: Visual Paranoia, PTSD Symptoms: Negative Total Time spent with patient: 1 hour  Past Psychiatric History: Patient has a past history of substance abuse.  Currently involved in outpatient opiate dependence treatment with Suboxone.  No known previous hospitalizations.  Unclear if he has ever been on any other psychiatric medicine  Is the patient at risk to self? Yes.    Has the patient been a risk to self in the past 6 months? Yes.    Has the patient been a risk to self within the distant past? Yes.    Is the  patient a risk to others? No.  Has the patient been a risk to others in the past 6 months? No.  Has the patient been a risk to others within the distant past? No.   Prior Inpatient Therapy:   Prior Outpatient Therapy:    Alcohol Screening: 1. How often do you have a drink containing alcohol?: Never 2. How many drinks containing alcohol do you have on a typical day when you are drinking?: 1 or 2 3. How often do you have six or more drinks on one occasion?: Never AUDIT-C Score: 0 4. How often during the last year have you found that you were not able to stop drinking once you had started?: Never 5. How often during the last year have you failed to do what was normally expected from you becasue of drinking?: Never 6. How often during the last year have you needed a first drink in the morning to get yourself going after a heavy drinking session?: Never 7. How often during the last year have you had a feeling of guilt of remorse after drinking?: Never 8. How often during the last year have you been unable to remember what happened the night before because you had been drinking?: Never 9. Have you or someone else been injured as a result of your drinking?: No 10. Has a relative or friend or a doctor or another health worker been concerned about your drinking or suggested you cut down?: No Alcohol Use Disorder Identification Test Final Score (AUDIT): 0 Alcohol Brief Interventions/Follow-up: AUDIT Score <7 follow-up not indicated Substance Abuse History in the last 12 months:  Yes.   Consequences of Substance Abuse: Medical Consequences:  Significant medical problems from intravenous use Previous Psychotropic Medications:  No  Psychological Evaluations: No  Past Medical History:  Past Medical History:  Diagnosis Date  . Anxiety   . Asthma   . Cocaine abuse (HCC)   . Endocarditis   . Opioid abuse (HCC)    History reviewed. No pertinent surgical history. Family History: History reviewed. No  pertinent family history. Family Psychiatric  History: See previous.  Patient not aware of any Tobacco Screening: Have you used any form of tobacco in the last 30 days? (Cigarettes, Smokeless Tobacco, Cigars, and/or Pipes): Yes Tobacco use, Select all that apply: 5 or more cigarettes per day Are you interested in Tobacco Cessation Medications?: No, patient refused Counseled patient on smoking cessation including recognizing danger situations, developing coping skills and basic information about quitting provided: Refused/Declined practical counseling Social History:  Social History   Substance and Sexual Activity  Alcohol Use Yes   Comment: sometimes     Social History   Substance and Sexual Activity  Drug Use Yes  . Types: Marijuana, Heroin   Comment: "sometimes" opioids, heroin     Additional Social History: Marital status: Single Are you sexually active?: Yes What is your sexual orientation?: Heterosexual Has your sexual activity been affected by drugs, alcohol, medication, or emotional stress?: Pt denies. Does patient have children?: Yes How many children?: 1 How is patient's relationship with their children?: Pt denies.                         Allergies:  No Known Allergies Lab Results:  Results for orders placed or performed during the hospital encounter of 01/25/20 (from the past 48 hour(s))  Rapid urine drug screen (hospital performed)     Status: Abnormal   Collection Time: 01/25/20  9:46 PM  Result Value Ref Range   Opiates NONE DETECTED NONE DETECTED   Cocaine POSITIVE (A) NONE DETECTED   Benzodiazepines NONE DETECTED NONE DETECTED   Amphetamines NONE DETECTED NONE DETECTED   Tetrahydrocannabinol POSITIVE (A) NONE DETECTED   Barbiturates NONE DETECTED NONE DETECTED    Comment: (NOTE) DRUG SCREEN FOR MEDICAL PURPOSES ONLY.  IF CONFIRMATION IS NEEDED FOR ANY PURPOSE, NOTIFY LAB WITHIN 5 DAYS. LOWEST DETECTABLE LIMITS FOR URINE DRUG SCREEN Drug Class                      Cutoff (ng/mL) Amphetamine and metabolites    1000 Barbiturate and metabolites    200 Benzodiazepine                 200 Tricyclics and metabolites     300 Opiates and metabolites        300 Cocaine and metabolites        300 THC                            50 Performed at [facility], [address] [place], [place] [address]   Comprehensive metabolic panel     Status: None   Collection Time: 01/25/20 10:29 PM  Result Value Ref Range   Sodium 137 135 - 145 mmol/L   Potassium 3.7 3.5 - 5.1 mmol/L   Chloride 103 98 - 111 mmol/L   CO2 26 22 - 32 mmol/L   Glucose, Bld 83 70 - 99 mg/dL    Comment: Glucose reference range applies only to samples taken after fasting for at least 8 hours.   BUN 9 6 - 20 mg/dL  Creatinine, Ser 1.21 0.61 - 1.24 mg/dL   Calcium 9.1 8.9 - 84.1 mg/dL   Total Protein 8.0 6.5 - 8.1 g/dL   Albumin 4.1 3.5 - 5.0 g/dL   AST 22 15 - 41 U/L   ALT 13 0 - 44 U/L   Alkaline Phosphatase 52 38 - 126 U/L   Total Bilirubin 0.6 0.3 - 1.2 mg/dL   GFR calc non Af Amer >60 >60 mL/min   GFR calc Af Amer >60 >60 mL/min   Anion gap 8 5 - 15    Comment: Performed at [facility], [address], [place], [place] [address]  Ethanol     Status: None   Collection Time: 01/25/20 10:29 PM  Result Value Ref Range   Alcohol, Ethyl (B) <10 <10 mg/dL    Comment: (NOTE) Lowest detectable limit for serum alcohol is 10 mg/dL. For medical purposes only. Performed at [facility], [address] [place], [place] [address]   Salicylate level     Status: Abnormal   Collection Time: 01/25/20 10:29 PM  Result Value Ref Range   Salicylate Lvl <7.0 (L) 7.0 - 30.0 mg/dL    Comment: Performed at [facility], [address] [place], [place] [address]  Acetaminophen level     Status: Abnormal   Collection Time: 01/25/20 10:29 PM  Result Value Ref Range   Acetaminophen (Tylenol), Serum <10 (L) 10 - 30 ug/mL    Comment: (NOTE) Therapeutic concentrations vary  significantly. A range of 10-30 ug/mL  may be an effective concentration for many patients. However, some  are best treated at concentrations outside of this range. Acetaminophen concentrations >150 ug/mL at 4 hours after ingestion  and >50 ug/mL at 12 hours after ingestion are often associated with  toxic reactions. Performed at [facility], [address] [place], [place] [address]   cbc     Status: Abnormal   Collection Time: 01/25/20 10:29 PM  Result Value Ref Range   WBC 3.3 (L) 4.0 - 10.5 K/uL   RBC 4.08 (L) 4.22 - 5.81 MIL/uL   Hemoglobin 10.5 (L) 13.0 - 17.0 g/dL   HCT 42.5 (L) 95.6 - 38.7 %   MCV 85.0 80.0 - 100.0 fL   MCH 25.7 (L) 26.0 - 34.0 pg   MCHC 30.3 30.0 - 36.0 g/dL   RDW 56.4 (H) 33.2 - 95.1 %   Platelets 282 150 - 400 K/uL   nRBC 0.0 0.0 - 0.2 %    Comment: Performed at [facility], [address], [place], [place] [address]  Respiratory Panel by RT PCR (Flu A&B, Covid) - Nasopharyngeal Swab     Status: None   Collection Time: 01/26/20  8:16 AM   Specimen: Nasopharyngeal Swab  Result Value Ref Range   SARS Coronavirus 2 by RT PCR NEGATIVE NEGATIVE    Comment: (NOTE) SARS-CoV-2 target nucleic acids are NOT DETECTED. The SARS-CoV-2 RNA is generally detectable in upper respiratoy specimens during the acute phase of infection. The lowest concentration of SARS-CoV-2 viral copies this assay can detect is 131 copies/mL. A negative result does not preclude SARS-Cov-2 infection and should not be used as the sole basis for treatment or other patient management decisions. A negative result may occur with  improper specimen collection/handling, submission of specimen other than nasopharyngeal swab, presence of viral mutation(s) within the areas targeted by this assay, and inadequate number of viral copies (<131 copies/mL). A negative result must be combined with clinical observations, patient history, and epidemiological information.  The expected result is  Negative. Fact Sheet for Patients:  PinkCheek.be Fact Sheet for Healthcare Providers:  GravelBags.it This test is not yet ap proved or cleared by the [place] FDA and  has been authorized for detection and/or diagnosis of SARS-CoV-2 by FDA under an Emergency Use Authorization (EUA). This EUA will remain  in effect (meaning this test can be used) for the duration of the COVID-19 declaration under Section 564(b)(1) of the Act, 21 U.S.C. section 360bbb-3(b)(1), unless the authorization is terminated or revoked sooner.    Influenza A by PCR NEGATIVE NEGATIVE   Influenza B by PCR NEGATIVE NEGATIVE    Comment: (NOTE) The Xpert Xpress SARS-CoV-2/FLU/RSV assay is intended as an aid in  the diagnosis of influenza from Nasopharyngeal swab specimens and  should not be used as a sole basis for treatment. Nasal washings and  aspirates are unacceptable for Xpert Xpress SARS-CoV-2/FLU/RSV  testing. Fact Sheet for Patients: PinkCheek.be Fact Sheet for Healthcare Providers: GravelBags.it This test is not yet approved or cleared by the [place] FDA and  has been authorized for detection and/or diagnosis of SARS-CoV-2 by  FDA under an Emergency Use Authorization (EUA). This EUA will remain  in effect (meaning this test can be used) for the duration of the  Covid-19 declaration under Section 564(b)(1) of the Act, 21  U.S.C. section 360bbb-3(b)(1), unless the authorization is  terminated or revoked. Performed at [facility], [address], [place],  [address]     Blood Alcohol level:  Lab Results  Component Value Date   [facility] <10 01/25/2020   ETH <10 73/71/0626    Metabolic Disorder Labs:  No results found for: HGBA1C, MPG No results found for: PROLACTIN No results found for: CHOL, TRIG, HDL, CHOLHDL, VLDL, LDLCALC  Current Medications: Current  Facility-Administered Medications  Medication Dose Route Frequency Provider Last Rate Last Admin  . acetaminophen (TYLENOL) tablet 650 mg  650 mg Oral Q6H PRN Dixon, Rashaun M, NP      . alum & mag hydroxide-simeth (MAALOX/MYLANTA) 200-200-20 MG/5ML suspension 30 mL  30 mL Oral Q4H PRN Dixon, Rashaun M, NP      . buprenorphine-naloxone (SUBOXONE) 2-0.5 mg per SL tablet 1 tablet  1 tablet Sublingual Daily Ashyla Luth, Madie Reno, MD   1 tablet at 01/27/20 [phone]  . buprenorphine-naloxone (SUBOXONE) 8-2 mg per SL tablet 2 tablet  2 tablet Sublingual Daily Shaakira Borrero, Madie Reno, MD   2 tablet at 01/27/20 [phone]  . citalopram (CELEXA) tablet 20 mg  20 mg Oral Daily Alina Gilkey T, MD      . hydrOXYzine (ATARAX/VISTARIL) tablet 25 mg  25 mg Oral TID PRN Dixon, Ernst Bowler, NP      . magnesium hydroxide (MILK OF MAGNESIA) suspension 30 mL  30 mL Oral Daily PRN Dixon, Rashaun M, NP      . traZODone (DESYREL) tablet 50 mg  50 mg Oral QHS PRN Deloria Lair, NP       PTA Medications: Medications Prior to Admission  Medication Sig Dispense Refill Last Dose  . buprenorphine (SUBUTEX) 2 MG SUBL SL tablet Place under the tongue daily.       Musculoskeletal: Strength & Muscle Tone: within normal limits Gait & Station: normal Patient leans: N/A  Psychiatric Specialty Exam: Physical Exam  Nursing note and vitals reviewed. Constitutional: He appears well-developed and well-nourished.  HENT:  Head: Normocephalic and atraumatic.  Eyes: Pupils are equal, round, and reactive to light. Conjunctivae are normal.  Cardiovascular: Regular rhythm  and normal heart sounds.  Respiratory: Effort normal. No respiratory distress.  GI: Soft.  Musculoskeletal:        General: Normal range of motion.     Cervical back: Normal range of motion.  Neurological: He is alert.  Skin: Skin is warm and dry.  Psychiatric: His mood appears anxious. His affect is blunt. His speech is delayed. He is slowed and withdrawn. Cognition and memory  are impaired. He expresses impulsivity. He exhibits a depressed mood. He expresses suicidal ideation. He expresses no suicidal plans.    Review of Systems  Constitutional: Negative.   HENT: Negative.   Eyes: Negative.   Respiratory: Negative.   Cardiovascular: Negative.   Gastrointestinal: Negative.   Musculoskeletal: Negative.   Skin: Negative.   Neurological: Negative.   Psychiatric/Behavioral: Positive for behavioral problems, dysphoric mood and suicidal ideas. Negative for self-injury. The patient is nervous/anxious.     Blood pressure 113/78, pulse 68, temperature 98.8 F (37.1 C), temperature source Oral, resp. rate 16, height 6' (1.829 m), weight 75.8 kg, SpO2 100 %.Body mass index is 22.66 kg/m.  General Appearance: Casual  Eye Contact:  Fair  Speech:  Slow  Volume:  Decreased  Mood:  Dysphoric  Affect:  Constricted  Thought Process:  Coherent  Orientation:  Full (Time, Place, and Person)  Thought Content:  Logical  Suicidal Thoughts:  Yes.  without intent/plan  Homicidal Thoughts:  No  Memory:  Immediate;   Fair Recent;   Fair Remote;   Fair  Judgement:  Fair  Insight:  Fair  Psychomotor Activity:  NA  Concentration:  Concentration: Fair  Recall:  [employer] of Knowledge:  Fair  Language:  Fair  Akathisia:  No  Handed:  Right  AIMS (if indicated):     Assets:  Desire for Improvement Housing Physical Health  ADL's:  Intact  Cognition:  WNL  Sleep:  Number of Hours: 4    Treatment Plan Summary: Daily contact with patient to assess and evaluate symptoms and progress in treatment, Medication management and Plan Patient will be started on citalopram low-dose for treatment of anxiety and depression.  I have restarted his buprenorphine after confirming that he does get 18 mg a day.  Patient will be included in groups and activities and assessed for appropriate follow-up substance abuse treatment  Observation Level/Precautions:  15 minute checks  Laboratory:   Chemistry Profile  Psychotherapy:    Medications:    Consultations:    Discharge Concerns:    Estimated LOS:  Other:     Physician Treatment Plan for Primary Diagnosis: Severe recurrent major depression without psychotic features (HCC) Long Term Goal(s): Improvement in symptoms so as ready for discharge  Short Term Goals: Ability to verbalize feelings will improve and Ability to disclose and discuss suicidal ideas  Physician Treatment Plan for Secondary Diagnosis: Principal Problem:   Severe recurrent major depression without psychotic features (HCC) Active Problems:   Opioid dependence (HCC)   Polysubstance abuse (HCC)   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    Mordecai Rasmussen, MD 3/8/20211:51 PM

## 2020-01-27 NOTE — Tx Team (Signed)
Initial Treatment Plan 01/27/2020 1:54 AM Nathaniel Hansen BUY:[id number]    PATIENT STRESSORS: Financial difficulties Substance abuse   PATIENT STRENGTHS: Ability for insight Motivation for treatment/growth   PATIENT IDENTIFIED PROBLEMS: Suicidal Ideation  Depression  Anxiety                 DISCHARGE CRITERIA:  Motivation to continue treatment in a less acute level of care Verbal commitment to aftercare and medication compliance  PRELIMINARY DISCHARGE PLAN: Outpatient therapy Placement in alternative living arrangements  PATIENT/FAMILY INVOLVEMENT: This treatment plan has been presented to and reviewed with the patient, Nathaniel Hansen. The patient has been given the opportunity to ask questions and make suggestions.  Elmyra Ricks, RN 01/27/2020, 1:54 AM

## 2020-01-27 NOTE — [facility] Group Notes (Signed)
LCSW Group Therapy Note   01/27/2020 12:27 PM   Type of Therapy and Topic:  Group Therapy:  Overcoming Obstacles   Participation Level:  Active   Description of Group:    In this group patients will be encouraged to explore what they see as obstacles to their own wellness and recovery. They will be guided to discuss their thoughts, feelings, and behaviors related to these obstacles. The group will process together ways to cope with barriers, with attention given to specific choices patients can make. Each patient will be challenged to identify changes they are motivated to make in order to overcome their obstacles. This group will be process-oriented, with patients participating in exploration of their own experiences as well as giving and receiving support and challenge from other group members.   Therapeutic Goals: 1. Patient will identify personal and current obstacles as they relate to admission. 2. Patient will identify barriers that currently interfere with their wellness or overcoming obstacles.  3. Patient will identify feelings, thought process and behaviors related to these barriers. 4. Patient will identify two changes they are willing to make to overcome these obstacles:      Summary of Patient Progress Pt was appropriate and respectful in group. Pt was able to identify his current obstacle as financial stability. Pt reported that he cannot be financially stable because he spends his money on drugs. Pt identified going to the clinic everyday as something he wants to do differently when he discharges stating that he feels better when he goes to clinic and he does not use other drugs. Pt identified smoking marijuana as a means to keep his anxiety in order and reported that if he goes to the clinic then he does not smoke marijuana.     Therapeutic Modalities:   Cognitive Behavioral Therapy Solution Focused Therapy Motivational Interviewing Relapse Prevention Therapy  Iris Pert,  MSW, LCSW Clinical Social Work 01/27/2020 12:27 PM

## 2020-01-27 NOTE — [facility] Suicide Risk Assessment (Signed)
[facility] INPATIENT:  Family/Significant Other Suicide Prevention Education  Suicide Prevention Education:  Patient Refusal for Family/Significant Other Suicide Prevention Education: The patient Nathaniel Hansen has refused to provide written consent for family/significant other to be provided Family/Significant Other Suicide Prevention Education during admission and/or prior to discharge.  Physician notified.  SPE completed with pt, as pt refused to consent to family contact. SPI pamphlet provided to pt and pt was encouraged to share information with support network, ask questions, and talk about any concerns relating to SPE. Pt denies access to guns/firearms and verbalized understanding of information provided. Mobile Crisis information also provided to pt.   Harden Mo 01/27/2020, 11:05 AM

## 2020-01-27 NOTE — BHH Counselor (Signed)
Adult Comprehensive Assessment  Patient ID: Nathaniel Hansen, male   DOB: Mar 22, 1986, 34 y.o.   MRN: [id number]  Information Source: Information source: Patient  Current Stressors:  Patient states their primary concerns and needs for treatment are:: Pt reports "anxiety got bad. I started using opiates several years ago and I recently started withdrawing and my anxiety increased and I had thoughts of killing myself." Patient states their goals for this hospitilization and ongoing recovery are:: Pt reports "get a job and get back to school" Educational / Learning stressors: Pt denies. Employment / Job issues: Pt denies. Family Relationships: Pt reports "It ain't too good right now". Financial / Lack of resources (include bankruptcy): Pt reports "no job, no money". Housing / Lack of housing: Pt reports "sometimes I dont have a place to stay". Physical health (include injuries & life threatening diseases): Pt denies. Social relationships: Pt denies. Substance abuse: Pt reports cocaine and heroin use. Bereavement / Loss: Pt denies.  Living/Environment/Situation:  Living Arrangements: Parent, Other relatives Who else lives in the home?: Pt reports that he lives with his parents and brother. How long has patient lived in current situation?: Pt reports 2-3 years. What is atmosphere in current home: Comfortable  Family History:  Marital status: Single Are you sexually active?: Yes What is your sexual orientation?: Heterosexual Has your sexual activity been affected by drugs, alcohol, medication, or emotional stress?: Pt denies. Does patient have children?: Yes How many children?: 1 How is patient's relationship with their children?: Pt denies.  Childhood History:  By whom was/is the patient raised?: Both parents Description of patient's relationship with caregiver when they were a child: Pt reports "good". Patient's description of current relationship with people who raised him/her:  Pt reports "not so good". How were you disciplined when you got in trouble as a child/adolescent?: Pt reports "grounded or whooped". Does patient have siblings?: Yes Number of Siblings: 2 Description of patient's current relationship with siblings: Pt reports "not so good with my brother, used to be better with my sister". Did patient suffer any verbal/emotional/physical/sexual abuse as a child?: No Did patient suffer from severe childhood neglect?: No Has patient ever been sexually abused/assaulted/raped as an adolescent or adult?: No Was the patient ever a victim of a crime or a disaster?: No Witnessed domestic violence?: No Has patient been effected by domestic violence as an adult?: No  Education:  Highest grade of school patient has completed: 8th grade Currently a student?: No Learning disability?: No  Employment/Work Situation:   Employment situation: Unemployed What is the longest time patient has a held a job?: 2 years Where was the patient employed at that time?: [employer] Did You Receive Any Psychiatric Treatment/Services While in Passenger transport manager?: No(NA) Are There Guns or Other Weapons in [place]?: No  Financial Resources:   Museum/gallery curator resources: Support from parents / caregiver Does patient have a Programmer, applications or guardian?: No  Alcohol/Substance Abuse:   What has been your use of drugs/alcohol within the last 12 months?: Heroin: $100/week, daily, last use week or two ago; Cocaine: daily, $100/week, last use day before hospital.  Both used via IV. If attempted suicide, did drugs/alcohol play a role in this?: No Alcohol/Substance Abuse Treatment Hx: Denies past history Has alcohol/substance abuse ever caused legal problems?: No  Social Support System:   Patient's Community Support System: Poor Describe Community Support System: Pt reports "my sister". Type of faith/religion: Pt reports "I'm spiritual." How does patient's faith help to cope with current  illness?: Pt reports "I am spiritual, I believe in the Holy Ghost."  Leisure/Recreation:   Leisure and Hobbies: Pt reports "fishing, basketball, chilling with friends".  Strengths/Needs:   What is the patient's perception of their strengths?: Pt reports "I'm outgoing and a people person." Patient states these barriers may affect/interfere with their treatment: Pt denies. Patient states these barriers may affect their return to the community: Pt denies.  Discharge Plan:   Currently receiving community mental health services: No Patient states concerns and preferences for aftercare planning are: Pt reports that he is seeking referrals for residential. Patient states they will know when they are safe and ready for discharge when: Pt reports "when anxiety isn't so bad". Does patient have access to transportation?: No Does patient have financial barriers related to discharge medications?: No Patient description of barriers related to discharge medications: Pt does not have insurance. Plan for no access to transportation at discharge: CSW will assist with transportation. Will patient be returning to same living situation after discharge?: Yes  Summary/Recommendations:   Summary and Recommendations (to be completed by the evaluator): Patient is a 34 year old single male [place] [facility]  He presents to the hospital following increase in thoughts of suicide.  He has a primary diagnosis of Polysubtance Abuse.   Recommendations include: crisis stabilization, therapeutic milieu, encourage group attendance and participation, medication management for detox/mood stabilization and development of comprehensive mental wellness/sobriety plan.  Harden Mo. 01/27/2020

## 2020-01-27 NOTE — BHH Counselor (Signed)
CSW faxed referrals for [facility] and [facility]. Pt declined the [facility] program.  CSW received confirmation that fax was successful.  Penni Homans, MSW, LCSW 01/27/2020 2:21 PM

## 2020-01-27 NOTE — BHH Group Notes (Signed)
[facility] Group Notes:  (Nursing/MHT/Case Management/Adjunct)  Date:  01/27/2020  Time:  10:23 PM  Type of Therapy:  Group Therapy  Participation Level:  Active  Participation Quality:  Appropriate, Sharing and Supportive  Affect:  Appropriate  Cognitive:  Alert and Appropriate  Insight:  Appropriate and Good  Engagement in Group:  Engaged and Supportive  Modes of Intervention:  Education and Support  Summary of Progress/Problems: PT was very engaged in group. He was very supportive and listened to others. He also understood some of the other PT situation and was helpful/supportive to them.   Nathaniel Hansen 01/27/2020, 10:23 PM

## 2020-01-27 NOTE — Plan of Care (Signed)
Pt new to the unit, hasn't had time to progress  Problem: Safety: Goal: Periods of time without injury will increase Outcome: Not Progressing   Problem: Education: Goal: Ability to make informed decisions regarding treatment will improve Outcome: Not Progressing   Problem: Self-Concept: Goal: Ability to disclose and discuss suicidal ideas will improve Outcome: Not Progressing Goal: Will verbalize positive feelings about self Outcome: Not Progressing   Problem: Education: Goal: Knowledge of [place] [facility] Education information/materials will improve Outcome: Not Progressing Goal: Emotional status will improve Outcome: Not Progressing Goal: Mental status will improve Outcome: Not Progressing Goal: Verbalization of understanding the information provided will improve Outcome: Not Progressing

## 2020-01-27 NOTE — Progress Notes (Signed)
Patient was admitted from [facility] at 0130, report was received from [place], [place]. Patient was pleasant during admission but tired. Patient denies HI/AVH with this Clinical research associate. Patient endorses passive SI, OD on medications, but verbally contracts for safety with this Clinical research associate. Patient given education, support and encouragement to be active in his treatment plan. Patient oriented to the unit and his room. Pt stated he takes medications for his withdrawal symptoms as he is withdrawaling from cocaine which the patient states he has been shooting up. Track mark on his right forearm, no other abnormalities or contraband found on the patient. Skin check completed with Britta Mccreedy, RN. Patient informed to let staff know if there are any issues or problems on the unit. Pt being monitored Q 15 minutes for safety per unit protocol. Patient remains safe on the unit.

## 2020-01-27 NOTE — Plan of Care (Signed)
Patient stated that he likes to take some medicine for depression and his multiple stresses.Patient is appropriate in the unit.Denies SI,HI and AVH.Compliant with medications.Attended groups.Appetite and energy level good.Verbalized minimal withdrawal symptoms.Support and encouragement given.

## 2020-01-27 NOTE — BHH Group Notes (Signed)
[facility] Group Notes:  (Nursing/MHT/Case Management/Adjunct)  Date:  01/27/2020  Time:  11:42 AM  Type of Therapy:  Community Meeting  Participation Level:  Did Not Attend   Summary of Progress/Problems:  Nathaniel Hansen 01/27/2020, 11:42 AM

## 2020-01-27 NOTE — BHH Group Notes (Signed)
B[facility] Group Notes:  (Nursing/MHT/Case Management/Adjunct)  Date:  01/27/2020  Time:  11:43 AM  Type of Therapy:  Psychoeducational Skills  Participation Level:  Did Not Attend   Summary of Progress/Problems:  Kerrie Pleasure 01/27/2020, 11:43 AM

## 2020-01-27 NOTE — BHH Suicide Risk Assessment (Signed)
[facility] Admission Suicide Risk Assessment   Nursing information obtained from:  Patient Demographic factors:  Male, Low socioeconomic status, Unemployed Current Mental Status:  Suicidal ideation indicated by patient Loss Factors:  NA(Drug addiction/homeless) Historical Factors:  NA Risk Reduction Factors:  NA  Total Time spent with patient: 1 hour Principal Problem: Severe recurrent major depression without psychotic features ([place]) Diagnosis:  Principal Problem:   Severe recurrent major depression without psychotic features ([place]) Active Problems:   Opioid dependence ([place])   Polysubstance abuse ([place])   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  34 year old man with substance abuse came to the emergency room reporting suicidal ideation.  Currently cooperative with treatment.  Denies acute suicidal intent but still feeling very anxious and dysphoric  Continued Clinical Symptoms:  Alcohol Use Disorder Identification Test Final Score (AUDIT): 0 The "Alcohol Use Disorders Identification Test", Guidelines for Use in Primary Care, Second Edition.  World Pharmacologist [facility]). Score between 0-7:  no or low risk or alcohol related problems. Score between 8-15:  moderate risk of alcohol related problems. Score between 16-19:  high risk of alcohol related problems. Score 20 or above:  warrants further diagnostic evaluation for alcohol dependence and treatment.   CLINICAL FACTORS:   Depression:   Anhedonia Comorbid alcohol abuse/dependence Alcohol/Substance Abuse/Dependencies   Musculoskeletal: Strength & Muscle Tone: within normal limits Gait & Station: normal Patient leans: N/A  Psychiatric Specialty Exam: Physical Exam  Nursing note and vitals reviewed. Constitutional: He appears well-developed and well-nourished.  HENT:  Head: Normocephalic and atraumatic.  Eyes: Pupils are equal, round, and reactive to light.  Conjunctivae are normal.  Cardiovascular: Regular rhythm and normal heart sounds.  Respiratory: Effort normal. No respiratory distress.  GI: Soft.  Musculoskeletal:        General: Normal range of motion.     Cervical back: Normal range of motion.  Neurological: He is alert.  Skin: Skin is warm and dry.  Psychiatric: His affect is blunt. His speech is delayed. He is slowed. Thought content is paranoid. Thought content is not delusional. He expresses impulsivity. He exhibits abnormal recent memory.    Review of Systems  Constitutional: Negative.   HENT: Negative.   Eyes: Negative.   Respiratory: Negative.   Cardiovascular: Negative.   Gastrointestinal: Negative.   Musculoskeletal: Negative.   Skin: Negative.   Neurological: Negative.   Psychiatric/Behavioral: Positive for behavioral problems, decreased concentration, dysphoric mood, sleep disturbance and suicidal ideas. Negative for hallucinations. The patient is nervous/anxious.     Blood pressure 113/78, pulse 68, temperature 98.8 F (37.1 C), temperature source Oral, resp. rate 16, height 6' (1.829 m), weight 75.8 kg, SpO2 100 %.Body mass index is 22.66 kg/m.  General Appearance: Disheveled  Eye Contact:  Fair  Speech:  Normal Rate  Volume:  Decreased  Mood:  Anxious, Dysphoric and Hopeless  Affect:  Congruent  Thought Process:  Goal Directed  Orientation:  Full (Time, Place, and Person)  Thought Content:  Logical  Suicidal Thoughts:  Yes.  without intent/plan  Homicidal Thoughts:  No  Memory:  Immediate;   Fair Recent;   Poor Remote;   Fair  Judgement:  Fair  Insight:  Fair  Psychomotor Activity:  Normal  Concentration:  Concentration: Fair  Recall:  [employer] of Knowledge:  Fair  Language:  Fair  Akathisia:  No  Handed:  Right  AIMS (if indicated):     Assets:  Desire for [place]  ADL's:  Impaired  Cognition:  Impaired,  Mild  Sleep:  Number of Hours: 4      COGNITIVE  FEATURES THAT CONTRIBUTE TO RISK:  None    SUICIDE RISK:   Minimal: No identifiable suicidal ideation.  Patients presenting with no risk factors but with morbid ruminations; may be classified as minimal risk based on the severity of the depressive symptoms  PLAN OF CARE: Continue 15-minute checks.  Continue outpatient medication.  Engage in individual and group therapy.  Treatment team will reassess patient to make recommendations about inpatient or outpatient follow-up for substance abuse  I certify that inpatient services furnished can reasonably be expected to improve the patient's condition.   Mordecai Rasmussen, MD 01/27/2020, 1:48 PM

## 2020-01-28 LAB — CULTURE, BLOOD (ROUTINE X 2)
Culture: NO GROWTH
Culture: NO GROWTH
Special Requests: ADEQUATE
Special Requests: ADEQUATE

## 2020-01-28 NOTE — Progress Notes (Signed)
[facility] MD Progress Note  01/28/2020 5:15 PM Nathaniel Hansen  MRN:  [id number] Subjective: Patient seen chart reviewed.  Patient says he is feeling much better.  Physically feels well.  Mentally feels more stable and confident of himself.  Not having any hallucinations.  Denies any suicidal or homicidal thought.  Attending groups and feels like he is benefiting from it. 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 Anxiety   . Asthma   . Cocaine abuse (HCC)   . Endocarditis   . Opioid abuse (H[facility])    History reviewed. No pertinent surgical history. Family History: History reviewed. No pertinent family history. Family Psychiatric  History: See previous Social History:  Social History   Substance and Sexual Activity  Alcohol Use Yes   Comment: sometimes     Social History   Substance and Sexual Activity  Drug Use Yes  . Types: Marijuana, Heroin   Comment: "sometimes" opioids, heroin     Social History   Socioeconomic History  . Marital status: Single    Spouse name: Not on file  . Number of children: Not on file  . Years of education: Not on file  . Highest education level: Not on file  Occupational History  . Not on file  Tobacco Use  . Smoking status: Current Every Day Smoker    Packs/day: 1.00    Types: Cigarettes  . Smokeless tobacco: Never Used  Substance and Sexual Activity  . Alcohol use: Yes    Comment: sometimes  . Drug use: Yes    Types: Marijuana, Heroin    Comment: "sometimes" opioids, heroin   . Sexual activity: Not on  file  Other Topics Concern  . Not on file  Social History Narrative  . Not on file   Social Determinants of Health   Financial Resource Strain:   . Difficulty of Paying Living Expenses: Not on file  Food Insecurity:   . Worried About Programme researcher, broadcasting/film/video in the Last Year: Not on file  . Ran Out of Food in the Last Year: Not on file  Transportation Needs:   . Lack of Transportation (Medical): Not on file  . Lack of Transportation (Non-Medical): Not on file  Physical Activity:   . Days of Exercise per Week: Not on file  . Minutes of Exercise per Session: Not on file  Stress:   . Feeling of Stress : Not on file  Social Connections:   . Frequency of Communication with Friends and Family: Not on file  . Frequency of Social Gatherings with Friends and Family: Not on file  . Attends Religious Services: Not on file  . Active Member of Clubs or Organizations: Not on file  . Attends Banker Meetings: Not on file  . Marital Status: Not on file   Additional Social History:                         Sleep: Fair  Appetite:  Fair  Current Medications: Current Facility-Administered Medications  Medication Dose Route Frequency Provider Last Rate Last Admin  . acetaminophen (  TYLENOL) tablet 650 mg  650 mg Oral Q6H PRN Dixon, Rashaun M, NP      . alum & mag hydroxide-simeth (MAALOX/MYLANTA) 200-200-20 MG/5ML suspension 30 mL  30 mL Oral Q4H PRN Dixon, Rashaun M, NP      . buprenorphine-naloxone (SUBOXONE) 2-0.5 mg per SL tablet 1 tablet  1 tablet Sublingual Daily Elesa Garman, Madie Reno, MD   1 tablet at 01/28/20 0825  . buprenorphine-naloxone (SUBOXONE) 8-2 mg per SL tablet 2 tablet  2 tablet Sublingual Daily Eryka Dolinger, Madie Reno, MD   2 tablet at 01/28/20 0825  . citalopram (CELEXA) tablet 20 mg  20 mg Oral Daily Kensley Valladares T, MD   20 mg at 01/28/20 0825  . hydrOXYzine (ATARAX/VISTARIL) tablet 25 mg  25 mg Oral TID PRN Deloria Lair, NP      . magnesium hydroxide (MILK OF  MAGNESIA) suspension 30 mL  30 mL Oral Daily PRN Dixon, Ernst Bowler, NP      . traZODone (DESYREL) tablet 50 mg  50 mg Oral QHS PRN Deloria Lair, NP        Lab Results: No results found for this or any previous visit (from the past 48 hour(s)).  Blood Alcohol level:  Lab Results  Component Value Date   ETH <10 01/25/2020   ETH <10 82/99/3716    Metabolic Disorder Labs: No results found for: HGBA1C, MPG No results found for: PROLACTIN No results found for: CHOL, TRIG, HDL, CHOLHDL, VLDL, LDLCALC  Physical Findings: AIMS:  , ,  ,  ,    CIWA:    COWS:     Musculoskeletal: Strength & Muscle Tone: within normal limits Gait & Station: normal Patient leans: N/A  Psychiatric Specialty Exam: Physical Exam  Nursing note and vitals reviewed. Constitutional: He appears well-developed and well-nourished.  HENT:  Head: Normocephalic and atraumatic.  Eyes: Pupils are equal, round, and reactive to light. Conjunctivae are normal.  Cardiovascular: Regular rhythm and normal heart sounds.  Respiratory: Effort normal.  GI: Soft.  Musculoskeletal:        General: Normal range of motion.     Cervical back: Normal range of motion.  Neurological: He is alert.  Skin: Skin is warm and dry.  Psychiatric: He has a normal mood and affect. His speech is normal and behavior is normal. Judgment and thought content normal. Cognition and memory are normal.    Review of Systems  Constitutional: Negative.   HENT: Negative.   Eyes: Negative.   Respiratory: Negative.   Cardiovascular: Negative.   Gastrointestinal: Negative.   Musculoskeletal: Negative.   Skin: Negative.   Neurological: Negative.   Psychiatric/Behavioral: Negative.     Blood pressure 123/63, pulse (!) 56, temperature 97.6 F (36.4 C), temperature source Oral, resp. rate 18, height 6' (1.829 m), weight 75.8 kg, SpO2 100 %.Body mass index is 22.66 kg/m.  General Appearance: Casual  Eye Contact:  Good  Speech:  Clear and  Coherent  Volume:  Normal  Mood:  Euthymic  Affect:  Constricted  Thought Process:  Coherent  Orientation:  Full (Time, Place, and Person)  Thought Content:  Logical  Suicidal Thoughts:  No  Homicidal Thoughts:  No  Memory:  Immediate;   Fair Recent;   Fair Remote;   Fair  Judgement:  Fair  Insight:  Fair  Psychomotor Activity:  Decreased  Concentration:  Concentration: Fair  Recall:  [employer] of Knowledge:  Fair  Language:  Fair  Akathisia:  No  Handed:  Right  AIMS (if indicated):     Assets:  Desire for Improvement Housing Physical Health Resilience  ADL's:  Intact  Cognition:  WNL  Sleep:  Number of Hours: 7.75     Treatment Plan Summary: Daily contact with patient to assess and evaluate symptoms and progress in treatment, Medication management and Plan No change to medicine.  Continue citalopram.  Trazodone as needed.  Encourage group attendance.  Probably looking at length of stay 1-2 more days  Nathaniel Rasmussen, MD 01/28/2020, 5:15 PM

## 2020-01-28 NOTE — BHH Group Notes (Signed)
B[facility] Group Notes:  (Nursing/MHT/Case Management/Adjunct)  Date:  01/28/2020  Time: 8:30AM Type of Therapy:  Community Meeting  Participation Level:  Active  Participation Quality:  Appropriate and Attentive  Affect:  Appropriate  Cognitive:  Alert and Appropriate  Insight:  Appropriate and Good  Engagement in Group:  Engaged  Modes of Intervention:  Discussion and Orientation  Summary of Progress/Problems:  Nathaniel Hansen 01/28/2020, 9:25 AM

## 2020-01-28 NOTE — Progress Notes (Signed)
CSW called [facility] who reported that they have a waitlist 2-3 weeks out and they cannot complete the prescreen for pt until it is almost time for him to possibly have a bed.   CSW spoke with Candy at [facility] who reported that pt is currently being reviewed by the doctor.   Iris Pert, MSW, LCSW Clinical Social Work 01/28/2020 1:59 PM

## 2020-01-28 NOTE — Progress Notes (Signed)
Recreation Therapy Notes   Date: 01/28/2020  Time: 9:30 am  Location: Craft Room  Behavioral response: Appropriate   Intervention Topic: Goals   Discussion/Intervention:  Group content on today was focused on goals. Patients described what goals are and how they define goals. Individuals expressed how they go about setting goals and reaching them. The group identified how important goals are and if they make short term goals to reach long term goals. Patients described how many goals they work on at a time and what affects them not reaching their goal. Individuals described how much time they put into planning and obtaining their goals. The group participated in the intervention "My Goal Board" and made personal goal boards to help them achieve their goal. Clinical Observations/Feedback:  Patient came to group late and was focused on what peers and staff had to say about goals. He stated that short term goals are done in a week and long term goals are done in a life time. Individual was social with peers and staff while participating in the intervention during group.  Vernell Back LRT/CTRS         Chene Kasinger 01/28/2020 1:04 PM

## 2020-01-28 NOTE — BHH Group Notes (Signed)
[facility] Group Notes:  (Nursing/MHT/Case Management/Adjunct)  Date:  01/28/2020  Time:  2:54 PM  Type of Therapy:  Psychoeducational Skills  Participation Level:  Active  Participation Quality:  Appropriate, Attentive and Sharing  Affect:  Appropriate  Cognitive:  Alert  Insight:  Appropriate  Engagement in Group:  Engaged  Modes of Intervention:  Activity, Discussion and Education  Summary of Progress/Problems:  Nathaniel Hansen [facility] 01/28/2020, 2:54 PM

## 2020-01-28 NOTE — Plan of Care (Signed)
  Problem: Education: Goal: Knowledge of [place] [facility] Education information/materials will improve Outcome: Progressing Goal: Emotional status will improve Outcome: Progressing Goal: Mental status will improve Outcome: Progressing Goal: Verbalization of understanding the information provided will improve Outcome: Progressing  D: Patient has been pleasant and cooperative. Denies SI, HI and AVH. No reported withdrawal symptoms. Attended group. Very polite and appreciative of the care he is receiving here.  A: Continue to monitor for safety R: Safety maintained.

## 2020-01-28 NOTE — Progress Notes (Signed)
D: Patient has been pleasant and cooperative. Denies SI, HI and AVH. No reported withdrawal symptoms. Attended group. Very polite and appreciative of the care he is receiving here.  A: Continue to monitor for safety R: Safety maintained.

## 2020-01-28 NOTE — [facility] Group Notes (Signed)
LCSW Group Therapy Note  01/28/2020 1:00 PM  Type of Therapy/Topic:  Group Therapy:  Feelings about Diagnosis  Participation Level:  Active   Description of Group:   This group will allow patients to explore their thoughts and feelings about diagnoses they have received. Patients will be guided to explore their level of understanding and acceptance of these diagnoses. Facilitator will encourage patients to process their thoughts and feelings about the reactions of others to their diagnosis and will guide patients in identifying ways to discuss their diagnosis with significant others in their lives. This group will be process-oriented, with patients participating in exploration of their own experiences, giving and receiving support, and processing challenge from other group members.   Therapeutic Goals: 1. Patient will demonstrate understanding of diagnosis as evidenced by identifying two or more symptoms of the disorder 2. Patient will be able to express two feelings regarding the diagnosis 3. Patient will demonstrate their ability to communicate their needs through discussion and/or role play  Summary of Patient Progress: Patient was present in group. Patient was an active participant.  Patient shared his understanding of the diagnosis and asked for clarification when needed.  Patient was supportive of other group members.    Therapeutic Modalities:   Cognitive Behavioral Therapy Brief Therapy Feelings Identification   Penni Homans, MSW, LCSW 01/28/2020 12:47 PM

## 2020-01-28 NOTE — [facility] Group Notes (Signed)
[facility] Group Notes:  (Nursing/MHT/Case Management/Adjunct)  Date:  01/28/2020  Time:  9:42 PM  Type of Therapy:  Group Therapy  Participation Level:  Active  Participation Quality:  Appropriate, Sharing and Supportive  Affect:  Appropriate  Cognitive:  Alert and Appropriate  Insight:  Appropriate and Good  Engagement in Group:  Engaged and Supportive  Modes of Intervention:  Education and Support  Summary of Progress/Problems: PT wants to stay away from drugs. Set small goals first and work his way up to bigger goals. Get closer to God  Nathaniel Hansen 01/28/2020, 9:42 PM

## 2020-01-28 NOTE — Progress Notes (Signed)
D: Major Depression   A: Patient stated slept fair last night .Stated appetite is good and energy level  Is normal. Stated concentration is good . Stated on Depression scale 4 , hopeless 3 and anxiety 4 .( low 0-10 high) Denies suicidal  homicidal ideations  .No auditory hallucinations  No pain concerns . Appropriate ADL'S. Interacting with peers and staff. Patient aware of [place] [facility] , able to verbalize understanding of information received . Emotional and mental status improved . Voice no concerns around safety . Working on coping , decision making and anxiety.  Voice of no safety concerns . Denies suicidal  ideations . Encourage  Patient to work on his Pharmacologist  Encourage patient participation with unit programming . Instruction  Given on  Medication , verbalize understanding.    R: Voice no other concerns. Staff continue to monitor

## 2020-01-28 NOTE — Plan of Care (Signed)
Patient aware of [facility] , able to verbalize understanding of information received . Emotional and mental status improved . Voice no concerns around safety . Working on coping , decision making and anxiety.  Voice of no safety concerns . Denies suicidal  ideations .  Problem: Education: Goal: Knowledge of [place] [facility] Education information/materials will improve Outcome: Progressing Goal: Emotional status will improve Outcome: Progressing Goal: Mental status will improve Outcome: Progressing Goal: Verbalization of understanding the information provided will improve Outcome: Progressing   Problem: Safety: Goal: Periods of time without injury will increase Outcome: Progressing   Problem: Education: Goal: Ability to make informed decisions regarding treatment will improve Outcome: Progressing   Problem: Education: Goal: Ability to make informed decisions regarding treatment will improve Outcome: Progressing   Problem: Self-Concept: Goal: Ability to disclose and discuss suicidal ideas will improve Outcome: Progressing Goal: Will verbalize positive feelings about self Outcome: Progressing

## 2020-01-28 NOTE — Progress Notes (Signed)
   01/28/20 1400  Clinical Encounter Type  Visited With Patient;Other (Comment)  Visit Type Initial;Spiritual support;Social support;Behavioral Health  Referral From Chaplain  Consult/Referral To Chaplain  Patient was participated in a group on Resilience conducted by [place]. Patient was fully engaged and gave a lot of input. Patient was a pleasure to be with. Patient said he enjoyed the group.

## 2020-01-28 NOTE — Tx Team (Addendum)
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 acetaminophen (TYLENOL) tablet 650 mg  650 mg Oral Q6H PRN Dixon, Rashaun M, NP      . alum & mag hydroxide-simeth (MAALOX/MYLANTA) 200-200-20 MG/5ML suspension 30 mL  30 mL Oral Q4H PRN Dixon, Rashaun M, NP      . buprenorphine-naloxone (SUBOXONE) 2-0.5 mg per SL tablet 1 tablet  1 tablet Sublingual Daily Clapacs, Jackquline Denmark, MD   1 tablet at 01/28/20 0825  . buprenorphine-naloxone (SUBOXONE) 8-2 mg per SL tablet 2 tablet  2 tablet Sublingual Daily Clapacs, Jackquline Denmark, MD   2 tablet at 01/28/20 0825  . citalopram (CELEXA) tablet 20 mg  20 mg Oral Daily Clapacs, John T, MD   20 mg at 01/28/20 0825  . hydrOXYzine (ATARAX/VISTARIL) tablet 25 mg  25 mg Oral TID PRN Jearld Lesch, NP      . magnesium hydroxide (MILK OF MAGNESIA) suspension 30 mL  30 mL Oral Daily PRN Dixon, Rashaun M, NP      . traZODone (DESYREL) tablet 50 mg  50 mg Oral QHS PRN Jearld Lesch, NP       PTA Medications: Medications Prior to Admission  Medication Sig Dispense Refill Last Dose  . buprenorphine (SUBUTEX) 2 MG SUBL SL tablet Place under the tongue daily.       Patient Stressors: Financial difficulties Substance abuse  Patient Strengths: Ability for insight Motivation for treatment/growth  Treatment Modalities: Medication Management, Group therapy, Case management,  1 to 1 session with clinician, Psychoeducation, Recreational  therapy.   Physician Treatment Plan for Primary Diagnosis: Severe recurrent major depression without psychotic features (HCC) Long Term Goal(s): Improvement in symptoms so as ready for discharge Improvement in symptoms so as ready for discharge   Short Term Goals: Ability to verbalize feelings will improve Ability to disclose and discuss suicidal ideas Compliance with prescribed medications will improve Ability to identify triggers associated with substance abuse/mental health issues will improve  Medication Management: Evaluate patient's response, side effects, and tolerance of medication regimen.  Therapeutic Interventions: 1 to 1 sessions, Unit Group sessions and Medication administration.  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  Therapeutic Interventions: 1 to 1 sessions, Unit Group sessions and Medication administration.  Evaluation of Outcomes: Progressing   RN Treatment Plan for Primary Diagnosis: Severe recurrent major depression without psychotic features (HCC) Long Term Goal(s): Knowledge of disease and therapeutic regimen to  maintain health will improve  Short Term Goals: Ability to remain free from injury will improve,  Ability to demonstrate self-control and Compliance with prescribed medications will improve  Medication Management: RN will administer medications as ordered by provider, will assess and evaluate patient's response and provide education to patient for prescribed medication. RN will report any adverse and/or side effects to prescribing provider.  Therapeutic Interventions: 1 on 1 counseling sessions, Psychoeducation, Medication administration, Evaluate responses to treatment, Monitor vital signs and CBGs as ordered, Perform/monitor CIWA, COWS, AIMS and Fall Risk screenings as ordered, Perform wound care treatments as ordered.  Evaluation of Outcomes: Progressing   LCSW Treatment Plan for Primary Diagnosis: Severe recurrent major depression without psychotic features ([place]) Long Term Goal(s): Safe transition to appropriate next level of care at discharge, Engage patient in therapeutic group addressing interpersonal concerns.  Short Term Goals: Engage patient in aftercare planning with referrals and resources, Increase social support, Identify triggers associated with mental health/substance abuse issues and Increase skills for wellness and recovery  Therapeutic Interventions: Assess for all discharge needs, 1 to 1 time with Social worker, Explore available resources and support systems, Assess for adequacy in community support network, Educate family and significant other(s) on suicide prevention, Complete Psychosocial Assessment, Interpersonal group therapy.  Evaluation of Outcomes: Progressing   Progress in Treatment: Attending groups: Yes. Participating in groups: Yes. Taking medication as prescribed: Yes. Toleration medication: Yes. Family/Significant other contact made: No, will contact:  pt declined consent Patient understands diagnosis: Yes. Discussing patient identified problems/goals with staff: Yes. Medical problems stabilized or resolved: Yes. Denies suicidal/homicidal ideation:  Yes. Issues/concerns per patient self-inventory: No. Other: N/A  New problem(s) identified: No, Describe:  none  New Short Term/Long Term Goal(s): Detox, elimination of AVH/symptoms of psychosis, medication management for mood stabilization; elimination of SI thoughts; development of comprehensive mental wellness/sobriety plan.   Patient Goals:  "To get myself and anxiety in line"  Discharge Plan or Barriers: SPE pamphlet, Mobile Crisis information, and AA/NA information provided to patient for additional community support and resources. [facility] and [facility] referrals sent  Reason for Continuation of Hospitalization: Depression Medication stabilization  Estimated Length of Stay: 5-7 days  Recreational Therapy: Patient Stressors: N/A Patient Goal: Patient will engage in groups without prompting or encouragement from LRT x3 group sessions within 5 recreation therapy group sessions  Attendees: Patient: Jayin Derousse 01/28/2020 1:47 PM  Physician: Dr Weber Cooks MD 01/28/2020 1:47 PM  Nursing: Lyda Kalata, RN 01/28/2020 1:47 PM  RN Care Manager: 01/28/2020 1:47 PM  Social Worker: Minette Brine Moton LCSW 01/28/2020 1:47 PM  Recreational Therapist: Roanna Epley CTRS LRT 01/28/2020 1:47 PM  Other:  01/28/2020 1:47 PM  Other:  01/28/2020 1:47 PM  Other: 01/28/2020 1:47 PM    Scribe for Treatment Team: Mariann Laster Moton, LCSW 01/28/2020 1:47 PM

## 2020-01-29 MED ORDER — NICOTINE 21 MG/24HR TD PT24
21.0000 mg | MEDICATED_PATCH | Freq: Every day | TRANSDERMAL | Status: DC
  Administered 2020-01-29 – 2020-02-03 (×6): 21 mg via TRANSDERMAL
  Filled 2020-01-29 (×6): qty 1

## 2020-01-29 NOTE — Plan of Care (Signed)
Patient aware of [facility] , able to verbalize understanding of information received . Emotional and mental status improved . Voice no concerns around safety . Working on coping , decision making and anxiety.  Voice of no safety concerns . Denies suicidal  ideations . Problem: Education: Goal: Knowledge of [place] [facility] Education information/materials will improve Outcome: Progressing Goal: Emotional status will improve Outcome: Progressing Goal: Mental status will improve Outcome: Progressing Goal: Verbalization of understanding the information provided will improve Outcome: Progressing   Problem: Safety: Goal: Periods of time without injury will increase Outcome: Progressing   Problem: Education: Goal: Ability to make informed decisions regarding treatment will improve Outcome: Progressing   Problem: Self-Concept: Goal: Ability to disclose and discuss suicidal ideas will improve Outcome: Progressing Goal: Will verbalize positive feelings about self Outcome: Progressing   

## 2020-01-29 NOTE — BHH Counselor (Signed)
CSW received call from [facility] at [facility], requesting the following information:  -letter from maintenance provider that they will accept the patient back following his discharge from the [facility] program, letter is to include the days/hours that the the provider is present at the pt's maintenance facility because [facility] doesn't provide bridge prescriptions  -most recent progress notes  -review of the [place] Controlled Reporting System  CSW was asked to provide the following and a decision can be made in regards to the patient's care.  CSW has obtained the most recent progress note and been provided the [place] Cont Controlled reporting System document. CSW has contacted [facility] to request he letter, however, pt's therapist is out for the day and CSW should receive a call back if another provider can provide assistance.  Penni Homans, MSW, LCSW 01/29/2020 11:55 AM

## 2020-01-29 NOTE — Progress Notes (Signed)
Recreation Therapy Notes  Date: 01/29/2020  Time: 9:30 am   Location: Craft room   Behavioral response: N/A   Intervention Topic: Problem Solving   Discussion/Intervention: Patient did not attend group.   Clinical Observations/Feedback:  Patient did not attend group.   Lamika Connolly LRT/CTRS        Kinzley Savell 01/29/2020 11:35 AM 

## 2020-01-29 NOTE — Progress Notes (Signed)
[facility] MD Progress Note  01/29/2020 9:48 AM Nathaniel Hansen  MRN:  [id number] Subjective: Patient seen and chart reviewed. He is observed lying in bed after 11am. He states " O am just a little tired this morning. " He is appropriate and engages well with writer by sitting up on the bed and beginning to get ready to start his day. He denies any withdrawal symptoms, cravings or urges to uses. He seems motivated to complete treatment and interested in seeking all options. He denies going to any groups as he was resting this morning but is looking forward to this afternoon groups. He reports sleeping well and eating well. Denies any si/hi/avh. He is being referred to [facility] and [facility], paperwork in process. He is taking citalopram 20mg  po daily for depression, Hydroxyzine 25mg  po TID prn for anxiety, and Trazodone 50mg  po qhs prn for insomnia. He is receiving Suboxone 18 mg which has been restarted. He appears to be tolerating all the medications well at this time.    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 Anxiety   . Asthma   . Cocaine abuse (HCC)   . Endocarditis   . Opioid abuse (HCC)    History reviewed. No pertinent surgical history. Family History: History reviewed. No pertinent family history. Family Psychiatric  History: See previous Social History:  Social History   Substance and Sexual Activity  Alcohol Use Yes   Comment: sometimes     Social History   Substance and Sexual Activity  Drug Use Yes  . Types: Marijuana, Heroin   Comment:  "sometimes" opioids, heroin     Social History   Socioeconomic History  . Marital status: Single    Spouse name: Not on file  . Number of children: Not on file  . Years of education: Not on file  . Highest education level: Not on file  Occupational History  . Not on file  Tobacco Use  . Smoking status: Current Every Day Smoker    Packs/day: 1.00    Types: Cigarettes  . Smokeless tobacco: Never Used  Substance and Sexual Activity  . Alcohol use: Yes    Comment: sometimes  . Drug use: Yes    Types: Marijuana, Heroin    Comment: "sometimes" opioids, heroin   . Sexual activity: Not on file  Other Topics Concern  . Not on file  Social History Narrative  . Not on file   Social Determinants of Health   Financial Resource Strain:   . Difficulty of Paying Living Expenses: Not on file  Food Insecurity:   . Worried About in the Last Year: Not on file  . Ran Out of Food in the Last Year: Not on file  Transportation Needs:   . Lack of Transportation (Medical): Not on file  . Lack of Transportation (Non-Medical): Not on file  Physical Activity:   . Days of Exercise per Week: Not on file  . Minutes of Exercise per Session: Not on file  Stress:   . Feeling of Stress : Not on file  Social Connections:   . Frequency  of Communication with Friends and Family: Not on file  . Frequency of Social Gatherings with Friends and Family: Not on file  . Attends Religious Services: Not on file  . Active Member of Clubs or Organizations: Not on file  . Attends Archivist Meetings: Not on file  . Marital Status: Not on file   Additional Social History:                         Sleep: Fair  Appetite:  Fair  Current Medications: Current Facility-Administered Medications  Medication Dose Route Frequency Provider Last Rate Last Admin  . acetaminophen (TYLENOL) tablet 650 mg  650 mg Oral Q6H PRN Dixon, Rashaun M, NP      . alum & mag hydroxide-simeth  (MAALOX/MYLANTA) 200-200-20 MG/5ML suspension 30 mL  30 mL Oral Q4H PRN Dixon, Rashaun M, NP      . buprenorphine-naloxone (SUBOXONE) 2-0.5 mg per SL tablet 1 tablet  1 tablet Sublingual Daily Clapacs, Madie Reno, MD   1 tablet at 01/29/20 0804  . buprenorphine-naloxone (SUBOXONE) 8-2 mg per SL tablet 2 tablet  2 tablet Sublingual Daily Clapacs, Madie Reno, MD   2 tablet at 01/29/20 0804  . citalopram (CELEXA) tablet 20 mg  20 mg Oral Daily Clapacs, Madie Reno, MD   20 mg at 01/29/20 0804  . hydrOXYzine (ATARAX/VISTARIL) tablet 25 mg  25 mg Oral TID PRN Deloria Lair, NP   25 mg at 01/28/20 2133  . magnesium hydroxide (MILK OF MAGNESIA) suspension 30 mL  30 mL Oral Daily PRN Dixon, Rashaun M, NP      . traZODone (DESYREL) tablet 50 mg  50 mg Oral QHS PRN Deloria Lair, NP        Lab Results: No results found for this or any previous visit (from the past 48 hour(s)).  Blood Alcohol level:  Lab Results  Component Value Date   ETH <10 01/25/2020   ETH <10 86/57/8469    Metabolic Disorder Labs: No results found for: HGBA1C, MPG No results found for: PROLACTIN No results found for: CHOL, TRIG, HDL, CHOLHDL, VLDL, LDLCALC  Physical Findings: AIMS:  , ,  ,  ,    CIWA:    COWS:     Musculoskeletal: Strength & Muscle Tone: within normal limits Gait & Station: normal Patient leans: N/A  Psychiatric Specialty Exam: Physical Exam  Nursing note and vitals reviewed. Constitutional: He appears well-developed and well-nourished.  HENT:  Head: Normocephalic and atraumatic.  Eyes: Pupils are equal, round, and reactive to light. Conjunctivae are normal.  Cardiovascular: Regular rhythm and normal heart sounds.  Respiratory: Effort normal.  GI: Soft.  Musculoskeletal:        General: Normal range of motion.     Cervical back: Normal range of motion.  Neurological: He is alert.  Skin: Skin is warm and dry.  Psychiatric: He has a normal mood and affect. His speech is normal and behavior is normal.  Judgment and thought content normal. Cognition and memory are normal.    Review of Systems  Constitutional: Negative.   HENT: Negative.   Eyes: Negative.   Respiratory: Negative.   Cardiovascular: Negative.   Gastrointestinal: Negative.   Musculoskeletal: Negative.   Skin: Negative.   Neurological: Negative.   Psychiatric/Behavioral: Negative.     Blood pressure 111/76, pulse (!) 52, temperature 98 F (36.7 C), temperature source Oral, resp. rate 18, height 6' (1.829 m), weight 75.8 kg, SpO2 100 %.  Body mass index is 22.66 kg/m.  General Appearance: Casual  Eye Contact:  Good  Speech:  Clear and Coherent  Volume:  Normal  Mood:  Euthymic  Affect:  Constricted  Thought Process:  Coherent  Orientation:  Full (Time, Place, and Person)  Thought Content:  Logical  Suicidal Thoughts:  No  Homicidal Thoughts:  No  Memory:  Immediate;   Fair Recent;   Fair Remote;   Fair  Judgement:  Fair  Insight:  Fair  Psychomotor Activity:  Decreased  Concentration:  Concentration: Fair  Recall:  [employer] of Knowledge:  Fair  Language:  Fair  Akathisia:  No  Handed:  Right  AIMS (if indicated):     Assets:  Desire for Improvement Housing Physical Health Resilience  ADL's:  Intact  Cognition:  WNL  Sleep:  Number of Hours: 6.75     Treatment Plan Summary: Daily contact with patient to assess and evaluate symptoms and progress in treatment, Medication management and Plan No change to medicine.  Continue citalopram.  Trazodone as needed.  Encourage group attendance.  Probably looking at length of stay 1-2 more days  Maryagnes Amos, FNP 01/29/2020, 9:48 AM

## 2020-01-29 NOTE — BHH Group Notes (Signed)
B[facility] Group Notes:  (Nursing/MHT/Case Management/Adjunct)  Date:  01/29/2020  Time:  10:24 AM  Type of Therapy:  Community Meeting  Participation Level:  Did Not Attend   Summary of Progress/Problems:  Kerrie Pleasure 01/29/2020, 10:24 AM

## 2020-01-29 NOTE — BHH Group Notes (Signed)
LCSW Group Therapy Note  01/29/2020 11:29 AM  Type of Therapy/Topic:  Group Therapy:  Emotion Regulation  Participation Level:  Active   Description of Group:   The purpose of this group is to assist patients in learning to regulate negative emotions and experience positive emotions. Patients will be guided to discuss ways in which they have been vulnerable to their negative emotions. These vulnerabilities will be juxtaposed with experiences of positive emotions or situations, and patients will be challenged to use positive emotions to combat negative ones. Special emphasis will be placed on coping with negative emotions in conflict situations, and patients will process healthy conflict resolution skills.  Therapeutic Goals: 1. Patient will identify two positive emotions or experiences to reflect on in order to balance out negative emotions 2. Patient will label two or more emotions that they find the most difficult to experience 3. Patient will demonstrate positive conflict resolution skills through discussion and/or role plays  Summary of Patient Progress: Pt was appropriate and respectful in group. Pt was able to identify different emotions and offer suggestions to other group members while in the group setting. Pt discussed coping skills and appropriate reactions to different situations.   Therapeutic Modalities:   Cognitive Behavioral Therapy Feelings Identification Dialectical Behavioral Therapy   Nathaniel Hansen, MSW, LCSW Clinical Social Work 01/29/2020 11:29 AM

## 2020-01-29 NOTE — Progress Notes (Signed)
Recreation Therapy Notes  INPATIENT RECREATION THERAPY ASSESSMENT  Patient Details Name: Nathaniel Hansen MRN: [id number] DOB: [birth year] Today's Date: 01/29/2020       Information Obtained From: Patient  Able to Participate in Assessment/Interview: Yes  Patient Presentation: Responsive  Reason for Admission (Per Patient): Active Symptoms, Suicidal Ideation  Patient Stressors:    Coping Skills:   Substance Abuse, Talk, Prayer  Leisure Interests (2+):  Sports - Basketball, Social - Friends, Technical brewer - Ambulance person of Recreation/Participation:    Biochemist, clinical Resources:     [employer]:     Current Use:    If no, Barriers?:    Expressed Interest in [employer] Information:    [place] of Residence:  Designer, multimedia  Patient Main Form of Transportation: Therapist, music  Patient Strengths:  Outgoing  Patient Identified Areas of Improvement:  Anxiety  Patient Goal for Hospitalization:  Improve my anxiety  Current SI (including self-harm):  No  Current HI:  No  Current AVH: No  Staff Intervention Plan: Group Attendance, Collaborate with Interdisciplinary Treatment Team  Consent to Intern Participation: N/A  Sadiya Durand 01/29/2020, 3:41 PM

## 2020-01-29 NOTE — Progress Notes (Signed)
D: Major Depression   A: Affect cheerful on approach. Patient did not attend all am groups this am ,  Stated he wanted to get closer to his son . Working on goals stated the main one is to stay away from places that he use to shoot up at . D: Patient stated slept fair last night .Stated appetite fair and energy level  Good. Stated concentration is good . Stated on Depression scale 3 , hopeless 3 and anxiety 3 .( low 0-10 high) Denies suicidal  homicidal ideations  .  No auditory hallucinations  No pain concerns . Appropriate ADL'S. Interacting with peers and staff.  Encourage patient participation with unit programming . Instruction  Given on  Medication , verbalize understanding.  R: Voice no other concerns. Staff continue to monitor

## 2020-01-30 NOTE — [facility] Group Notes (Signed)
[facility] Group Notes:  (Nursing/MHT/Case Management/Adjunct)  Date:  01/30/2020  Time:  3:05 AM  Type of Therapy:  Group Therapy  Participation Level:  Active  Participation Quality:  Appropriate and Supportive  Affect:  Appropriate  Cognitive:  Alert and Appropriate  Insight:  Appropriate and Good  Engagement in Group:  Developing/Improving, Engaged and Supportive  Modes of Intervention:  Discussion and Education  Summary of Progress/Problems: PT wants to go to Rehab. He wants to clean his slate of drug usage, get a job and establish stability in his life  Landry Mellow 01/30/2020, 3:05 AM

## 2020-01-30 NOTE — Progress Notes (Signed)
Recreation Therapy Notes  Date: 01/30/2020  Time: 9:30 am  Location: Outside court yard    Behavioral response: Appropriate   Intervention Topic: Animal Assisted Therapy   Discussion/Intervention:  Animal Assisted Therapy took place today during group.  Animal Assisted Therapy is the planned inclusion of an animal in a patient's treatment plan. The patients were able to engage in therapy with an animal during group. Participants were educated on what a service dog is and the different between a support dog and a service dog. Patient were informed on the many animal needs there are and how their needs are similar. Individuals were enlightened on the process to get a service animal or support animal. Patients got the opportunity to pet the animal and were offered emotional support from the animal and staff.  Clinical Observations/Feedback:  Patient came to group and was engaged in group. Participant was focused and asked appropriate questions regarding the group topic and task. Individual was social with peers and staff while participating in group.  Kallista Pae LRT/CTRS         Beola Vasallo 01/30/2020 11:17 AM

## 2020-01-30 NOTE — Progress Notes (Signed)
Patient alert and oriented x 4, affect is bright on approach, his thoughts are organized and coherent he is interacting appropriately with peers and staff, no distress noted,  he was complaint with medication regimen. 15 minutes safety checks maintained will continue to monitor.

## 2020-01-30 NOTE — Progress Notes (Signed)
[facility] MD Progress Note  01/30/2020 4:19 PM Nathaniel Hansen  MRN:  [id number] Subjective: Follow-up for this patient with depression and substance abuse.  Patient says he is feeling significantly better.  More hopeful.  He is expressing a strong desire to go to a substance abuse rehab program and a strong desire to stay clean when he gets out of the hospital.  Denies any current suicidal ideation.  Physically feeling better.  Eating well.  Interacts well with others and attends groups. 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  Recurrent depression.  Past Medical History:  Past Medical History:  Diagnosis Date  . Anxiety   . Asthma   . Cocaine abuse (HCC)   . Endocarditis   . Opioid abuse (HCC)    History reviewed. No pertinent surgical history. Family History: History reviewed. No pertinent family history. Family Psychiatric  History: See previous Social History:  Social History   Substance and Sexual Activity  Alcohol Use Yes   Comment: sometimes     Social History   Substance and Sexual Activity  Drug Use Yes  . Types: Marijuana, Heroin   Comment: "sometimes" opioids, heroin     Social History   Socioeconomic History  . Marital status: Single    Spouse name: Not on file  . Number of children: Not on file  . Years of education: Not on file  . Highest education level: Not on file  Occupational History  . Not on file  Tobacco Use  . Smoking status: Current Every Day Smoker    Packs/day: 1.00    Types: Cigarettes  . Smokeless tobacco: Never Used  Substance and  Sexual Activity  . Alcohol use: Yes    Comment: sometimes  . Drug use: Yes    Types: Marijuana, Heroin    Comment: "sometimes" opioids, heroin   . Sexual activity: Not on file  Other Topics Concern  . Not on file  Social History Narrative  . Not on file   Social Determinants of Health   Financial Resource Strain:   . Difficulty of Paying Living Expenses:   Food Insecurity:   . Worried About Programme researcher, broadcasting/film/video in the Last Year:   . Barista in the Last Year:   Transportation Needs:   . Freight forwarder (Medical):   Marland Kitchen Lack of Transportation (Non-Medical):   Physical Activity:   . Days of Exercise per Week:   . Minutes of Exercise per Session:   Stress:   . Feeling of Stress :   Social Connections:   . Frequency of Communication with Friends and Family:   . Frequency of Social Gatherings with Friends and Family:   . Attends Religious Services:   . Active Member of Clubs or Organizations:   . Attends Banker Meetings:   Marland Kitchen Marital Status:    Additional Social History:                         Sleep: Poor  Appetite:  Fair  Current Medications: Current Facility-Administered Medications  Medication Dose Route Frequency Provider Last Rate Last  Admin  . acetaminophen (TYLENOL) tablet 650 mg  650 mg Oral Q6H PRN Dixon, Rashaun M, NP      . alum & mag hydroxide-simeth (MAALOX/MYLANTA) 200-200-20 MG/5ML suspension 30 mL  30 mL Oral Q4H PRN Dixon, Rashaun M, NP      . buprenorphine-naloxone (SUBOXONE) 2-0.5 mg per SL tablet 1 tablet  1 tablet Sublingual Daily Kavonte Bearse, Jackquline Denmark, MD   1 tablet at 01/30/20 0998  . buprenorphine-naloxone (SUBOXONE) 8-2 mg per SL tablet 2 tablet  2 tablet Sublingual Daily Lenox Bink, Jackquline Denmark, MD   2 tablet at 01/30/20 [phone]  . citalopram (CELEXA) tablet 20 mg  20 mg Oral Daily Tylee Yum T, MD   20 mg at 01/30/20 5053  . hydrOXYzine (ATARAX/VISTARIL) tablet 25 mg  25 mg Oral TID PRN Jearld Lesch, NP   25 mg at  01/29/20 2103  . magnesium hydroxide (MILK OF MAGNESIA) suspension 30 mL  30 mL Oral Daily PRN Dixon, Rashaun M, NP      . nicotine (NICODERM CQ - dosed in mg/24 hours) patch 21 mg  21 mg Transdermal Daily Jimmie Dattilio, Jackquline Denmark, MD   21 mg at 01/30/20 0823  . traZODone (DESYREL) tablet 50 mg  50 mg Oral QHS PRN Jearld Lesch, NP   50 mg at 01/29/20 2103    Lab Results: No results found for this or any previous visit (from the past 48 hour(s)).  Blood Alcohol level:  Lab Results  Component Value Date   ETH <10 01/25/2020   ETH <10 01/25/2020    Metabolic Disorder Labs: No results found for: HGBA1C, MPG No results found for: PROLACTIN No results found for: CHOL, TRIG, HDL, CHOLHDL, VLDL, LDLCALC  Physical Findings: AIMS:  , ,  ,  ,    CIWA:    COWS:     Musculoskeletal: Strength & Muscle Tone: within normal limits Gait & Station: normal Patient leans: N/A  Psychiatric Specialty Exam: Physical Exam  Nursing note and vitals reviewed. Constitutional: He appears well-developed and well-nourished.  HENT:  Head: Normocephalic and atraumatic.  Eyes: Pupils are equal, round, and reactive to light. Conjunctivae are normal.  Cardiovascular: Regular rhythm and normal heart sounds.  Respiratory: Effort normal. No respiratory distress.  GI: Soft.  Musculoskeletal:        General: Normal range of motion.     Cervical back: Normal range of motion.  Neurological: He is alert.  Skin: Skin is warm and dry.  Psychiatric: He has a normal mood and affect. His speech is normal and behavior is normal. Judgment and thought content normal. Cognition and memory are normal.    Review of Systems  Constitutional: Negative.   HENT: Negative.   Eyes: Negative.   Respiratory: Negative.   Cardiovascular: Negative.   Gastrointestinal: Negative.   Musculoskeletal: Negative.   Skin: Negative.   Neurological: Negative.   Psychiatric/Behavioral: Negative.     Blood pressure (!) 118/56, pulse (!) 55,  temperature 98.4 F (36.9 C), temperature source Oral, resp. rate 17, height 6' (1.829 m), weight 75.8 kg, SpO2 100 %.Body mass index is 22.66 kg/m.  General Appearance: Casual  Eye Contact:  Fair  Speech:  Clear and Coherent  Volume:  Normal  Mood:  Euthymic  Affect:  Congruent  Thought Process:  Goal Directed  Orientation:  Full (Time, Place, and Person)  Thought Content:  Logical  Suicidal Thoughts:  No  Homicidal Thoughts:  No  Memory:  Immediate;   Fair Recent;  Fair Remote;   Fair  Judgement:  Fair  Insight:  Fair  Psychomotor Activity:  Normal  Concentration:  Concentration: Fair  Recall:  [employer] of Knowledge:  Fair  Language:  Fair  Akathisia:  No  Handed:  Right  AIMS (if indicated):     Assets:  Communication Skills Desire for Improvement Physical Health Resilience Social Support  ADL's:  Intact  Cognition:  WNL  Sleep:  Number of Hours: 7.5     Treatment Plan Summary: Daily contact with patient to assess and evaluate symptoms and progress in treatment, Medication management and Plan Application has been made to inpatient rehab programs and we are waiting for replies.  Continue current medication and encourage patient in his efforts to attend group and maintain positive focus on the future.  Alethia Berthold, MD 01/30/2020, 4:19 PM

## 2020-01-30 NOTE — Plan of Care (Signed)
Patient aware of [facility] , able to verbalize understanding of information received . Emotional and mental status improved . Voice no concerns around safety . Working on coping , decision making and anxiety.  Voice of no safety concerns . Denies suicidal  ideations . Problem: Education: Goal: Knowledge of [place] [facility] Education information/materials will improve Outcome: Progressing Goal: Emotional status will improve Outcome: Progressing Goal: Mental status will improve Outcome: Progressing Goal: Verbalization of understanding the information provided will improve Outcome: Progressing   Problem: Safety: Goal: Periods of time without injury will increase Outcome: Progressing   Problem: Education: Goal: Ability to make informed decisions regarding treatment will improve Outcome: Progressing   Problem: Self-Concept: Goal: Ability to disclose and discuss suicidal ideas will improve Outcome: Progressing Goal: Will verbalize positive feelings about self Outcome: Progressing

## 2020-01-30 NOTE — B[facility] Counselor (Signed)
CSW faxed the letter to [facility]. CSW received confirmation the fax was successful.    Penni Homans, MSW, LCSW 01/30/2020 12:50 PM

## 2020-01-30 NOTE — [facility] Counselor (Signed)
CSW called [facility] [phone], again, in an effort to obtain the letter for this patient stating if the provider would accept patient back once the program with [facility] had been completed.   CSW was informed that the provider will be contacted and the letter faxed.    Letter is a requirement for consideration for [facility].  Penni Homans, MSW, LCSW 01/30/2020 10:49 AM

## 2020-01-30 NOTE — [facility] Group Notes (Signed)
LCSW Group Therapy Note  01/30/2020 1:00 PM  Type of Therapy/Topic:  Group Therapy:  Balance in Life  Participation Level:  Active  Description of Group:    This group will address the concept of balance and how it feels and looks when one is unbalanced. Patients will be encouraged to process areas in their lives that are out of balance and identify reasons for remaining unbalanced. Facilitators will guide patients in utilizing problem-solving interventions to address and correct the stressor making their life unbalanced. Understanding and applying boundaries will be explored and addressed for obtaining and maintaining a balanced life. Patients will be encouraged to explore ways to assertively make their unbalanced needs known to significant others in their lives, using other group members and facilitator for support and feedback.  Therapeutic Goals: 1. Patient will identify two or more emotions or situations they have that consume much of in their lives. 2. Patient will identify signs/triggers that life has become out of balance:  3. Patient will identify two ways to set boundaries in order to achieve balance in their lives:  4. Patient will demonstrate ability to communicate their needs through discussion and/or role plays  Summary of Patient Progress: Patient was present in group. Patient was an active participant.  Patient shared how he feels unbalanced and his plans on addressing the issues so that he can become more balanced.    Therapeutic Modalities:   Cognitive Behavioral Therapy Solution-Focused Therapy Assertiveness Training  Penni Homans MSW, LCSW 01/30/2020 12:36 PM

## 2020-01-30 NOTE — Plan of Care (Signed)
Patient aware of [facility] , able to verbalize understanding of information received . Emotional and mental status improved . Voice no concerns around safety . Working on coping , decision making and anxiety. Voice of no safety concerns . Denies suicidal ideations  Problem: Education: Goal: Knowledge of [place] [facility] Education information/materials will improve 01/30/2020 1443 by Crist Infante, RN Outcome: Progressing 01/30/2020 1043 by Crist Infante, RN Outcome: Progressing Goal: Emotional status will improve 01/30/2020 1443 by Crist Infante, RN Outcome: Progressing 01/30/2020 1043 by Crist Infante, RN Outcome: Progressing Goal: Mental status will improve 01/30/2020 1443 by Crist Infante, RN Outcome: Progressing 01/30/2020 1043 by Crist Infante, RN Outcome: Progressing Goal: Verbalization of understanding the information provided will improve 01/30/2020 1443 by Crist Infante, RN Outcome: Progressing 01/30/2020 1043 by Crist Infante, RN Outcome: Progressing   Problem: Safety: Goal: Periods of time without injury will increase 01/30/2020 1443 by Crist Infante, RN Outcome: Progressing 01/30/2020 1043 by Crist Infante, RN Outcome: Progressing   Problem: Education: Goal: Ability to make informed decisions regarding treatment will improve 01/30/2020 1443 by Crist Infante, RN Outcome: Progressing 01/30/2020 1043 by Crist Infante, RN Outcome: Progressing   Problem: Self-Concept: Goal: Ability to disclose and discuss suicidal ideas will improve 01/30/2020 1443 by Crist Infante, RN Outcome: Progressing 01/30/2020 1043 by Crist Infante, RN Outcome: Progressing Goal: Will verbalize positive feelings about self 01/30/2020 1443 by Crist Infante, RN Outcome: Progressing 01/30/2020 1043 by Crist Infante, RN Outcome: Progressing   Problem: Safety: Goal: Periods of time without injury will increase 01/30/2020 1443 by Crist Infante, RN Outcome:  Progressing 01/30/2020 1043 by Crist Infante, RN Outcome: Progressing

## 2020-01-30 NOTE — Progress Notes (Signed)
Pt is alert and oriented to person, place, time, and situation. Pt is calm, cooperative, denies suicidal and homicidal ideation, denies hallucinations, denies feelings of depression and anxiety. Pt has a bright affect, mood is content, pt has been attending group, social with staff and peers, visible, appetite and sleep are reported as good. Pt is medication compliant. No distress noted or reported. Will continue to monitor pt per Q15 minute face checks and monitor for safety and progress.

## 2020-01-30 NOTE — Progress Notes (Signed)
BRIEF PHARMACY NOTE   This patient attended and participated in Medication Management Group counseling led by [facility] staff pharmacist.  This interactive class reviews basic information about prescription medications and education on personal responsibility in medication management.  The class also includes general knowledge of 3 main classes of behavioral medications, including antipsychotics, antidepressants, and mood stabilizers.     Patient behavior was appropriate for group setting.   Educational materials sourced from:  "Medication Do's and Don'ts" from [email or web address]   "Mental Health Medications" from [facility] of Mental Health FaxRack.tn.shtml#part [email or web address]    Albina Billet, PharmD, BCPS Clinical Pharmacist 01/30/2020 2:53 PM

## 2020-01-31 NOTE — B[facility] Group Notes (Signed)
[facility] Group Notes:  (Nursing/MHT/Case Management/Adjunct)  Date:  01/31/2020  Time:  3:07 PM  Type of Therapy:  Music Group  Participation Level:  Active  Participation Quality:  Appropriate and Attentive  Affect:  Appropriate  Cognitive:  Alert and Appropriate  Insight:  Appropriate  Engagement in Group:  Engaged  Modes of Intervention:  Activity  Summary of Progress/Problems:  Nathaniel Hansen 01/31/2020, 3:07 PM

## 2020-01-31 NOTE — Progress Notes (Signed)
[facility] MD Progress Note  01/31/2020 11:22 AM Nathaniel Hansen  MRN:  [id number]   Subjective: Follow-up for this 34 year old male diagnosed with MDD severe recurrent.  Patient reports that he is doing very well today.  He states he has no concerns or complaints today.  Patient denies any suicidal homicidal ideations and denies any hallucinations.  He states that he did find out that he was accepted at [facility] and is waiting to hear from the social worker about when he would have a bed.  He continues reporting that he hopes to be able to go next week.  But he also states that he will be willing to go to the facility once his bed is available.  Patient reports that he feels that going straight there from the hospital will be the best for him as he would maintain stability.  Patient reports sleeping well and also having a good appetite.  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  Currently involved in outpatient opiate dependence treatment with Suboxone.  No known previous hospitalizations.  Unclear if he has ever been on any other psychiatric medicine  Past Medical History:  Past Medical History:  Diagnosis Date  . Anxiety   . Asthma   . Cocaine abuse (HCC)   . Endocarditis   . Opioid abuse (HCC)    History reviewed. No pertinent surgical history. Family History: History reviewed. No pertinent family history. Family Psychiatric  History: None reported Social History:  Social History   Substance and Sexual Activity  Alcohol Use Yes   Comment: sometimes     Social History   Substance and Sexual  Activity  Drug Use Yes  . Types: Marijuana, Heroin   Comment: "sometimes" opioids, heroin     Social History   Socioeconomic History  . Marital status: Single    Spouse name: Not on file  . Number of children: Not on file  . Years of education: Not on file  . Highest education level: Not on file  Occupational History  . Not on file  Tobacco Use  . Smoking status: Current Every Day Smoker    Packs/day: 1.00    Types: Cigarettes  . Smokeless tobacco: Never Used  Substance and Sexual Activity  . Alcohol use: Yes    Comment: sometimes  . Drug use: Yes    Types: Marijuana, Heroin    Comment: "sometimes" opioids, heroin   . Sexual activity: Not on file  Other Topics Concern  . Not on file  Social History Narrative  . Not on file   Social Determinants of Health   Financial Resource Strain:   . Difficulty of Paying Living Expenses:   Food Insecurity:   . Worried About Programme researcher, broadcasting/film/video in the Last Year:   . Barista in the Last Year:   Transportation Needs:   . Freight forwarder (Medical):   Marland Kitchen Lack of Transportation (Non-Medical):   Physical Activity:   . Days of Exercise per Week:   . Minutes of Exercise per Session:   Stress:   . Feeling of Stress :   Social Connections:   . Frequency of Communication  with Friends and Family:   . Frequency of Social Gatherings with Friends and Family:   . Attends Religious Services:   . Active Member of Clubs or Organizations:   . Attends Archivist Meetings:   Marland Kitchen Marital Status:    Additional Social History:                         Sleep: Good  Appetite:  Good  Current Medications: Current Facility-Administered Medications  Medication Dose Route Frequency Provider Last Rate Last Admin  . acetaminophen (TYLENOL) tablet 650 mg  650 mg Oral Q6H PRN Dixon, Rashaun M, NP      . alum & mag hydroxide-simeth (MAALOX/MYLANTA) 200-200-20 MG/5ML suspension 30 mL  30 mL Oral Q4H PRN Dixon, Rashaun M,  NP      . buprenorphine-naloxone (SUBOXONE) 2-0.5 mg per SL tablet 1 tablet  1 tablet Sublingual Daily Clapacs, Madie Reno, MD   1 tablet at 01/31/20 [phone]  . buprenorphine-naloxone (SUBOXONE) 8-2 mg per SL tablet 2 tablet  2 tablet Sublingual Daily Clapacs, Madie Reno, MD   2 tablet at 01/31/20 0807  . citalopram (CELEXA) tablet 20 mg  20 mg Oral Daily Clapacs, Madie Reno, MD   20 mg at 01/31/20 0809  . hydrOXYzine (ATARAX/VISTARIL) tablet 25 mg  25 mg Oral TID PRN Deloria Lair, NP   25 mg at 01/30/20 2108  . magnesium hydroxide (MILK OF MAGNESIA) suspension 30 mL  30 mL Oral Daily PRN Dixon, Rashaun M, NP      . nicotine (NICODERM CQ - dosed in mg/24 hours) patch 21 mg  21 mg Transdermal Daily Clapacs, Madie Reno, MD   21 mg at 01/31/20 0811  . traZODone (DESYREL) tablet 50 mg  50 mg Oral QHS PRN Deloria Lair, NP   50 mg at 01/30/20 2108    Lab Results: No results found for this or any previous visit (from the past 48 hour(s)).  Blood Alcohol level:  Lab Results  Component Value Date   ETH <10 01/25/2020   ETH <10 78/29/5621    Metabolic Disorder Labs: No results found for: HGBA1C, MPG No results found for: PROLACTIN No results found for: CHOL, TRIG, HDL, CHOLHDL, VLDL, LDLCALC  Physical Findings: AIMS:  , ,  ,  ,    CIWA:    COWS:     Musculoskeletal: Strength & Muscle Tone: within normal limits Gait & Station: normal Patient leans: N/A  Psychiatric Specialty Exam: Physical Exam  Nursing note and vitals reviewed. Constitutional: He is oriented to person, place, and time. He appears well-developed and well-nourished.  Respiratory: Effort normal.  Musculoskeletal:        General: Normal range of motion.  Neurological: He is alert and oriented to person, place, and time.  Skin: Skin is warm.    Review of Systems  Constitutional: Negative.   HENT: Negative.   Eyes: Negative.   Respiratory: Negative.   Cardiovascular: Negative.   Gastrointestinal: Negative.   Genitourinary:  Negative.   Musculoskeletal: Negative.   Skin: Negative.   Neurological: Negative.   Psychiatric/Behavioral: Negative.     Blood pressure 123/62, pulse (!) 58, temperature 98 F (36.7 C), temperature source Oral, resp. rate 17, height 6' (1.829 m), weight 75.8 kg, SpO2 100 %.Body mass index is 22.66 kg/m.  General Appearance: Casual  Eye Contact:  Good  Speech:  Clear and Coherent and Normal Rate  Volume:  Normal  Mood:  Euthymic  Affect:  Appropriate  Thought Process:  Coherent and Descriptions of Associations: Intact  Orientation:  Full (Time, Place, and Person)  Thought Content:  WDL  Suicidal Thoughts:  No  Homicidal Thoughts:  No  Memory:  Immediate;   Good Recent;   Good Remote;   Good  Judgement:  Fair  Insight:  Fair  Psychomotor Activity:  Normal  Concentration:  Concentration: Fair  Recall:  Fair  Fund of Knowledge:  Fair  Language:  Fair  Akathisia:  No  Handed:  Right  AIMS (if indicated):     Assets:  Communication Skills Desire for Improvement Physical Health  ADL's:  Intact  Cognition:  WNL  Sleep:  Number of Hours: 8   Assessment: Patient presents walking around the milieu and is pleasant, calm, cooperative during the evaluation.  Patient has been attending groups and has been engaging very well per the reports.  Patient has been compliant with medications.  Staff report that the patient has been very cooperative and easygoing while he has been on the unit.  Social worker informed that there will be contacting [facility] at 1:00 to find out on when the bed would be available for the patient.  Hopefully discharge within the next couple of days  Treatment Plan Summary: Daily contact with patient to assess and evaluate symptoms and progress in treatment and Medication management Continue Suboxone 2-0.5 mg tablet sublingual daily and 8-2 mg sublingual 2 tablets daily for substance abuse Continue Celexa 20 mg p.o. daily for depression Continue Vistaril 25 mg 3  times daily as needed for anxiety Continue trazodone 50 mg p.o. nightly as needed for insomnia Encourage group therapy participation Continue every 15 minute safety checks  Gerlene Burdock Logen Fowle, FNP 01/31/2020, 11:22 AM

## 2020-01-31 NOTE — [facility] Group Notes (Signed)
[facility] Group Notes:  (Nursing/MHT/Case Management/Adjunct)  Date:  01/31/2020  Time:  12:34 AM  Type of Therapy:  Group Therapy  Participation Level:  Active  Participation Quality:  Appropriate, Sharing and Supportive  Affect:  Appropriate  Cognitive:  Alert and Appropriate  Insight:  Good  Engagement in Group:  Developing/Improving and Supportive  Modes of Intervention:  Discussion, Education and Support  Summary of Progress/Problems: Pt stated that he wants to do rehab and learn more about the rehabilitation process  Latrenda Irani Carrington Clamp 01/31/2020, 12:34 AM

## 2020-01-31 NOTE — [facility] Group Notes (Signed)
Feelings Around Relapse 01/31/2020 1PM  Type of Therapy and Topic:  Group Therapy:  Feelings around Relapse and Recovery  Participation Level:  Active   Description of Group:    Patients in this group will discuss emotions they experience before and after a relapse. They will process how experiencing these feelings, or avoidance of experiencing them, relates to having a relapse. Facilitator will guide patients to explore emotions they have related to recovery. Patients will be encouraged to process which emotions are more powerful. They will be guided to discuss the emotional reaction significant others in their lives may have to patients' relapse or recovery. Patients will be assisted in exploring ways to respond to the emotions of others without this contributing to a relapse.  Therapeutic Goals: 1. Patient will identify two or more emotions that lead to a relapse for them 2. Patient will identify two emotions that result when they relapse 3. Patient will identify two emotions related to recovery 4. Patient will demonstrate ability to communicate their needs through discussion and/or role plays   Summary of Patient Progress: Actively and appropriately participated in session. Pt demonstrated very good insight and discussed with group his battle with drug addiction. Pt identified a cousin as a positive natural support. He also identified fishing, playing basketball as his healthy coping skills. Pt respected boundaries during session and interacted appropriately with group members.    Therapeutic Modalities:   Cognitive Behavioral Therapy Solution-Focused Therapy Assertiveness Training Relapse Prevention Therapy   Suzan Slick, LCSW 01/31/2020 2:09 PM

## 2020-01-31 NOTE — [facility] Counselor (Signed)
CSW was informed patient accepted into [facility].  Bed is available 02/03/2020 at 1PM.    CSW provided patient with list of things he can bring to [facility].  CSW was informed that pt needs a Covid test within 48 hours of being admitted to [facility].  CSW made psychiatrist and NP aware.  Penni Homans, MSW, LCSW 01/31/2020 1:49 PM

## 2020-01-31 NOTE — Plan of Care (Signed)
Pt rates depression, anxiety and hopelessness at a 1/10. Pt was educated on care plan and verbalizes understanding. Pt was encouraged to attend groups. Torrie Mayers RN Problem: Education: Goal: Charity fundraiser Education information/materials will improve Outcome: Progressing Goal: Emotional status will improve Outcome: Progressing Goal: Mental status will improve Outcome: Progressing Goal: Verbalization of understanding the information provided will improve Outcome: Progressing   Problem: Safety: Goal: Periods of time without injury will increase Outcome: Progressing   Problem: Education: Goal: Ability to make informed decisions regarding treatment will improve Outcome: Progressing   Problem: Self-Concept: Goal: Ability to disclose and discuss suicidal ideas will improve Outcome: Progressing Goal: Will verbalize positive feelings about self Outcome: Progressing

## 2020-01-31 NOTE — Progress Notes (Addendum)
Patient alert and oriented x 4, affect is bright on approach, he is pleasant with staff and polite, his thoughts are organized and coherent he is interacting appropriately with peers and staff, no distress noted,  he was complaint with medication regimen. and he attended evening wrap up group.Patiet was offered emotional support, 15 minutes safety checks maintained will continue to monitor.

## 2020-01-31 NOTE — Progress Notes (Signed)
Pt has been calm, cooperative and very social. He attends all groups and goes outdoors. He has much insight. He also encourages other patients. Torrie Mayers RN

## 2020-01-31 NOTE — [facility] Counselor (Signed)
CSW called to check on bed at [facility].  Pt was approved.    CSW is to call back at 1pm to check on bed availability.  Penni Homans, MSW, LCSW 01/31/2020 8:26 AM

## 2020-01-31 NOTE — Progress Notes (Signed)
Recreation Therapy Notes   Date: 01/31/2020  Time: 9:30 am  Location: Craft Room  Behavioral response: Appropriate   Intervention Topic: Relaxation   Discussion/Intervention:  Group content today was focused on relaxation. The group defined relaxation and identified healthy ways to relax. Individuals expressed how much time they spend relaxing. Patients expressed how much their life would be if they did not make time for themselves to relax. The group stated ways they could improve their relaxation techniques in the future.  Individuals participated in the intervention "Time to Relax" where they had a chance to experience different relaxation techniques.  Clinical Observations/Feedback:  Patient came to group defined relaxation as just chilling with no worries. He expressed that he likes to read, play games and fish to relax. Participant stated that it is important to relax when working hard and dealing with others. He explained that sometimes people stop him from relaxing. Individual was social with peers and staff while participant in the intervention during group.  Calianna Kim LRT/CTRS         Nathaniel Hansen 01/31/2020 11:54 AM

## 2020-02-01 NOTE — Progress Notes (Signed)
Pt has been calm, cooperative and social all day. He went outdoors. He is playing cards in the dayroom now. Torrie Mayers RN

## 2020-02-01 NOTE — Plan of Care (Signed)
Pt rates hopelessness and anxiety both 1/10. Pt denies depression, SI, HI and AVH. Pt was educated on care plan and verbalizes understanding. Torrie Mayers RN Problem: Education: Goal: Charity fundraiser Education information/materials will improve Outcome: Progressing Goal: Emotional status will improve Outcome: Progressing Goal: Mental status will improve Outcome: Progressing Goal: Verbalization of understanding the information provided will improve Outcome: Progressing   Problem: Safety: Goal: Periods of time without injury will increase Outcome: Progressing   Problem: Education: Goal: Ability to make informed decisions regarding treatment will improve Outcome: Progressing   Problem: Self-Concept: Goal: Ability to disclose and discuss suicidal ideas will improve Outcome: Progressing Goal: Will verbalize positive feelings about self Outcome: Progressing

## 2020-02-01 NOTE — [facility] Group Notes (Addendum)
LCSW Group Therapy Notes  Date and Time: 02/01/2020 1:00PM  Type of Therapy and Topic: Group Therapy: Healthy Vs. Unhealthy Coping Strategies  Participation Level: [facility] PARTICIPATION LEVEL: Active  Description of Group:  In this group, patients will be encouraged to explore their healthy and unhealthy coping strategics. Coping strategies are actions that we take to deal with stress, problems, or uncomfortable emotions in our daily lives. Each patient will be challenged to read some scenarios and discuss the unhealthy and healthy coping strategies within those scenarios. Also, each patient will be challenged to describe current healthy and unhealthy strategies that they use in their own lives and discuss the outcomes and barriers to those strategies. This group will be process-oriented, with patients participating in exploration of their own experiences as well as giving and receiving support and challenge from other group members.  Therapeutic Goals: 1. Patient will identify personal healthy and unhealthy coping strategies. 2. Patient will identify healthy and unhealthy coping strategies, in others, through scenarios.  3. Patient will identify expected outcomes of healthy and unhealthy coping strategies. 4. Patient will identify barriers to using healthy coping strategies.   Summary of Patient Progress:  The patient stated that current healthy strategies in his life are surrounding himself with positive people and playing basketball while current unhealthy strategies include substance use. The patient expressed a willingness to add deep breathing as a strategy to help in his recovery journey.   Therapeutic Modalities:  Cognitive Behavioral Therapy Solution Focused Therapy Motivational Interviewing   Teresita Madura, MSW, [employer] Clinical Social Worker

## 2020-02-01 NOTE — Progress Notes (Signed)
Pt has much insight and excited to be discharged but continue on with his treatment. 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 He is pleasant and cooperative. Torrie Mayers RN

## 2020-02-01 NOTE — Progress Notes (Signed)
Patient alert and oriented x 4, affect is bright on approach, he is pleasant with staff, his thoughts are organized and coherent he is interacting appropriately with peers and staff, no distress noted, he was complaint with medication regimen. and he attended evening wrap up group.Patiet was offered emotional support, 15 minutes safety checks maintained will continue to monitor

## 2020-02-01 NOTE — Tx Team (Signed)
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 acetaminophen (TYLENOL) tablet 650 mg  650 mg Oral Q6H PRN Dixon, Rashaun M, NP      . alum & mag hydroxide-simeth (MAALOX/MYLANTA) 200-200-20 MG/5ML suspension 30 mL  30 mL Oral Q4H PRN Dixon, Rashaun M, NP      . buprenorphine-naloxone (SUBOXONE) 2-0.5 mg per SL tablet 1 tablet  1 tablet Sublingual Daily Clapacs, Madie Reno, MD   1 tablet at 02/01/20 0806  . buprenorphine-naloxone (SUBOXONE) 8-2 mg per SL tablet 2 tablet  2 tablet Sublingual Daily Clapacs, Madie Reno, MD   2 tablet at 02/01/20 0807  . citalopram (CELEXA) tablet 20 mg  20 mg Oral Daily Clapacs, Madie Reno, MD   20 mg at 02/01/20 9924  . hydrOXYzine (ATARAX/VISTARIL) tablet 25 mg  25 mg Oral TID PRN Deloria Lair, NP   25 mg at 01/31/20 2123  . magnesium hydroxide (MILK OF MAGNESIA) suspension 30 mL  30 mL Oral Daily PRN Dixon, Rashaun M, NP      . nicotine (NICODERM CQ - dosed in mg/24 hours) patch 21 mg  21 mg Transdermal Daily Clapacs, Madie Reno, MD   21 mg at 02/01/20 2683  . traZODone (DESYREL) tablet 50 mg  50 mg Oral QHS PRN Deloria Lair, NP   50 mg at 01/31/20 2123   PTA Medications: Medications Prior to Admission  Medication Sig Dispense Refill Last Dose  . buprenorphine (SUBUTEX) 2 MG SUBL SL tablet Place under the tongue daily.       Patient Stressors: Financial difficulties Substance abuse  Patient Strengths: Ability for  insight Motivation for treatment/growth  Treatment Modalities: Medication Management, Group therapy, Case management,  1 to 1 session with clinician, Psychoeducation, Recreational therapy.   Physician Treatment Plan for Primary Diagnosis: Severe recurrent major depression without psychotic features ([place]) Long Term Goal(s): Improvement in symptoms so as ready for discharge Improvement in symptoms so as ready for discharge   Short Term Goals: Ability to verbalize feelings will improve Ability to disclose and discuss suicidal ideas Compliance with prescribed medications will improve Ability to identify triggers associated with substance abuse/mental health issues will improve  Medication Management: Evaluate patient's response, side effects, and tolerance of medication regimen.  Therapeutic Interventions: 1 to 1 sessions, Unit Group sessions and Medication administration.  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  Therapeutic Interventions: 1 to 1 sessions, Unit Group  sessions and Medication administration.  Evaluation of Outcomes: Progressing   RN Treatment Plan for Primary Diagnosis: Severe recurrent major depression without psychotic  features ([facility]) Long Term Goal(s): Knowledge of disease and therapeutic regimen to maintain health will improve  Short Term Goals: Ability to remain free from injury will improve, Ability to demonstrate self-control and Compliance with prescribed medications will improve  Medication Management: RN will administer medications as ordered by provider, will assess and evaluate patient's response and provide education to patient for prescribed medication. RN will report any adverse and/or side effects to prescribing provider.  Therapeutic Interventions: 1 on 1 counseling sessions, Psychoeducation, Medication administration, Evaluate responses to treatment, Monitor vital signs and CBGs as ordered, Perform/monitor CIWA, COWS, AIMS and Fall Risk screenings as ordered, Perform wound care treatments as ordered.  Evaluation of Outcomes: Progressing   LCSW Treatment Plan for Primary Diagnosis: Severe recurrent major depression without psychotic features (H[facility]) Long Term Goal(s): Safe transition to appropriate next level of care at discharge, Engage patient in therapeutic group addressing interpersonal concerns.  Short Term Goals: Engage patient in aftercare planning with referrals and resources, Increase social support, Identify triggers associated with mental health/substance abuse issues and Increase skills for wellness and recovery  Therapeutic Interventions: Assess for all discharge needs, 1 to 1 time with Social worker, Explore available resources and support systems, Assess for adequacy in community support network, Educate family and significant other(s) on suicide prevention, Complete Psychosocial Assessment, Interpersonal group therapy.  Evaluation of Outcomes: Progressing   Progress in Treatment: Attending groups: Yes. Participating in groups: Yes. Taking medication as prescribed: Yes. Toleration medication: Yes. Family/Significant other contact made: No, will contact:  pt declined  consent Patient understands diagnosis: Yes. Discussing patient identified problems/goals with staff: Yes. Medical problems stabilized or resolved: Yes. Denies suicidal/homicidal ideation: Yes. Issues/concerns per patient self-inventory: No. Other: N/A  New problem(s) identified: No, Describe:  none  New Short Term/Long Term Goal(s): Detox, elimination of AVH/symptoms of psychosis, medication management for mood stabilization; elimination of SI thoughts; development of comprehensive mental wellness/sobriety plan.   Patient Goals:  "To get myself and anxiety in line"  Discharge Plan or Barriers: SPE pamphlet, Mobile Crisis information, and AA/NA information provided to patient for additional community support and resources. [facility] and [facility] referrals sent  Reason for Continuation of Hospitalization: Depression Medication stabilization  Estimated Length of Stay: 5-7 days  Recreational Therapy: Patient Stressors: N/A Patient Goal: Patient will engage in groups without prompting or encouragement from LRT x3 group sessions within 5 recreation therapy group sessions  Attendees: Patient:  02/01/2020 2:47 PM  Physician: Dr Toni Amend MD 02/01/2020 2:47 PM  Nursing: Torrie Mayers, RN 02/01/2020 2:47 PM  RN Care Manager: 02/01/2020 2:47 PM  Social Worker: Teresita Madura LCSWA 02/01/2020 2:47 PM  Recreational Therapist:  02/01/2020 2:47 PM  Other:  02/01/2020 2:47 PM  Other:  02/01/2020 2:47 PM  Other: 02/01/2020 2:47 PM    Scribe for Treatment Team: Jimmey Ralph, LCSWA 02/01/2020 2:47 PM

## 2020-02-01 NOTE — Progress Notes (Signed)
[facility] MD Progress Note  02/01/2020 4:00 PM Nathaniel Hansen  MRN:  [id number] Subjective: No complaint.  Feeling good.  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 Anxiety   . Asthma   . Cocaine abuse ([place])   . Endocarditis   . Opioid abuse ([place])    History reviewed. No pertinent surgical history. Family History: History reviewed. No pertinent family history. Family Psychiatric  History: See previous Social History:  Social History   Substance and Sexual Activity  Alcohol Use Yes   Comment: sometimes     Social History   Substance and Sexual Activity  Drug Use Yes  . Types: Marijuana, Heroin   Comment: "sometimes" opioids, heroin     Social History   Socioeconomic History  . Marital status: Single    Spouse name: Not on file  . Number of children: Not on file  . Years of education: Not on file  . Highest education level: Not on file  Occupational History  . Not on file  Tobacco Use  . Smoking status: Current Every Day Smoker    Packs/day: 1.00    Types: Cigarettes  . Smokeless tobacco: Never Used  Substance and Sexual Activity  . Alcohol use: Yes    Comment: sometimes  . Drug use: Yes    Types: Marijuana, Heroin    Comment: "sometimes" opioids, heroin   . Sexual activity: Not on file  Other Topics Concern  . Not on file  Social History Narrative  . Not on file   Social Determinants of Health   Financial Resource Strain:   . Difficulty of Paying Living Expenses:   Food Insecurity:   . Worried  About Charity fundraiser in the Last Year:   . Arboriculturist in the Last Year:   Transportation Needs:   . Film/video editor (Medical):   Marland Kitchen Lack of Transportation (Non-Medical):   Physical Activity:   . Days of Exercise per Week:   . Minutes of Exercise per Session:   Stress:   . Feeling of Stress :   Social Connections:   . Frequency of Communication with Friends and Family:   . Frequency of Social Gatherings with Friends and Family:   . Attends Religious Services:   . Active Member of Clubs or Organizations:   . Attends Archivist Meetings:   Marland Kitchen Marital Status:    Additional Social History:                         Sleep: Good  Appetite:  Good  Current Medications: Current Facility-Administered Medications  Medication Dose Route Frequency Provider Last Rate Last Admin  . acetaminophen (TYLENOL) tablet 650 mg  650 mg Oral Q6H PRN Dixon, Rashaun M, NP      . alum & mag hydroxide-simeth (MAALOX/MYLANTA) 200-200-20 MG/5ML suspension 30 mL  30 mL Oral Q4H PRN Dixon, Rashaun M, NP      . buprenorphine-naloxone (SUBOXONE) 2-0.5 mg per SL tablet 1 tablet  1 tablet Sublingual Daily Avaree Gilberti, Madie Reno, MD  1 tablet at 02/01/20 0806  . buprenorphine-naloxone (SUBOXONE) 8-2 mg per SL tablet 2 tablet  2 tablet Sublingual Daily Billey Wojciak, Jackquline Denmark, MD   2 tablet at 02/01/20 0807  . citalopram (CELEXA) tablet 20 mg  20 mg Oral Daily Tomeca Helm, Jackquline Denmark, MD   20 mg at 02/01/20 6160  . hydrOXYzine (ATARAX/VISTARIL) tablet 25 mg  25 mg Oral TID PRN Jearld Lesch, NP   25 mg at 01/31/20 2123  . magnesium hydroxide (MILK OF MAGNESIA) suspension 30 mL  30 mL Oral Daily PRN Dixon, Rashaun M, NP      . nicotine (NICODERM CQ - dosed in mg/24 hours) patch 21 mg  21 mg Transdermal Daily Derrious Bologna, Jackquline Denmark, MD   21 mg at 02/01/20 7371  . traZODone (DESYREL) tablet 50 mg  50 mg Oral QHS PRN Jearld Lesch, NP   50 mg at 01/31/20 2123    Lab Results: No results found for this or any  previous visit (from the past 48 hour(s)).  Blood Alcohol level:  Lab Results  Component Value Date   ETH <10 01/25/2020   ETH <10 01/25/2020    Metabolic Disorder Labs: No results found for: HGBA1C, MPG No results found for: PROLACTIN No results found for: CHOL, TRIG, HDL, CHOLHDL, VLDL, LDLCALC  Physical Findings: AIMS:  , ,  ,  ,    CIWA:    COWS:     Musculoskeletal: Strength & Muscle Tone: within normal limits Gait & Station: normal Patient leans: N/A  Psychiatric Specialty Exam: Physical Exam  Nursing note and vitals reviewed. Constitutional: He appears well-developed and well-nourished.  HENT:  Head: Normocephalic and atraumatic.  Eyes: Pupils are equal, round, and reactive to light. Conjunctivae are normal.  Cardiovascular: Regular rhythm and normal heart sounds.  Respiratory: Effort normal.  GI: Soft.  Musculoskeletal:        General: Normal range of motion.     Cervical back: Normal range of motion.  Neurological: He is alert.  Skin: Skin is warm and dry.  Psychiatric: He has a normal mood and affect. His speech is normal and behavior is normal. Judgment and thought content normal. Cognition and memory are normal.    Review of Systems  Constitutional: Negative.   HENT: Negative.   Eyes: Negative.   Respiratory: Negative.   Cardiovascular: Negative.   Gastrointestinal: Negative.   Musculoskeletal: Negative.   Skin: Negative.   Neurological: Negative.   Psychiatric/Behavioral: Negative.     Blood pressure (!) 111/58, pulse (!) 58, temperature 97.8 F (36.6 C), temperature source Oral, resp. rate 18, height 6' (1.829 m), weight 75.8 kg, SpO2 100 %.Body mass index is 22.66 kg/m.  General Appearance: Casual  Eye Contact:  Good  Speech:  Clear and Coherent  Volume:  Normal  Mood:  Euthymic  Affect:  Congruent  Thought Process:  Goal Directed  Orientation:  Full (Time, Place, and Person)  Thought Content:  Logical  Suicidal Thoughts:  No  Homicidal  Thoughts:  No  Memory:  Immediate;   Fair Recent;   Fair Remote;   Fair  Judgement:  Fair  Insight:  Fair  Psychomotor Activity:  Normal  Concentration:  Concentration: Fair  Recall:  [employer] of Knowledge:  Fair  Language:  Fair  Akathisia:  No  Handed:  Right  AIMS (if indicated):     Assets:  Desire for Improvement  ADL's:  Intact  Cognition:  WNL  Sleep:  Number of Hours: 6.15  Treatment Plan Summary: Daily contact with patient to assess and evaluate symptoms and progress in treatment, Medication management and Plan No change to treatment plan.  Lined up for [facility] at the beginning of the week  Mordecai Rasmussen, MD 02/01/2020, 4:00 PM

## 2020-02-02 MED ORDER — WHITE PETROLATUM EX OINT
TOPICAL_OINTMENT | CUTANEOUS | Status: AC
  Filled 2020-02-02: qty 5

## 2020-02-02 MED ORDER — TRAZODONE HCL 50 MG PO TABS
50.0000 mg | ORAL_TABLET | Freq: Every day | ORAL | Status: AC
  Administered 2020-02-02: 50 mg via ORAL

## 2020-02-02 MED ORDER — BUPRENORPHINE HCL-NALOXONE HCL 8-2 MG SL SUBL: 2.0000 | SUBLINGUAL_TABLET | Freq: Every day | SUBLINGUAL | Status: DC

## 2020-02-02 MED ORDER — BUPRENORPHINE HCL-NALOXONE HCL 2-0.5 MG SL SUBL: 1.0000 | SUBLINGUAL_TABLET | Freq: Every day | SUBLINGUAL | Status: DC

## 2020-02-02 MED ORDER — HYDROXYZINE HCL 25 MG PO TABS: 25.0000 mg | ORAL_TABLET | Freq: Three times a day (TID) | ORAL | 0 refills | Status: DC | PRN

## 2020-02-02 MED ORDER — CITALOPRAM HYDROBROMIDE 20 MG PO TABS: 20.0000 mg | ORAL_TABLET | Freq: Every day | ORAL | 0 refills | Status: DC

## 2020-02-02 MED ORDER — TRAZODONE HCL 50 MG PO TABS: 50.0000 mg | ORAL_TABLET | Freq: Every evening | ORAL | 0 refills | Status: DC | PRN

## 2020-02-02 NOTE — [facility] Group Notes (Signed)
LCSW Group Therapy Note  02/02/2020 1:00pm  Type of Therapy and Topic:  Group Therapy:  Cognitive Distortions  Participation Level:  Active   Description of Group:    Patients in this group will be introduced to the topic of cognitive distortions.  Patients will identify and describe cognitive distortions, describe the feelings these distortions create for them.  Patients will identify one or more situations in their personal life where they have cognitively distorted thinking and will verbalize challenging this cognitive distortion through positive thinking skills.  Patients will practice the skill of using positive affirmations to challenge cognitive distortions using affirmation cards.    Therapeutic Goals:  1. Patient will identify two or more cognitive distortions they have used 2. Patient will identify one or more emotions that stem from use of a cognitive distortion 3. Patient will demonstrate use of a positive affirmation to counter a cognitive distortion through discussion and/or role play. 4. Patient will describe one way cognitive distortions can be detrimental to wellness   Summary of Patient Progress:  Patient was bale to identify the current cognitive distortions he currently uses as all or nothing thinking and overspecialization. The pt committed to challenging cognitive distortions by practicing positive thinking.    Therapeutic Modalities:   Cognitive Behavioral Therapy Motivational Interviewing   Teresita Madura, MSW, [facility] Clinical Social Worker  02/02/2020 2:48 PM

## 2020-02-02 NOTE — Progress Notes (Signed)
D: Patient has been pleasant, calm and cooperative. Mood is euphoric. Affect is bright and animated. Denies SI, HI and AVH.  A: continue to monitor for safety R: Safety maintained

## 2020-02-02 NOTE — Progress Notes (Signed)
[facility] MD Progress Note  02/02/2020 1:42 PM Nathaniel Hansen  MRN:  [id number] Subjective: Patient seen chart reviewed.  34 year old man with a history of substance abuse and depression.  He continues to report feeling good.  Denies suicidal thoughts.  Denies psychotic symptoms.  Expresses that he is optimistic and looking forward to going to substance abuse treatment.  Patient has had no behavior problems on the unit no sign of any acute physical distress. Principal Problem: Severe recurrent major depression without psychotic features ([facility]CC) Diagnosis: Principal Problem:   Severe recurrent major depression without psychotic features (HCC) Active Problems:   Opioid dependence (HCC)   Polysubstance abuse ([facility])   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)  Total Time spent with patient: 30 minutes  Past Psychiatric History: Past history of chronic substance use problems  Past Medical History:  Past Medical History:  Diagnosis Date  . Anxiety   . Asthma   . Cocaine abuse ([facility])   . Endocarditis   . Opioid abuse ([facility])    History reviewed. No pertinent surgical history. Family History: History reviewed. No pertinent family history. Family Psychiatric  History: See previous Social History:  Social History   Substance and Sexual Activity  Alcohol Use Yes   Comment: sometimes     Social History   Substance and Sexual Activity  Drug Use Yes  . Types: Marijuana, Heroin   Comment: "sometimes" opioids, heroin     Social History   Socioeconomic History  . Marital status: Single    Spouse name: Not on file  . Number of children: Not on file  . Years of education: Not on file  . Highest education level: Not on file  Occupational History  . Not on file  Tobacco Use  . Smoking status: Current Every Day Smoker    Packs/day: 1.00    Types: Cigarettes  . Smokeless tobacco: Never Used  Substance and Sexual Activity  . Alcohol use: Yes    Comment: sometimes  . Drug  use: Yes    Types: Marijuana, Heroin    Comment: "sometimes" opioids, heroin   . Sexual activity: Not on file  Other Topics Concern  . Not on file  Social History Narrative  . Not on file   Social Determinants of Health   Financial Resource Strain:   . Difficulty of Paying Living Expenses:   Food Insecurity:   . Worried About Programme researcher, broadcasting/film/video in the Last Year:   . Barista in the Last Year:   Transportation Needs:   . Freight forwarder (Medical):   Marland Kitchen Lack of Transportation (Non-Medical):   Physical Activity:   . Days of Exercise per Week:   . Minutes of Exercise per Session:   Stress:   . Feeling of Stress :   Social Connections:   . Frequency of Communication with Friends and Family:   . Frequency of Social Gatherings with Friends and Family:   . Attends Religious Services:   . Active Member of Clubs or Organizations:   . Attends Banker Meetings:   Marland Kitchen Marital Status:    Additional Social History:                         Sleep: Good  Appetite:  Good  Current Medications: Current Facility-Administered Medications  Medication Dose Route Frequency Provider Last Rate Last Admin  . white petrolatum (VASELINE) gel           .  acetaminophen (TYLENOL) tablet 650 mg  650 mg Oral Q6H PRN Dixon, Rashaun M, NP      . alum & mag hydroxide-simeth (MAALOX/MYLANTA) 200-200-20 MG/5ML suspension 30 mL  30 mL Oral Q4H PRN Dixon, Rashaun M, NP      . buprenorphine-naloxone (SUBOXONE) 2-0.5 mg per SL tablet 1 tablet  1 tablet Sublingual Daily Hansini Clodfelter, Madie Reno, MD   1 tablet at 02/02/20 0830  . buprenorphine-naloxone (SUBOXONE) 8-2 mg per SL tablet 2 tablet  2 tablet Sublingual Daily Abhay Godbolt, Madie Reno, MD   2 tablet at 02/02/20 0831  . citalopram (CELEXA) tablet 20 mg  20 mg Oral Daily Kashaun Bebo T, MD   20 mg at 02/02/20 0831  . hydrOXYzine (ATARAX/VISTARIL) tablet 25 mg  25 mg Oral TID PRN Deloria Lair, NP   25 mg at 02/01/20 2114  . magnesium  hydroxide (MILK OF MAGNESIA) suspension 30 mL  30 mL Oral Daily PRN Dixon, Rashaun M, NP      . nicotine (NICODERM CQ - dosed in mg/24 hours) patch 21 mg  21 mg Transdermal Daily Shawan Tosh, Madie Reno, MD   21 mg at 02/02/20 0831  . traZODone (DESYREL) tablet 50 mg  50 mg Oral QHS PRN Deloria Lair, NP   50 mg at 02/01/20 2114    Lab Results: No results found for this or any previous visit (from the past 48 hour(s)).  Blood Alcohol level:  Lab Results  Component Value Date   ETH <10 01/25/2020   ETH <10 53/97/6734    Metabolic Disorder Labs: No results found for: HGBA1C, MPG No results found for: PROLACTIN No results found for: CHOL, TRIG, HDL, CHOLHDL, VLDL, LDLCALC  Physical Findings: AIMS:  , ,  ,  ,    CIWA:    COWS:     Musculoskeletal: Strength & Muscle Tone: within normal limits Gait & Station: normal Patient leans: N/A  Psychiatric Specialty Exam: Physical Exam  Nursing note and vitals reviewed. Constitutional: He appears well-developed and well-nourished.  HENT:  Head: Normocephalic and atraumatic.  Eyes: Pupils are equal, round, and reactive to light. Conjunctivae are normal.  Cardiovascular: Regular rhythm and normal heart sounds.  Respiratory: Effort normal. No respiratory distress.  GI: Soft.  Musculoskeletal:        General: Normal range of motion.     Cervical back: Normal range of motion.  Neurological: He is alert.  Skin: Skin is warm and dry.  Psychiatric: He has a normal mood and affect. His behavior is normal. Judgment and thought content normal.    Review of Systems  Constitutional: Negative.   HENT: Negative.   Eyes: Negative.   Respiratory: Negative.   Cardiovascular: Negative.   Gastrointestinal: Negative.   Musculoskeletal: Negative.   Skin: Negative.   Neurological: Negative.   Psychiatric/Behavioral: Negative.     Blood pressure (!) 111/58, pulse (!) 58, temperature 97.8 F (36.6 C), temperature source Oral, resp. rate 18, height 6'  (1.829 m), weight 75.8 kg, SpO2 100 %.Body mass index is 22.66 kg/m.  General Appearance: Casual  Eye Contact:  Good  Speech:  Clear and Coherent  Volume:  Normal  Mood:  Euthymic  Affect:  Congruent  Thought Process:  Goal Directed  Orientation:  Full (Time, Place, and Person)  Thought Content:  Logical  Suicidal Thoughts:  No  Homicidal Thoughts:  No  Memory:  Immediate;   Fair Recent;   Fair Remote;   Fair  Judgement:  Fair  Insight:  Fair  Psychomotor Activity:  Normal  Concentration:  Concentration: Fair  Recall:  [employer] of Knowledge:  Fair  Language:  Fair  Akathisia:  No  Handed:  Right  AIMS (if indicated):     Assets:  Desire for Improvement  ADL's:  Intact  Cognition:  WNL  Sleep:  Number of Hours: 5     Treatment Plan Summary: Daily contact with patient to assess and evaluate symptoms and progress in treatment, Medication management and Plan [facility] admission is planned for tomorrow.  Psychoeducation and review of treatment plan.  I will go ahead and start make preparations for likely discharge  Mordecai Rasmussen, MD 02/02/2020, 1:42 PM

## 2020-02-02 NOTE — [facility] Suicide Risk Assessment (Signed)
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   HENT: Negative.   Eyes: Negative.   Respiratory: Negative.   Cardiovascular: Negative.   Gastrointestinal: Negative.   Musculoskeletal: Negative.   Skin: Negative.   Neurological: Negative.   Psychiatric/Behavioral: Negative.     Blood pressure (!) 111/58, pulse (!) 58, temperature 97.8 F (36.6 C), temperature source Oral, resp. rate 18, height 6' (1.829 m), weight 75.8 kg, SpO2 100 %.Body mass index is 22.66 kg/m.  General Appearance: Casual  Eye Contact::  Good  Speech:  Clear and Coherent409  Volume:  Normal  Mood:  Euthymic  Affect:  Congruent  Thought Process:  Goal Directed  Orientation:  Full (Time, Place, and Person)  Thought Content:  Logical  Suicidal Thoughts:  No  Homicidal Thoughts:  No  Memory:  Immediate;   Fair Recent;   Fair Remote;   Fair  Judgement:  Fair  Insight:  Fair  Psychomotor Activity:  Normal  Concentration:  Fair  Recall:  [employer] of Knowledge:Fair  Language: Fair  Akathisia:  No  Handed:  Right  AIMS (if indicated):     Assets:  Desire for Improvement Physical Health  Sleep:  Number of Hours: 5  Cognition: WNL  ADL's:  Intact   Mental Status Per Nursing Assessment::   On Admission:  Suicidal ideation indicated by patient  Demographic Factors:  Male  Loss Factors: NA  Historical Factors: NA  Risk Reduction Factors:   Religious  beliefs about death, Positive social support and Positive therapeutic relationship  Continued Clinical Symptoms:  Alcohol/Substance Abuse/Dependencies  Cognitive Features That Contribute To Risk:  None    Suicide Risk:  Minimal: No identifiable suicidal ideation.  Patients presenting with no risk factors but with morbid ruminations; may be classified as minimal risk based on the severity of the depressive symptoms  Follow-up Information    [facility] Follow [facility].   Why: Admission on 02/03/2020 with their Opiate program.  Thanks! Contact information: [address] [place] [address] [phone]           Plan Of Care/Follow-up recommendations:  Activity:  Activity as tolerated Diet:  Regular diet Other:  Follow-up outpatient treatment at [facility]  Mordecai Rasmussen, MD 02/02/2020, 1:45 PM

## 2020-02-02 NOTE — Plan of Care (Signed)
  Problem: Safety: Goal: Periods of time without injury will increase Outcome: Progressing  D: Patient has been pleasant, calm and cooperative. Mood is euphoric. Affect is bright and animated. Denies SI, HI and AVH.  A: continue to monitor for safety R: Safety maintained

## 2020-02-02 NOTE — Plan of Care (Signed)
  Problem: Education: Goal: Knowledge of [facility] Education information/materials will improve Outcome: Progressing Goal: Emotional status will improve Outcome: Progressing Goal: Mental status will improve Outcome: Progressing Goal: Verbalization of understanding the information provided will improve Outcome: Progressing   Problem: Safety: Goal: Periods of time without injury will increase Outcome: Progressing   Problem: Education: Goal: Ability to make informed decisions regarding treatment will improve Outcome: Progressing   Problem: Self-Concept: Goal: Ability to disclose and discuss suicidal ideas will improve Outcome: Progressing

## 2020-02-03 DIAGNOSIS — F332 Major depressive disorder, recurrent severe without psychotic features: Principal | ICD-10-CM

## 2020-02-03 NOTE — Progress Notes (Signed)
  [facility] Adult Case Management Discharge Plan :  Will you be returning to the same living situation after discharge:  No. At discharge, do you have transportation home?: Yes,  safe transport Do you have the ability to pay for your medications: Yes,  mental health  Release of information consent forms completed and in the chart;    Patient to Follow up at: Follow-up Information    [facility] Follow up.   Why: Admission on 02/03/2020 with their Opiate program.  Thanks! Contact information: [address] [place] [address] [phone]           Next level of care provider has access to [facility] Link:no  Safety Planning and Suicide Prevention discussed: Yes,  SPE completed with pt as pt declined collateral contact  Have you used any form of tobacco in the last 30 days? (Cigarettes, Smokeless Tobacco, Cigars, and/or Pipes): Yes  Has patient been referred to the Quitline?: Patient refused referral  Patient has been referred for addiction treatment: Yes  Mechele Dawley, LCSW 02/03/2020, 9:16 AM

## 2020-02-03 NOTE — Progress Notes (Signed)
   02/03/20 1115  Clinical Encounter Type  Visited With Patient  Visit Type Follow-up;Spiritual support;Social support;Behavioral Health  Referral From Chaplain  Consult/Referral To Chaplain  Chaplain visited with patient after finding out he is being discharged today. Patient explained that he is being discharged to a 90 day facility. He explained what he had done in the past after leaving a program like [facility]. He explained that he go back on drugs. Patient explained that he has a cousin who supports him and who has his back. Patient mentioned this cousin struggled with alcohol at some time in his life, therefore he understands the struggle patient is dealing with. Patient said he loves his mother, but she doesn't understand. Patient is thinking about moving [place] when he leaves the 90 day program. His son is up [place]. Chaplain offered pastoral presence and empathy.

## 2020-02-03 NOTE — Progress Notes (Signed)
D: Patient has been calm, pleasant and cooperative. Denies SI, HI and AVH. Requested extra Trazodone for sleep because 50mg  was not helping his insomnia. After getting an additional 50 mg patient rested in bed with no further complaints. A: continue to monitor for safety R: safety maintained

## 2020-02-03 NOTE — Discharge Summary (Signed)
Physician Discharge Summary Note  Patient:  Nathaniel Hansen is an 34 y.o., male MRN:  [id number] DOB:  Mar 07, 1986 Patient phone:  [phone] (home)  Patient address:   [address] [place] [address],  Total Time spent with patient: 30 minutes  Date of Admission:  01/27/2020 Date of Discharge: 02/03/20  Reason for Admission:  34 year old man presented to the hospital in [place] with complaints of anxiety and depression with suicidal ideation.  Patient has been using intravenous cocaine regularly.  Also has a history of opiate abuse.  Mood has been increasingly bad for weeks.  He has been having some suicidal thoughts without specific plan.  Principal Problem: Severe recurrent major depression without psychotic features [facility]) Discharge Diagnoses: Principal Problem:   Severe recurrent major depression without psychotic features ([place]) Active Problems:   Opioid dependence ([place])   Polysubstance abuse ([place])   Cocaine abuse with cocaine-induced anxiety disorder [facility])   Past Psychiatric History: Patient has a past history of substance abuse.  Currently involved in outpatient opiate dependence treatment with Suboxone.  No known previous hospitalizations.  Unclear if he has ever been on any other psychiatric medicine  Past Medical History:  Past Medical History:  Diagnosis Date  . Anxiety   . Asthma   . Cocaine abuse ([place])   . Endocarditis   . Opioid abuse ([place])    History reviewed. No pertinent surgical history. Family History: History reviewed. No pertinent family history. Family Psychiatric  History: None reported Social History:  Social History   Substance and Sexual Activity  Alcohol Use Yes   Comment: sometimes     Social History   Substance and Sexual Activity  Drug Use Yes  . Types: Marijuana, Heroin   Comment: "sometimes" opioids, heroin     Social History   Socioeconomic History  . Marital status: Single    Spouse name: Not on file  .  Number of children: Not on file  . Years of education: Not on file  . Highest education level: Not on file  Occupational History  . Not on file  Tobacco Use  . Smoking status: Current Every Day Smoker    Packs/day: 1.00    Types: Cigarettes  . Smokeless tobacco: Never Used  Substance and Sexual Activity  . Alcohol use: Yes    Comment: sometimes  . Drug use: Yes    Types: Marijuana, Heroin    Comment: "sometimes" opioids, heroin   . Sexual activity: Not on file  Other Topics Concern  . Not on file  Social History Narrative  . Not on file   Social Determinants of Health   Financial Resource Strain:   . Difficulty of Paying Living Expenses:   Food Insecurity:   . Worried About Charity fundraiser in the Last Year:   . Arboriculturist in the Last Year:   Transportation Needs:   . Film/video editor (Medical):   Marland Kitchen Lack of Transportation (Non-Medical):   Physical Activity:   . Days of Exercise per Week:   . Minutes of Exercise per Session:   Stress:   . Feeling of Stress :   Social Connections:   . Frequency of Communication with Friends and Family:   . Frequency of Social Gatherings with Friends and Family:   . Attends Religious Services:   . Active Member of Clubs or Organizations:   . Attends Archivist Meetings:   Marland Kitchen Marital Status:     Hospital Course:  Patient remained on the [facility] unit for 7 days. The patient stabilized on medication and therapy. Patient was discharged on Suboxone 8-2 mg and 2-0.5 mg sublingual, Celexa 20 mg daily, Vistaril 25 mg 3 times daily as needed, trazodone 50 mg p.o. nightly as needed. Patient has shown improvement with improved mood, affect, sleep, appetite, and interaction. Patient has attended group and participated. Patient has been seen in the day room interacting with peers and staff appropriately. Patient denies any SI/HI/AVH and contracts for safety. Patient agrees to follow up at [facility]. Patient is provided with prescriptions  for their medications upon discharge.   Physical Findings: AIMS:  , ,  ,  ,    CIWA:    COWS:     Musculoskeletal: Strength & Muscle Tone: within normal limits Gait & Station: normal Patient leans: N/A  Psychiatric Specialty Exam: Physical Exam  Nursing note and vitals reviewed. Constitutional: He is oriented to person, place, and time. He appears well-developed and well-nourished.  Cardiovascular: Normal rate.  Respiratory: Effort normal.  Musculoskeletal:        General: Normal range of motion.  Neurological: He is alert and oriented to person, place, and time.  Skin: Skin is warm.    Review of Systems  Constitutional: Negative.   HENT: Negative.   Eyes: Negative.   Respiratory: Negative.   Cardiovascular: Negative.   Gastrointestinal: Negative.   Genitourinary: Negative.   Musculoskeletal: Negative.   Skin: Negative.   Neurological: Negative.   Psychiatric/Behavioral: Negative.     Blood pressure 123/70, pulse 74, temperature 98.2 F (36.8 C), temperature source Oral, resp. rate 18, height 6' (1.829 m), weight 75.8 kg, SpO2 100 %.Body mass index is 22.66 kg/m.   General Appearance: Casual  Eye Contact::  Good  Speech:  Clear and Coherent409  Volume:  Normal  Mood:  Euthymic  Affect:  Congruent  Thought Process:  Goal Directed  Orientation:  Full (Time, Place, and Person)  Thought Content:  Logical  Suicidal Thoughts:  No  Homicidal Thoughts:  No  Memory:  Immediate;   Fair Recent;   Fair Remote;   Fair  Judgement:  Fair  Insight:  Fair  Psychomotor Activity:  Normal  Concentration:  Fair  Recall:  [employer] of Knowledge:Fair  Language: Fair  Akathisia:  No  Handed:  Right  AIMS (if indicated):     Assets:  Desire for Improvement Physical Health  Sleep:  Number of Hours: 5  Cognition: WNL  ADL's:  Intact   Have you used any form of tobacco in the last 30 days? (Cigarettes, Smokeless Tobacco, Cigars, and/or Pipes): Yes  Has this patient used  any form of tobacco in the last 30 days? (Cigarettes, Smokeless Tobacco, Cigars, and/or Pipes) Yes, Yes, A prescription for an FDA-approved tobacco cessation medication was offered at discharge and the patient refused  Blood Alcohol level:  Lab Results  Component Value Date   ETH <10 01/25/2020   ETH <10 01/25/2020    Metabolic Disorder Labs:  No results found for: HGBA1C, MPG No results found for: PROLACTIN No results found for: CHOL, TRIG, HDL, CHOLHDL, VLDL, LDLCALC  See Psychiatric Specialty Exam and Suicide Risk Assessment completed by Attending Physician prior to discharge.  Discharge destination:  [facility]  Is patient on multiple antipsychotic therapies at discharge:  No   Has Patient had three or more failed trials of antipsychotic monotherapy by history:  No  Recommended Plan for Multiple Antipsychotic Therapies: NA   Allergies as of  02/03/2020   No Known Allergies     Medication List    STOP taking these medications   buprenorphine 2 MG Subl SL tablet Commonly known as: SUBUTEX     TAKE these medications     Indication  buprenorphine-naloxone 2-0.5 mg Subl SL tablet Commonly known as: SUBOXONE Place 1 tablet under the tongue daily.  Indication: Opioid Dependence   buprenorphine-naloxone 8-2 mg Subl SL tablet Commonly known as: SUBOXONE Place 2 tablets under the tongue daily.  Indication: Opioid Dependence   citalopram 20 MG tablet Commonly known as: CELEXA Take 1 tablet (20 mg total) by mouth daily.  Indication: Depression   hydrOXYzine 25 MG tablet Commonly known as: ATARAX/VISTARIL Take 1 tablet (25 mg total) by mouth 3 (three) times daily as needed for anxiety.  Indication: Feeling Anxious   traZODone 50 MG tablet Commonly known as: DESYREL Take 1 tablet (50 mg total) by mouth at bedtime as needed for sleep.  Indication: Trouble Sleeping      Follow-up Information    [facility] Follow up.   Why:  Admission on 02/03/2020 with their Opiate program.  Thanks! Contact information: [address] [place] [address] [phone]           Follow-up recommendations:  Continue activity as tolerated. Continue diet as recommended by your PCP. Ensure to keep all appointments with outpatient providers.  Comments:  Patient is instructed prior to discharge to: Take all medications as prescribed by his/her mental healthcare provider. Report any adverse effects and or reactions from the medicines to his/her outpatient provider promptly. Patient has been instructed & cautioned: To not engage in alcohol and or illegal drug use while on prescription medicines. In the event of worsening symptoms, patient is instructed to call the crisis hotline, 911 and or go to the nearest ED for appropriate evaluation and treatment of symptoms. To follow-up with his/her primary care provider for your other medical issues, concerns and or health care needs.    Signed: Gerlene Burdock Xandrea Clarey, FNP 02/03/2020, 9:09 AM

## 2020-02-03 NOTE — Progress Notes (Signed)
Recreation Therapy Notes  INPATIENT RECREATION TR PLAN  Patient Details Name: Nathaniel Hansen MRN: [id number] DOB: 14-Jun-1986 Today's Date: 02/03/2020  Rec Therapy Plan Is patient appropriate for Therapeutic Recreation?: Yes Treatment times per week: at least 3 Estimated Length of Stay: 5-7 days TR Treatment/Interventions: Group participation (Comment)  Discharge Criteria Pt will be discharged from therapy if:: Discharged Treatment plan/goals/alternatives discussed and agreed upon by:: Patient/family  Discharge Summary Short term goals set: Patient will engage in groups without prompting or encouragement from LRT x3 group sessions within 5 recreation therapy group sessions Short term goals met: Complete Progress toward goals comments: Groups attended Which groups?: Stress management, Goal setting, AAA/T, Other (Comment)(Relaxation) Reason goals not met: N/A Therapeutic equipment acquired: N/A Reason patient discharged from therapy: Discharge from hospital Pt/family agrees with progress & goals achieved: Yes Date patient discharged from therapy: 02/03/20   Sokhna Christoph 02/03/2020, 1:51 PM

## 2020-02-03 NOTE — Progress Notes (Signed)
Patient denies SI/HI, denies A/V hallucinations. Patient verbalizes understanding of discharge instructions, follow up care and prescriptions. Patient given all belongings from [facility] locker. Patient escorted out by staff, transported by safe transport to [facility]. 

## 2020-02-03 NOTE — Progress Notes (Signed)
Recreation Therapy Notes    Date: 02/03/2020  Time: 9:30 am  Location: Craft Room  Behavioral response: Appropriate   Intervention Topic: Stress Management   Discussion/Intervention:  Group content on today was focused on stress. The group defined stress and way to cope with stress. Participants expressed how they know when they are stresses out. Individuals described the different ways they have to cope with stress. The group stated reasons why it is important to cope with stress. Patient explained what good stress is and some examples. The group participated in the intervention "Stress Management". Individuals were able to brainstorm new ways to manage their stress.  Clinical Observations/Feedback:  Patient came to group defined stress as not feeling good. He explained that stress cause him to feel down, helpless, angry, and confused. Participant stated that stress can cause weight gain due to overeating and not doing any physical movement. Patient express that using drugs can make stress worse. Individual was social with peers and staff while participant in the intervention during group.  Kaliah Haddaway LRT/CTRS         Othel Hoogendoorn 02/03/2020 10:59 AM

## 2020-02-21 MED ORDER — CITALOPRAM HYDROBROMIDE 10 MG PO TABS
20.00 | ORAL_TABLET | ORAL | Status: DC
Start: 2020-02-21 — End: 2020-02-21

## 2020-09-29 ENCOUNTER — Emergency Department (HOSPITAL_COMMUNITY): Payer: Self-pay

## 2020-09-29 ENCOUNTER — Other Ambulatory Visit: Payer: Self-pay

## 2020-09-29 ENCOUNTER — Emergency Department (HOSPITAL_COMMUNITY)
Admission: EM | Admit: 2020-09-29 | Discharge: 2020-09-29 | Disposition: A | Discharge status: Home / Self Care | Payer: Self-pay | Attending: Emergency Medicine | Admitting: Emergency Medicine

## 2020-09-29 DIAGNOSIS — F1490 Cocaine use, unspecified, uncomplicated: Secondary | ICD-10-CM | POA: Insufficient documentation

## 2020-09-29 DIAGNOSIS — F17210 Nicotine dependence, cigarettes, uncomplicated: Secondary | ICD-10-CM | POA: Insufficient documentation

## 2020-09-29 DIAGNOSIS — J45909 Unspecified asthma, uncomplicated: Secondary | ICD-10-CM | POA: Insufficient documentation

## 2020-09-29 DIAGNOSIS — F419 Anxiety disorder, unspecified: Secondary | ICD-10-CM | POA: Insufficient documentation

## 2020-09-29 MED ORDER — HYDROXYZINE HCL 25 MG PO TABS
50.0000 mg | ORAL_TABLET | Freq: Once | ORAL | Status: AC
  Administered 2020-09-29: 50 mg via ORAL
  Filled 2020-09-29: qty 2

## 2020-09-29 NOTE — ED Triage Notes (Signed)
Pt states he did cocaine today and smokes weed, 1 ppd smoker

## 2020-09-29 NOTE — Discharge Instructions (Addendum)
Please find a healthier outlet for managing your anxiety and stress symptoms.  I do not want to see you get very sick from your IV drug use.  Please review the resource guide for outpatient counseling and substance use.    I am glad that you are feeling improved after receiving antianxiety medication here in the ED.  Please do not drive today given that this can make you very drowsy.  Please return to the ED or seek immediate medical attention should you experience any new or worsening symptoms.

## 2020-09-29 NOTE — ED Provider Notes (Addendum)
[facility] EMERGENCY DEPARTMENT Provider Note   CSN: [id number] Arrival date & time: 09/29/20  1529     History Chief Complaint  Patient presents with  . Shortness of Breath    Nathaniel Hansen is a 34 y.o. male with PMH significant for bipolar disorder, homelessness, medical noncompliance, polysubstance abuse including IVDA, infective endocarditis, moderate-severe tricuspid regurgitation, and septic shock from pneumonia who presents to the ED via EMS for shortness of breath.  On my examination, patient reports that he was doing IV cocaine earlier today.  He states that it has caused him to feel very anxious with palpitations and sensation that he cannot catch his breath.  He states that he had been going to [facility] when he lived in [place] and is now going somewhere locally for his Suboxone treatments.  Patient reports that he had been doing well up until a few days ago and then relapsed with IV cocaine use.  He states that he is currently living with his mother here in [place], [place].  He states that he had been stressed recently which prompted him to relapse.  He states that otherwise he has been feeling fine, specifically denying any fevers or chills, chest pain, unilateral extremity swelling or edema, abdominal pain, nausea or vomiting, recent illness or infection, pleuritic symptoms, cough, or other symptoms.  In addition to the IV cocaine, patient also endorses an active 20-pack-year smoking history.  By the time of my assessment, patient states that he is already feeling improved after EMS informed him that this is like related to his drug use and anxiety.  He would still appreciate something for his ongoing anxiety and feelings of SOB, however.    I reviewed patient's medical record and during his last psychiatric evaluation in [place], [place] he was ultimately discharged home with melatonin nightly, olanzapine nightly, daily Zoloft, and hydroxyzine twice daily as needed.  HPI      Past Medical History:  Diagnosis Date  . Anxiety   . Asthma   . Cocaine abuse (HCC)   . Endocarditis   . Opioid abuse [facility])     Patient Active Problem List   Diagnosis Date Noted  . 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 Severe recurrent major depression without psychotic features (HCC) 01/27/2020  . Polysubstance abuse (HCC) 11/08/2019  . Streptococcal sepsis, unspecified (HCC) 10/27/2019  . Opioid dependence (HCC) 10/23/2019  . Encounter for monitoring Suboxone maintenance therapy 10/23/2019  . Leukocytosis 10/23/2019  . Fever, unspecified 10/23/2019  . 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 Streptococcal Mitis/Oralis Bacteremia 10/08/2019  . Septic shock (HCC) 10/07/2019  . CAP (community acquired pneumonia) 10/07/2019  . Anemia 10/07/2019  . Lactic acidosis 10/07/2019  . Chronic back pain 10/07/2019    No past surgical history on file.     No family history on file.  Social History   Tobacco Use  . Smoking status: Current Every Day Smoker    Packs/day: 1.00    Types: Cigarettes  . Smokeless tobacco: Never Used  Vaping Use  . Vaping Use: Never used  Substance Use Topics  . Alcohol use: Yes    Comment: sometimes  . Drug use: Yes    Types: Marijuana, Heroin    Comment: "sometimes" opioids, heroin     Home Medications Prior to Admission medications   Medication Sig  Start Date End Date Taking? Authorizing Provider  buprenorphine (SUBUTEX) 2 MG SUBL SL tablet Place 2 mg under the tongue See admin instructions.    Yes [provider]  buprenorphine (SUBUTEX) 8 MG SUBL SL tablet Place 8 mg under the tongue daily.   Yes [provider]  citalopram (CELEXA) 20 MG tablet Take 1 tablet (20 mg total) by mouth daily. 02/03/20   Yes Clapacs, Jackquline Denmark, MD  hydrOXYzine (ATARAX/VISTARIL) 25 MG tablet Take 1 tablet (25 mg total) by mouth 3 (three) times daily as needed for anxiety. 02/02/20  Yes Clapacs, Jackquline Denmark, MD  melatonin 3 MG TABS tablet Take by mouth. 08/11/20  Yes [provider]  OLANZapine (ZYPREXA) 5 MG tablet Take by mouth. 08/11/20  Yes [provider]  buprenorphine-naloxone (SUBOXONE) 2-0.5 mg SUBL SL tablet Place 1 tablet under the tongue daily. Patient not taking: Reported on 09/29/2020 02/03/20   Clapacs, Jackquline Denmark, MD  buprenorphine-naloxone (SUBOXONE) 8-2 mg SUBL SL tablet Place 2 tablets under the tongue daily. Patient not taking: Reported on 09/29/2020 02/03/20   Clapacs, Jackquline Denmark, MD  traZODone (DESYREL) 50 MG tablet Take 1 tablet (50 mg total) by mouth at bedtime as needed for sleep. Patient not taking: Reported on 09/29/2020 02/02/20   Clapacs, Jackquline Denmark, MD    Allergies    Patient has no known allergies.  Review of Systems   Review of Systems  All other systems reviewed and are negative.   Physical Exam Updated Vital Signs BP 121/65   Pulse 76   Temp 99 F (37.2 C)   Resp 17   Ht 6' (1.829 m)   Wt 81.6 kg   SpO2 94%   BMI 24.41 kg/m   Physical Exam Vitals and nursing note reviewed. Exam conducted with a chaperone present.  Constitutional:      General: He is not in acute distress.    Appearance: Normal appearance. He is not ill-appearing, toxic-appearing or diaphoretic.  HENT:     Head: Normocephalic and atraumatic.  Eyes:     General: No scleral icterus.    Conjunctiva/sclera: Conjunctivae normal.  Cardiovascular:     Rate and Rhythm: Normal rate and regular rhythm.     Pulses: Normal pulses.  Pulmonary:     Effort: Pulmonary effort is normal. No respiratory distress.     Breath sounds: Normal breath sounds. No stridor. No wheezing, rhonchi or rales.     Comments: No increased work of breathing or tachypnea.  Speaks in full sentences.  No distress noted.  CTA  bilaterally. Abdominal:     General: Abdomen is flat. There is no distension.     Palpations: Abdomen is soft.     Tenderness: There is no abdominal tenderness.  Musculoskeletal:     Cervical back: Normal range of motion.     Right lower leg: No edema.     Left lower leg: No edema.  Skin:    General: Skin is dry.     Capillary Refill: Capillary refill takes less than 2 seconds.  Neurological:     Mental Status: He is alert and oriented to person, place, and time.     GCS: GCS eye subscore is 4. GCS verbal subscore is 5. GCS motor subscore is 6.  Psychiatric:        Mood and Affect: Mood normal.        Behavior: Behavior normal.        Thought Content: Thought content normal.  ED Results / Procedures / Treatments   Labs (all labs ordered are listed, but only abnormal results are displayed) Labs Reviewed - No data to display  EKG None  Radiology DG Chest 1 View  Result Date: 09/29/2020 CLINICAL DATA:  Short of breath, palpitations EXAM: CHEST  1 VIEW COMPARISON:  01/22/2020 FINDINGS: Single frontal view of the chest demonstrates a stable cardiac silhouette. No airspace disease, effusion, or pneumothorax. Stable left basilar scarring. No acute bony abnormalities. IMPRESSION: 1. No acute intrathoracic process. Electronically Signed   By: Sharlet Salina M.D.   On: 09/29/2020 16:56    Procedures Procedures (including critical care time)  Medications Ordered in ED Medications  hydrOXYzine (ATARAX/VISTARIL) tablet 50 mg (50 mg Oral Given 09/29/20 1639)    ED Course  I have reviewed the triage vital signs and the nursing notes.  Pertinent labs & imaging results that were available during my care of the patient were reviewed by me and considered in my medical decision making (see chart for details).    MDM Rules/Calculators/A&P                          Patient felt as though his shortness of breath symptoms were related to anxiety.  He states that he has been feeling  stressed recently which has caused him to relapse with IV cocaine.  I emphasized the importance of immediate discontinuation.  Patient had received MRI imaging of his spine to evaluate for discitis given his IVDA in the past.  He also need to be admitted for 6 weeks of antibiotics given his IVDA related infective endocarditis.  He tells me that he will immediately discontinue his substance use and instead look for more appropriate outlets for stress relief.  He was treated with 50 mg hydroxyzine here in the ED and states that he feels entirely improved.  His physical exam was reassuring and there is no abnormal lung sounds.  His plain films of the chest were without any acute cardiopulmonary disease.  EKG with NSR.  He is not demonstrating any increased work of breathing and states that he has no other symptoms.  Do not feel as though laboratory work-up would yield any significant findings.  While he has very obvious risk factors, there was no septic emboli visualized on plain films and he feels entirely improved after hydroxyzine.  Vital signs stable and WNL.  No reported chest pain, pleuritic symptoms, fevers, back pain, or other symptoms.    Patient would like to go home now that he has had complete resolution of his symptoms.  I also encouraged him to discontinue his tobacco use.  We will provide him with outpatient substance use resources.   ED return precautions discussed.  Patient voices understanding and is agreeable to the plan.  The patient was counseled on the dangers of tobacco use, and was advised to quit.  Reviewed strategies to maximize success, including removing cigarettes and smoking materials from environment, stress management, substitution of other forms of reinforcement, support of family/friends and written materials. Total time was 5 min CPT code 27741.    Final Clinical Impression(s) / ED Diagnoses Final diagnoses:  Anxiety  Cocaine use    Rx / DC Orders ED Discharge Orders     None       Lorelee New, PA-C 09/29/20 1745    Lorelee New, PA-C 09/29/20 1746    Bethann Berkshire, MD 09/29/20 2139

## 2020-09-29 NOTE — ED Triage Notes (Signed)
Pt complaining of SOB, pt alert and oriented talking in complete sentences.

## 2020-10-31 ENCOUNTER — Emergency Department (HOSPITAL_COMMUNITY)
Admission: EM | Admit: 2020-10-31 | Discharge: 2020-10-31 | Disposition: A | Discharge status: Home / Self Care | Payer: Self-pay | Attending: Emergency Medicine | Admitting: Emergency Medicine

## 2020-10-31 ENCOUNTER — Encounter (HOSPITAL_COMMUNITY): Payer: Self-pay

## 2020-10-31 ENCOUNTER — Other Ambulatory Visit: Payer: Self-pay

## 2020-10-31 DIAGNOSIS — Z760 Encounter for issue of repeat prescription: Secondary | ICD-10-CM | POA: Insufficient documentation

## 2020-10-31 DIAGNOSIS — F419 Anxiety disorder, unspecified: Secondary | ICD-10-CM | POA: Insufficient documentation

## 2020-10-31 DIAGNOSIS — Z5321 Procedure and treatment not carried out due to patient leaving prior to being seen by health care provider: Secondary | ICD-10-CM | POA: Insufficient documentation

## 2020-10-31 NOTE — ED Triage Notes (Signed)
EMS brought pt in for c/o anxiety. Pt says he is out of his Zyprexa and needs a refill. Pt says he needs this medication to control his anxiety. Pt calm in triage

## 2020-11-17 ENCOUNTER — Encounter (HOSPITAL_COMMUNITY): Payer: Self-pay | Admitting: Emergency Medicine

## 2020-11-17 ENCOUNTER — Emergency Department (HOSPITAL_COMMUNITY)
Admission: EM | Admit: 2020-11-17 | Discharge: 2020-11-19 | Disposition: A | Discharge status: Home / Self Care | Payer: Self-pay | Attending: Emergency Medicine | Admitting: Emergency Medicine

## 2020-11-17 ENCOUNTER — Other Ambulatory Visit: Payer: Self-pay

## 2020-11-17 DIAGNOSIS — R4589 Other symptoms and signs involving emotional state: Secondary | ICD-10-CM | POA: Insufficient documentation

## 2020-11-17 DIAGNOSIS — F17210 Nicotine dependence, cigarettes, uncomplicated: Secondary | ICD-10-CM | POA: Insufficient documentation

## 2020-11-17 DIAGNOSIS — J45909 Unspecified asthma, uncomplicated: Secondary | ICD-10-CM | POA: Insufficient documentation

## 2020-11-17 DIAGNOSIS — F410 Panic disorder [episodic paroxysmal anxiety] without agoraphobia: Secondary | ICD-10-CM | POA: Insufficient documentation

## 2020-11-17 DIAGNOSIS — F419 Anxiety disorder, unspecified: Secondary | ICD-10-CM | POA: Insufficient documentation

## 2020-11-17 DIAGNOSIS — Z20822 Contact with and (suspected) exposure to covid-19: Secondary | ICD-10-CM | POA: Insufficient documentation

## 2020-11-17 DIAGNOSIS — R45851 Suicidal ideations: Secondary | ICD-10-CM | POA: Insufficient documentation

## 2020-11-17 DIAGNOSIS — R000 Tachycardia, unspecified: Secondary | ICD-10-CM | POA: Insufficient documentation

## 2020-11-17 DIAGNOSIS — F1590 Other stimulant use, unspecified, uncomplicated: Secondary | ICD-10-CM | POA: Insufficient documentation

## 2020-11-17 DIAGNOSIS — Z7982 Long term (current) use of aspirin: Secondary | ICD-10-CM | POA: Insufficient documentation

## 2020-11-17 DIAGNOSIS — F1994 Other psychoactive substance use, unspecified with psychoactive substance-induced mood disorder: Secondary | ICD-10-CM

## 2020-11-17 LAB — COMPREHENSIVE METABOLIC PANEL
ALT: 15 U/L (ref 0–44)
AST: 20 U/L (ref 15–41)
Albumin: 4.5 g/dL (ref 3.5–5.0)
Alkaline Phosphatase: 65 U/L (ref 38–126)
Anion gap: 10 (ref 5–15)
BUN: 14 mg/dL (ref 6–20)
CO2: 24 mmol/L (ref 22–32)
Calcium: 9.2 mg/dL (ref 8.9–10.3)
Chloride: 100 mmol/L (ref 98–111)
Creatinine, Ser: 1.05 mg/dL (ref 0.61–1.24)
GFR, Estimated: >60 mL/min (ref >60)
Glucose, Bld: 100 mg/dL — ABNORMAL HIGH (ref 70–99)
Potassium: 2.9 mmol/L — ABNORMAL LOW (ref 3.5–5.1)
Sodium: 134 mmol/L — ABNORMAL LOW (ref 135–145)
Total Bilirubin: 0.9 mg/dL (ref 0.3–1.2)
Total Protein: 8.2 g/dL — ABNORMAL HIGH (ref 6.5–8.1)

## 2020-11-17 LAB — CBC
HCT: 39.4 % (ref 39.0–52.0)
Hemoglobin: 12.5 g/dL — ABNORMAL LOW (ref 13.0–17.0)
MCH: 26.2 pg (ref 26.0–34.0)
MCHC: 31.7 g/dL (ref 30.0–36.0)
MCV: 82.6 fL (ref 80.0–100.0)
Platelets: 288 10*3/uL (ref 150–400)
RBC: 4.77 MIL/uL (ref 4.22–5.81)
RDW: 14.3 % (ref 11.5–15.5)
WBC: 4.9 10*3/uL (ref 4.0–10.5)
nRBC: 0 % (ref 0.0–0.2)

## 2020-11-17 LAB — ETHANOL: Alcohol, Ethyl (B): <10 mg/dL (ref <10)

## 2020-11-17 LAB — SALICYLATE LEVEL: Salicylate Lvl: <7 mg/dL — ABNORMAL LOW (ref 7.0–30.0)

## 2020-11-17 LAB — ACETAMINOPHEN LEVEL: Acetaminophen (Tylenol), Serum: <10 ug/mL — ABNORMAL LOW (ref 10–30)

## 2020-11-17 MED ORDER — BUPRENORPHINE HCL-NALOXONE HCL 8-2 MG SL SUBL
1.0000 | SUBLINGUAL_TABLET | Freq: Every day | SUBLINGUAL | Status: DC
  Administered 2020-11-18 – 2020-11-19 (×2): 1 via SUBLINGUAL
  Filled 2020-11-17 (×2): qty 1

## 2020-11-17 MED ORDER — POTASSIUM CHLORIDE CRYS ER 20 MEQ PO TBCR
40.0000 meq | EXTENDED_RELEASE_TABLET | ORAL | Status: AC
  Administered 2020-11-17 – 2020-11-18 (×2): 40 meq via ORAL
  Filled 2020-11-17 (×2): qty 2

## 2020-11-17 MED ORDER — HYDROXYZINE HCL 25 MG PO TABS: 25.0000 mg | ORAL_TABLET | Freq: Three times a day (TID) | ORAL | Status: DC | PRN

## 2020-11-17 MED ORDER — OLANZAPINE 5 MG PO TABS
5.0000 mg | ORAL_TABLET | Freq: Every day | ORAL | Status: DC
  Administered 2020-11-18 – 2020-11-19 (×3): 5 mg via ORAL
  Filled 2020-11-17 (×3): qty 1

## 2020-11-17 NOTE — ED Notes (Signed)
Pt changed into scrubs, pt wanded by security.  Pt belonging 2 bags placed in belonging area labeled.

## 2020-11-17 NOTE — ED Triage Notes (Signed)
Pt c/o panic attacks and states he feels hopeless.  Pt states he is out of Zyprexa and needs a refill.  Pt states he wants to kill himself, and would do it by overdose.

## 2020-11-17 NOTE — ED Notes (Signed)
Requested urine from pt pt unable to give at this time

## 2020-11-17 NOTE — ED Provider Notes (Signed)
[facility] EMERGENCY DEPARTMENT Provider Note   CSN: [id number] Arrival date & time: 11/17/20  1933     History Chief Complaint  Patient presents with  . Panic Attack    Nathaniel Hansen is a 34 y.o. male.  HPI      Nathaniel Hansen is a 34 y.o. male with past medical history of anxiety, who presents to the Emergency Department complaining of increased anxiety and feelings of hopelessness. He states that he was residing at a recovery facility in [place] and recently moved back to the area. He describes having difficulties with increasing anxiety and panic attacks. He states that he feels as though his heart races at times primarily when he feels anxious. No chest pain. He was taking hydroxyzine and Zyprexa as ran out of his Zyprexa 1 to 2 weeks ago. He reports having increasing stressors and depression recently with thoughts of suicide. He feels that he does not have any support.  he notes plan of overdosing on pills. He denies fever, chills, shortness of breath, abdominal pain, recent illness or known Covid exposures. He admits to recent marijuana use, but denies other illicit drugs. He is requesting help with his symptoms.    Past Medical History:  Diagnosis Date  . Anxiety   . Asthma   . Cocaine abuse (HCC)   . Endocarditis   . Opioid abuse [facility])     Patient Active Problem List   Diagnosis Date Noted  . 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 Severe recurrent major depression without psychotic features (HCC) 01/27/2020  . Polysubstance abuse (HCC) 11/08/2019  . Streptococcal sepsis, unspecified (HCC) 10/27/2019  . Opioid dependence (HCC) 10/23/2019  . Encounter for monitoring Suboxone maintenance therapy 10/23/2019  . Leukocytosis 10/23/2019  . Fever, unspecified 10/23/2019  . 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   . 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 Streptococcal Mitis/Oralis Bacteremia 10/08/2019  . Septic shock (HCC) 10/07/2019  . CAP (community acquired pneumonia) 10/07/2019  . Anemia 10/07/2019  . Lactic acidosis 10/07/2019  . Chronic back pain 10/07/2019    History reviewed. No pertinent surgical history.     History reviewed. No pertinent family history.  Social History   Tobacco Use  . Smoking status: Current Every Day Smoker    Packs/day: 1.00    Types: Cigarettes  . Smokeless tobacco: Never Used  Vaping Use  . Vaping Use: Never used  Substance Use Topics  . Alcohol use: Yes    Comment: sometimes  . Drug use: Yes    Frequency: 7.0 times per week    Types: Marijuana, Heroin    Comment: "sometimes" opioids, heroin     Home Medications Prior to Admission medications   Medication Sig Start Date End Date Taking? Authorizing Provider  Aspirin-Acetaminophen (GOODYS BODY PAIN PO) Take 1 packet by mouth daily as needed.   Yes [provider]  buprenorphine (SUBUTEX) 8 MG SUBL SL tablet Place 8 mg under the tongue daily.   Yes [provider]  hydrOXYzine (ATARAX/VISTARIL) 25 MG tablet Take 1 tablet (25 mg total) by mouth 3 (three) times daily as needed for anxiety. 02/02/20  Yes Clapacs, Jackquline Denmark, MD  OLANZapine (ZYPREXA) 5 MG tablet Take 5 mg by mouth daily. 08/11/20  Yes [provider]  buprenorphine-naloxone (SUBOXONE) 2-0.5 mg SUBL SL tablet Place 1 tablet under the tongue daily. Patient  not taking: Reported on 09/29/2020 02/03/20   Clapacs, Jackquline Denmark, MD  buprenorphine-naloxone (SUBOXONE) 8-2 mg SUBL SL tablet Place 2 tablets under the tongue daily. Patient not taking: Reported on 09/29/2020 02/03/20   Clapacs, Jackquline Denmark, MD  citalopram (CELEXA) 20 MG tablet Take 1 tablet (20 mg total) by mouth daily. Patient not taking: Reported on 11/17/2020 02/03/20   Clapacs, Jackquline Denmark, MD  traZODone (DESYREL) 50 MG tablet Take 1 tablet  (50 mg total) by mouth at bedtime as needed for sleep. Patient not taking: Reported on 09/29/2020 02/02/20   Clapacs, Jackquline Denmark, MD    Allergies    Patient has no known allergies.  Review of Systems   Review of Systems  Constitutional: Negative for chills, fatigue and fever.  HENT: Negative for sore throat and trouble swallowing.   Respiratory: Negative for cough, shortness of breath and wheezing.   Cardiovascular: Positive for palpitations. Negative for chest pain.  Gastrointestinal: Negative for abdominal pain, blood in stool, nausea and vomiting.  Genitourinary: Negative for dysuria, flank pain and hematuria.  Musculoskeletal: Negative for arthralgias, back pain, myalgias, neck pain and neck stiffness.  Skin: Negative for rash.  Neurological: Negative for dizziness, weakness and numbness.  Hematological: Does not bruise/bleed easily.  Psychiatric/Behavioral: Positive for suicidal ideas. Negative for agitation, confusion and hallucinations. The patient is nervous/anxious.     Physical Exam Updated Vital Signs BP (!) 148/99 (BP Location: Right Arm)   Pulse (!) 110   Temp 99.7 F (37.6 C) (Oral)   Resp 16   Ht 6' (1.829 m)   Wt 83.9 kg   SpO2 100%   BMI 25.09 kg/m   Physical Exam Vitals and nursing note reviewed.  Constitutional:      General: He is not in acute distress.    Appearance: Normal appearance. He is not toxic-appearing.  Eyes:     Conjunctiva/sclera: Conjunctivae normal.  Cardiovascular:     Rate and Rhythm: Regular rhythm. Tachycardia present.     Pulses: Normal pulses.  Pulmonary:     Effort: Pulmonary effort is normal.     Breath sounds: Normal breath sounds.  Abdominal:     Palpations: Abdomen is soft.     Tenderness: There is no abdominal tenderness.  Musculoskeletal:        General: Normal range of motion.     Cervical back: Normal range of motion.     Right lower leg: No edema.     Left lower leg: No edema.  Skin:    General: Skin is warm.      Capillary Refill: Capillary refill takes less than 2 seconds.     Findings: No rash.  Neurological:     General: No focal deficit present.     Mental Status: He is alert.  Psychiatric:        Attention and Perception: Attention normal.        Mood and Affect: Mood is depressed.        Speech: Speech normal.        Behavior: Behavior normal. Behavior is cooperative.        Thought Content: Thought content is not paranoid. Thought content includes suicidal ideation. Thought content does not include homicidal ideation. Thought content includes suicidal plan.     ED Results / Procedures / Treatments   Labs (all labs ordered are listed, but only abnormal results are displayed) Labs Reviewed  COMPREHENSIVE METABOLIC PANEL - Abnormal; Notable for the following components:  Result Value   Sodium 134 (*)    Potassium 2.9 (*)    Glucose, Bld 100 (*)    Total Protein 8.2 (*)    All other components within normal limits  SALICYLATE LEVEL - Abnormal; Notable for the following components:   Salicylate Lvl <7.0 (*)    All other components within normal limits  ACETAMINOPHEN LEVEL - Abnormal; Notable for the following components:   Acetaminophen (Tylenol), Serum <10 (*)    All other components within normal limits  CBC - Abnormal; Notable for the following components:   Hemoglobin 12.5 (*)    All other components within normal limits  ETHANOL  RAPID URINE DRUG SCREEN, HOSP PERFORMED    EKG None  Radiology No results found.  Procedures Procedures (including critical care time)  Medications Ordered in ED Medications  potassium chloride SA (KLOR-CON) CR tablet 40 mEq (40 mEq Oral Given 11/17/20 2337)  hydrOXYzine (ATARAX/VISTARIL) tablet 25 mg (has no administration in time range)  OLANZapine (ZYPREXA) tablet 5 mg (has no administration in time range)  buprenorphine-naloxone (SUBOXONE) 8-2 mg per SL tablet 1 tablet (has no administration in time range)    ED Course  I have  reviewed the triage vital signs and the nursing notes.  Pertinent labs & imaging results that were available during my care of the patient were reviewed by me and considered in my medical decision making (see chart for details).    MDM Rules/Calculators/A&P                          Patient here with reported increasing anxiety and depression. Reports feelings of hopelessness. Suicidal ideation with plan of overdosing on pills. No homicidal thoughts. Does not appear to be responding to internal stimuli. Denies auditory or visual hallucinations. Currently he is calm and cooperative. He is voluntary and requesting help with his current symptoms. Will obtain labs, urinalysis and likely TTS consult.  On review of labs, patient noted to be hypokalemic with potassium of 2.9 history of same. No chest pain. Discussed work-up with Dr. Blinda Leatherwood who assumes care. Patient ordered oral potassium q. 2 doses. Remaining labs unremarkable, urinalysis pending. Nighttime medications ordered. He will likely be medically cleared and TTS consult pending   Final Clinical Impression(s) / ED Diagnoses Final diagnoses:  None    Rx / DC Orders ED Discharge Orders    None       Pauline Aus, PA-C 11/17/20 2357    Terrilee Files, MD 11/18/20 1253

## 2020-11-18 LAB — RAPID URINE DRUG SCREEN, HOSP PERFORMED
Amphetamines: POSITIVE — AB
Barbiturates: NOT DETECTED
Benzodiazepines: NOT DETECTED
Cocaine: NOT DETECTED
Opiates: NOT DETECTED
Tetrahydrocannabinol: POSITIVE — AB

## 2020-11-18 NOTE — BH Assessment (Signed)
Comprehensive Clinical Assessment (CCA) Note  11/18/2020 Nathaniel Hansen Nathaniel Hansen   Disposition: per Marciano Sequin, NP, pt. recommended for inpatient   Pt. is a 34 year old male who presents voluntarily to [facility]  . Pt was accompanied by himself reporting symptoms of depression with suicidal ideation with plan to overdose on illicit drugs. Pt has a history of anxiety , depression and bi polar disorder and he was referred for assessment by ED doctors at [facility] . Pt reports medication Zyprexa and Vistaril but is currently out of his Zyprexa medication. Pt reports current suicidal ideations with plans to overdose on illicit drugs. Past attempts include in  2015 by  overdose. Pt acknowledges multiple symptoms of Depression, including anhedonia, isolating, feelings of worthlessness & guilt, tearfulness, changes in sleep & appetite, & increased irritability. Pt reports homicidal ideation/ denies  history of violence. Pt denies auditory & visual hallucinations or other symptoms of psychosis. Pt states current stressors include " life, relationships with family, over stressing, lack of financial and family support."  Pt lives with his parents , and supports include his family but very limited . Pt denies a hx of abuse and trauma. Pt denies there is a family history of mental health or substance use.  Pt's is unemployed . Pt has fair insight and judgment. Pt's memory is intact.  Pt. denies any legal history at this time.   Pt's denies OP history.  Pt.'s IP history includes an inpatient at [facility] and [facility] . Last admission was at [facility] about 4-5 months   Pt reports current and history of substance abuse. Patient uses 1-2 grams of Heroin daily. Pt. reports using since age 77. Pt.denies ETOH use.   MSE: Pt is casually dressed in scrubs, alert, oriented x5 with normal speech and normal motor behavior. Eye contact is good. Pt's mood is depressed and affect is depressed and hopeless. Affect is  congruent with mood. Thought process is coherent and relevant. There is no indication Pt is currently responding to internal stimuli or experiencing delusional thought content. Pt was cooperative throughout assessment.  Disposition: per Marciano Sequin, NP, pt. recommended for inpatient   Chief Complaint:  Chief Complaint  Patient presents with   Panic Attack   Visit Diagnosis: Panic Attack    CCA Screening, Triage and Referral (STR)  Patient Reported Information How did you hear about [place]? Self  Referral name: No data recorded Referral phone number: No data recorded  Whom do you see for routine medical problems? I don't have a doctor  Practice/Facility Name: No data recorded Practice/Facility Phone Number: No data recorded Name of Contact: No data recorded Contact Number: No data recorded Contact Fax Number: No data recorded Prescriber Name: No data recorded Prescriber Address (if known): No data recorded  What Is the Reason for Your Visit/Call Today? Depression / Panic Attacks  How Long Has This Been Causing You Problems? > than 6 months  What Do You Feel Would Help You the Most Today? Medication; Other (Comment) (Detox)   Have You Recently Been in Any Inpatient Treatment (Hospital/Detox/Crisis Center/28-Day Program)? Yes  Name/Location of Program/Hospital:[facility]/ detox  How Long Were You There? 7 days  When Were You Discharged?  (4-5 mnths ago)   Have You Ever Received Services From [facility] Before? Yes  Who Do You See at [facility]? [facility] inpatient   Have You Recently Had Any Thoughts About Hurting Yourself? Yes (everday with plan to OD)  Are You Planning to Commit Suicide/Harm  Yourself At This time? Yes   Have you Recently Had Thoughts About Hurting Someone Karolee Ohs? Yes  Explanation: No data recorded  Have You Used Any Alcohol or Drugs in the Past 24 Hours? No  How Long Ago Did You Use Drugs or Alcohol? No data recorded What Did You Use and  How Much? No data recorded  Do You Currently Have a Therapist/Psychiatrist? No  Name of Therapist/Psychiatrist: No data recorded  Have You Been Recently Discharged From Any Office Practice or Programs? No  Explanation of Discharge From Practice/Program: No data recorded    CCA Screening Triage Referral Assessment Type of Contact: Tele-Assessment  Is this Initial or Reassessment? Initial Assessment  Date Telepsych consult ordered in [facility]:  11/18/2020  Time Telepsych consult ordered in [facility]:  2330   Patient Reported Information Reviewed? Yes  Patient Left Without Being Seen? No data recorded Reason for Not Completing Assessment: No data recorded  Collateral Involvement: No data recorded  Does Patient Have a Court Appointed Legal Guardian? No data recorded Name and Contact of Legal Guardian: self  If Minor and Not Living with Parent(s), Who has Custody? No data recorded Is CPS involved or ever been involved? Never  Is APS involved or ever been involved? Never   Patient Determined To Be At Risk for Harm To Self or Others Based on Review of Patient Reported Information or Presenting Complaint? Yes, for Self-Harm  Method: No data recorded Availability of Means: No data recorded Intent: No data recorded Notification Required: No data recorded Additional Information for Danger to Others Potential: No data recorded Additional Comments for Danger to Others Potential: No data recorded Are There Guns or Other Weapons in Your Home? No  Types of Guns/Weapons: No data recorded Are These Weapons Safely Secured?                            No data recorded Who Could Verify You Are Able To Have These Secured: No data recorded Do You Have any Outstanding Charges, Pending Court Dates, Parole/Probation? No data recorded Contacted To Inform of Risk of Harm To Self or Others: No data recorded  Location of Assessment: [facility]   Does Patient Present under Involuntary Commitment? No  IVC  Papers Initial File Date: No data recorded  [place] of Residence: [place]   Patient Currently Receiving the Following Services: Not Receiving Services   Determination of Need: Emergent (2 hours)   Options For Referral: Inpatient Hospitalization     CCA Biopsychosocial Intake/Chief Complaint:  Depression / Panic Attacks / SI w/ Plan to OD  Current Symptoms/Problems: hopelessness, lack of sleep , substance use , feeling down , lonelesness   Patient Reported Schizophrenia/Schizoaffective Diagnosis in Past: No   Strengths: No data recorded Preferences: No data recorded Abilities: No data recorded  Type of Services Patient Feels are Needed: No data recorded  Initial Clinical Notes/Concerns: No data recorded  Mental Health Symptoms Depression:  Hopelessness; Worthlessness; Change in energy/activity   Duration of Depressive symptoms: Greater than two weeks   Mania:  None   Anxiety:   Difficulty concentrating; Worrying; Sleep   Psychosis:  None   Duration of Psychotic symptoms: No data recorded  Trauma:  None   Obsessions:  N/A   Compulsions:  None   Inattention:  None   Hyperactivity/Impulsivity:  N/A   Oppositional/Defiant Behaviors:  None   Emotional Irregularity:  Chronic feelings of emptiness; Recurrent suicidal behaviors/gestures/threats   Other Mood/Personality  Symptoms:  No data recorded   Mental Status Exam Appearance and self-care  Stature:  Tall   Weight:  Average weight   Clothing:  -- (scrubs)   Grooming:  Normal   Cosmetic use:  None   Posture/gait:  Normal   Motor activity:  Not Remarkable   Sensorium  Attention:  Normal   Concentration:  Normal   Orientation:  X5   Recall/memory:  Normal   Affect and Mood  Affect:  Depressed   Mood:  Depressed; Worthless; Hopeless   Relating  Eye contact:  Normal   Facial expression:  Depressed; Sad   Attitude toward examiner:  Cooperative   Thought and Language  Speech flow:  Normal   Thought content:  Appropriate to Mood and Circumstances   Preoccupation:  None   Hallucinations:  None   Organization:  No data recorded  [employer] of Knowledge:  Average   Intelligence:  Average   Abstraction:  Normal   Judgement:  Fair   Reality Testing:  Unaware   Insight:  Fair   Decision Making:  Normal   Social Functioning  Social Maturity:  Responsible   Social Judgement:  Normal   Stress  Stressors:  Surveyor, quantity; Relationship; Work   Coping Ability:  Deficient supports   Neurosurgeon:  Decision making   Supports:  Family (But limited)     Religion: Religion/Spirituality Are You A Religious Person?: No  Leisure/Recreation: Leisure / Recreation Do You Have Hobbies?: No  Exercise/Diet: Exercise/Diet Do You Exercise?: No Have You Gained or Lost A Significant Amount of Weight in the Past Six Months?: No Do You Follow a Special Diet?: No Do You Have Any Trouble Sleeping?: No   CCA Employment/Education Employment/Work Situation: Employment / Work Psychologist, occupational Employment situation: Unemployed Has patient ever been in the [employer]?: No  Education: Education Is Patient Currently Attending School?: No Did Garment/textile technologist From [employer]?: No Did You Product manager?: No Did Designer, television/film set?: No Did You Have An Individualized Education Program (IIEP): No Did You Have Any Difficulty At [employer]?: No Patient's Education Has Been Impacted by Current Illness: No   CCA Family/Childhood History Family and Relationship History:    Childhood History:     Child/Adolescent Assessment:     CCA Substance Use Alcohol/Drug Use: Alcohol / Drug Use Pain Medications: SEE MAR Prescriptions: SEE MAR Over the Counter: SEE MAR History of alcohol / drug use?: Yes Substance #1 Name of Substance 1: Heroin 1 - Age of First Use: 26 1 - Amount (size/oz): 1-2 grams 1 - Frequency: daily 1 - Duration: 8 years 1 - Last Use /  Amount: 2 days ago                       ASAM's:  Six Dimensions of Multidimensional Assessment  Dimension 1:  Acute Intoxication and/or Withdrawal Potential:      Dimension 2:  Biomedical Conditions and Complications:      Dimension 3:  Emotional, Behavioral, or Cognitive Conditions and Complications:     Dimension 4:  Readiness to Change:     Dimension 5:  Relapse, Continued use, or Continued Problem Potential:     Dimension 6:  Recovery/Living Environment:     ASAM Severity Score:    ASAM Recommended Level of Treatment:     Substance use Disorder (SUD)    Recommendations for Services/Supports/Treatments:    DSM5 Diagnoses: Patient Active Problem List   Diagnosis Date Noted  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) 01/27/2020   Severe recurrent major depression without psychotic features (HCC) 01/27/2020   Polysubstance abuse (HCC) 11/08/2019   Streptococcal sepsis, unspecified (HCC) 10/27/2019   Opioid dependence (HCC) 10/23/2019   Encounter for monitoring Suboxone maintenance therapy 10/23/2019   Leukocytosis 10/23/2019   Fever, unspecified 10/23/2019   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 10/09/2019   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 10/09/2019   Streptococcal Mitis/Oralis Bacteremia 10/08/2019   Septic shock (HCC) 10/07/2019   CAP (community acquired pneumonia) 10/07/2019   Anemia 10/07/2019   Lactic acidosis 10/07/2019   Chronic back pain 10/07/2019    Patient Centered Plan: Patient is on the following Treatment Plan(s):    Referrals to Alternative Service(s): Referred to Alternative Service(s):   Place:   Date:   Time:    Referred to Alternative Service(s):   Place:   Date:   Time:    Referred to Alternative Service(s):   Place:   Date:   Time:    Referred to Alternative Service(s):    Place:   Date:   Time:     Rachel MouldsKellice  Nolah Krenzer, [place]

## 2020-11-18 NOTE — ED Notes (Signed)
PT TTS complete. Pt has been recommended for inpatient placement by Marciano Sequin, NP. MD made aware.

## 2020-11-18 NOTE — Care Management (Signed)
Writer referred patient to the following facilities:   [facility] Details Fax  [address] [place] [address]  Internal comment [facility] Details Fax  [address] [place] [address]  Internal comment [facility] Details Fax  [address] [place] [address]  Internal comment [facility] Details Fax  [address] [place], [place] [address]  Internal comment [facility] Details Fax  [address] [place] [address]  Internal comment [facility] Details Fax  [phone]. [address], [place] [address]  Internal comment [facility] Details Fax  [address] [place] [address]  Internal comment [facility] Details Fax  [address] [place] [address]  Internal comment [facility] Details Fax  [address] [place] [address]  Internal comment [facility] Details Fax  [address] [place] [address]s., [place] [address]  Internal comment [facility] Details Fax  [address], [place] [address]  Internal comment [facility] Details Fax  [address] [place] [address]  Internal comment [facility] Details Fax  [address] [place] [address]  Internal comment [facility] Details Fax  [address] [place], [place] [address]  Internal comment [facility] Details Fax  [address] [place] [address]  Internal comment

## 2020-11-18 NOTE — ED Notes (Signed)
Pt is stable and a/w BH placement

## 2020-11-19 ENCOUNTER — Inpatient Hospital Stay (HOSPITAL_COMMUNITY)
Admission: AD | Admit: 2020-11-19 | Discharge: 2020-11-27 | DRG: 885 | Disposition: A | Discharge status: Home / Self Care | Payer: Federal, State, Local not specified - Other | Source: Intra-hospital | Attending: Psychiatry | Admitting: Psychiatry

## 2020-11-19 ENCOUNTER — Other Ambulatory Visit: Payer: Self-pay | Admitting: Psychiatric/Mental Health

## 2020-11-19 DIAGNOSIS — R45851 Suicidal ideations: Secondary | ICD-10-CM | POA: Diagnosis present

## 2020-11-19 DIAGNOSIS — Z20822 Contact with and (suspected) exposure to covid-19: Secondary | ICD-10-CM | POA: Diagnosis present

## 2020-11-19 DIAGNOSIS — F1910 Other psychoactive substance abuse, uncomplicated: Secondary | ICD-10-CM | POA: Diagnosis present

## 2020-11-19 DIAGNOSIS — F1210 Cannabis abuse, uncomplicated: Secondary | ICD-10-CM | POA: Diagnosis present

## 2020-11-19 DIAGNOSIS — F1510 Other stimulant abuse, uncomplicated: Secondary | ICD-10-CM | POA: Diagnosis present

## 2020-11-19 DIAGNOSIS — F1590 Other stimulant use, unspecified, uncomplicated: Secondary | ICD-10-CM | POA: Diagnosis present

## 2020-11-19 DIAGNOSIS — F323 Major depressive disorder, single episode, severe with psychotic features: Secondary | ICD-10-CM

## 2020-11-19 DIAGNOSIS — G4700 Insomnia, unspecified: Secondary | ICD-10-CM | POA: Diagnosis present

## 2020-11-19 DIAGNOSIS — K5900 Constipation, unspecified: Secondary | ICD-10-CM | POA: Diagnosis present

## 2020-11-19 DIAGNOSIS — F332 Major depressive disorder, recurrent severe without psychotic features: Secondary | ICD-10-CM | POA: Diagnosis present

## 2020-11-19 DIAGNOSIS — K30 Functional dyspepsia: Secondary | ICD-10-CM | POA: Diagnosis present

## 2020-11-19 DIAGNOSIS — F1120 Opioid dependence, uncomplicated: Secondary | ICD-10-CM | POA: Diagnosis present

## 2020-11-19 DIAGNOSIS — F1994 Other psychoactive substance use, unspecified with psychoactive substance-induced mood disorder: Secondary | ICD-10-CM

## 2020-11-19 DIAGNOSIS — R6521 Severe sepsis with septic shock: Secondary | ICD-10-CM | POA: Diagnosis present

## 2020-11-19 DIAGNOSIS — F17210 Nicotine dependence, cigarettes, uncomplicated: Secondary | ICD-10-CM | POA: Diagnosis present

## 2020-11-19 DIAGNOSIS — F333 Major depressive disorder, recurrent, severe with psychotic symptoms: Principal | ICD-10-CM | POA: Diagnosis present

## 2020-11-19 DIAGNOSIS — A419 Sepsis, unspecified organism: Secondary | ICD-10-CM | POA: Diagnosis present

## 2020-11-19 HISTORY — DX: Bipolar disorder, unspecified: F31.9

## 2020-11-19 HISTORY — DX: Depression, unspecified: F32.A

## 2020-11-19 LAB — RESP PANEL BY RT-PCR (FLU A&B, COVID) ARPGX2
Influenza A by PCR: NEGATIVE
Influenza B by PCR: NEGATIVE
SARS Coronavirus 2 by RT PCR: NEGATIVE

## 2020-11-19 MED ORDER — CITALOPRAM HYDROBROMIDE 10 MG PO TABS
10.0000 mg | ORAL_TABLET | Freq: Every day | ORAL | Status: DC
  Administered 2020-11-20: 10 mg via ORAL
  Filled 2020-11-19 (×3): qty 1

## 2020-11-19 MED ORDER — MAGNESIUM HYDROXIDE 400 MG/5ML PO SUSP: 30.0000 mL | Freq: Every day | ORAL | Status: DC | PRN

## 2020-11-19 MED ORDER — HYDROXYZINE HCL 25 MG PO TABS
25.0000 mg | ORAL_TABLET | Freq: Four times a day (QID) | ORAL | Status: DC | PRN
  Administered 2020-11-19 – 2020-11-26 (×9): 25 mg via ORAL
  Filled 2020-11-19 (×12): qty 1

## 2020-11-19 MED ORDER — TRAZODONE HCL 50 MG PO TABS
50.0000 mg | ORAL_TABLET | Freq: Every evening | ORAL | Status: DC | PRN
  Administered 2020-11-19 – 2020-11-26 (×6): 50 mg via ORAL
  Filled 2020-11-19 (×7): qty 1
  Filled 2020-11-19: qty 7

## 2020-11-19 MED ORDER — BUPRENORPHINE HCL-NALOXONE HCL 8-2 MG SL SUBL
1.0000 | SUBLINGUAL_TABLET | Freq: Every day | SUBLINGUAL | Status: DC
  Administered 2020-11-20 – 2020-11-23 (×4): 1 via SUBLINGUAL
  Filled 2020-11-19 (×4): qty 1

## 2020-11-19 MED ORDER — ACETAMINOPHEN 325 MG PO TABS: 650.0000 mg | ORAL_TABLET | Freq: Four times a day (QID) | ORAL | Status: DC | PRN

## 2020-11-19 MED ORDER — OLANZAPINE 5 MG PO TABS
5.0000 mg | ORAL_TABLET | Freq: Every day | ORAL | Status: DC
  Filled 2020-11-19: qty 1

## 2020-11-19 MED ORDER — CITALOPRAM HYDROBROMIDE 20 MG PO TABS
10.0000 mg | ORAL_TABLET | Freq: Every day | ORAL | Status: DC
  Administered 2020-11-19: 14:00:00 10 mg via ORAL
  Filled 2020-11-19 (×2): qty 0.5

## 2020-11-19 MED ORDER — ALUM & MAG HYDROXIDE-SIMETH 200-200-20 MG/5ML PO SUSP: 30.0000 mL | ORAL | Status: DC | PRN

## 2020-11-19 NOTE — ED Notes (Signed)
Called Pharmacy to bring Celexa

## 2020-11-19 NOTE — ED Notes (Signed)
Patient in hallway at this time. Patient has been asleep since I started sitting with him @ 0300 am. Patient has equal rise and fall of chest. No distress noted

## 2020-11-19 NOTE — ED Notes (Signed)
Meal tray placed on bedside table. Resting at this time. 

## 2020-11-19 NOTE — Consult Note (Signed)
Telepsych Consultation   Location of Patient: [facility] Location of Provider: [facility]  Patient Identification: Nathaniel Hansen MRN:  [id number] Principal Diagnosis: Substance induced mood disorder (HCC) Diagnosis:  Principal Problem:   Substance induced mood disorder (HCC)   Total Time spent with patient: 30 minutes  HPI:  Reassessment: Patient seen via telepsych. Chart reviewed. [name] is a 34 year old male with history of depression, anxiety, bipolar disorder and polysubstance use disorder who presented to [facility] voluntarily with reports of anxiety with suicidal ideation to overdose.  On assessment today, patient appears depressed. He states he had been in [facility] in [place] but left the program early two months ago due to feeling better and now feels this was a mistake. He has been staying with his parents. He reports worsening depression and substance use over the last month and states he ran out of his Zyprexa one month ago. He reports ongoing SI today. Denies HI/AVH. He shows no signs of responding to internal stimuli. He receives Suboxone treatment through [facility] but admits to skipping Suboxone some days and using pain pills. He also reports cocaine and meth use twice in the last month. He reports decreased anxiety but ongoing depression, hopelessness, and SI. Reports good sleep but low energy. He has been restarted on Zyprexa. He reports history of good response to Celexa.   Per TTS assessment 11/18/20: Pt. is a 34 year old male who presents voluntarily to [facility]  . Pt was accompanied by himself reporting symptoms of depression with suicidal ideation with plan to overdose on illicit drugs. Pt has a history of anxiety , depression and bi polar disorder and he was referred for assessment by ED doctors at [facility] . Pt reports medication Zyprexa and Vistaril but is currently out of his Zyprexa medication. Pt reports current suicidal ideations with plans to  overdose on illicit drugs. Past attempts include in  2015 by  overdose. Pt acknowledges multiple symptoms of Depression, including anhedonia, isolating, feelings of worthlessness & guilt, tearfulness, changes in sleep & appetite, & increased irritability. Pt reports homicidal ideation/ denies  history of violence. Pt denies auditory & visual hallucinations or other symptoms of psychosis. Pt states current stressors include " life, relationships with family, over stressing, lack of financial and family support."  Pt lives with his parents , and supports include his family but very limited . Pt denies a hx of abuse and trauma. Pt denies there is a family history of mental health or substance use.  Pt's is unemployed . Pt has fair insight and judgment. Pt's memory is intact.  Pt. denies any legal history at this time.   Pt's denies OP history.  Pt.'s IP history includes an inpatient at [facility] . Last admission was at [facility] about 4-5 months   Pt reports current and history of substance abuse. Patient uses 1-2 grams of Heroin daily. Pt. reports using since age 12. Pt.denies ETOH use.   Disposition: Continue to recommend inpatient psychiatric treatment. Will start Celexa, continue Zyprexa and PRN Vistaril. CSW and ED staff updated.  Past Psychiatric History: See above  Risk to Self:   Risk to Others:   Prior Inpatient Therapy:   Prior Outpatient Therapy:    Past Medical History:  Past Medical History:  Diagnosis Date  . Anxiety   . Asthma   . Cocaine abuse (HCC)   . Endocarditis   . Opioid abuse (HCC)    History reviewed. No pertinent surgical history. Family History:  History reviewed. No pertinent family history. Family Psychiatric  History: Unknown Social History:  Social History   Substance and Sexual Activity  Alcohol Use Yes   Comment: sometimes     Social History   Substance and Sexual Activity  Drug Use Yes  . Frequency: 7.0 times per week  . Types:  Marijuana, Heroin   Comment: "sometimes" opioids, heroin     Social History   Socioeconomic History  . Marital status: Single    Spouse name: Not on file  . Number of children: Not on file  . Years of education: Not on file  . Highest education level: Not on file  Occupational History  . Not on file  Tobacco Use  . Smoking status: Current Every Day Smoker    Packs/day: 1.00    Types: Cigarettes  . Smokeless tobacco: Never Used  Vaping Use  . Vaping Use: Never used  Substance and Sexual Activity  . Alcohol use: Yes    Comment: sometimes  . Drug use: Yes    Frequency: 7.0 times per week    Types: Marijuana, Heroin    Comment: "sometimes" opioids, heroin   . Sexual activity: Not on file  Other Topics Concern  . Not on file  Social History Narrative  . Not on file   Social Determinants of Health   Financial Resource Strain: Not on file  Food Insecurity: Not on file  Transportation Needs: Not on file  Physical Activity: Not on file  Stress: Not on file  Social Connections: Not on file   Additional Social History:    Allergies:  No Known Allergies  Labs:  Results for orders placed or performed during the hospital encounter of 11/17/20 (from the past 48 hour(s))  Comprehensive metabolic panel     Status: Abnormal   Collection Time: 11/17/20 10:39 PM  Result Value Ref Range   Sodium 134 (L) 135 - 145 mmol/L   Potassium 2.9 (L) 3.5 - 5.1 mmol/L   Chloride 100 98 - 111 mmol/L   CO2 24 22 - 32 mmol/L   Glucose, Bld 100 (H) 70 - 99 mg/dL    Comment: Glucose reference range applies only to samples taken after fasting for at least 8 hours.   BUN 14 6 - 20 mg/dL   Creatinine, Ser 8.751.05 0.61 - 1.24 mg/dL   Calcium 9.2 8.9 - 64.310.3 mg/dL   Total Protein 8.2 (H) 6.5 - 8.1 g/dL   Albumin 4.5 3.5 - 5.0 g/dL   AST 20 15 - 41 U/L   ALT 15 0 - 44 U/L   Alkaline Phosphatase 65 38 - 126 U/L   Total Bilirubin 0.9 0.3 - 1.2 mg/dL   GFR, Estimated >32>60 >95>60 mL/min    Comment:  (NOTE) Calculated using the CKD-EPI Creatinine Equation (2021)    Anion gap 10 5 - 15    Comment: Performed at [facility], [address] [place], [place] [address]  Ethanol     Status: None   Collection Time: 11/17/20 10:39 PM  Result Value Ref Range   Alcohol, Ethyl (B) <10 <10 mg/dL    Comment: (NOTE) Lowest detectable limit for serum alcohol is 10 mg/dL.  For medical purposes only. Performed at [facility], [address] [place], [place] [address]   Salicylate level     Status: Abnormal   Collection Time: 11/17/20 10:39 PM  Result Value Ref Range   Salicylate Lvl <7.0 (L) 7.0 - 30.0 mg/dL    Comment: Performed  at [facility], [address] [place], [place] [address]  Acetaminophen level     Status: Abnormal   Collection Time: 11/17/20 10:39 PM  Result Value Ref Range   Acetaminophen (Tylenol), Serum <10 (L) 10 - 30 ug/mL    Comment: (NOTE) Therapeutic concentrations vary significantly. A range of 10-30 ug/mL  may be an effective concentration for many patients. However, some  are best treated at concentrations outside of this range. Acetaminophen concentrations >150 ug/mL at 4 hours after ingestion  and >50 ug/mL at 12 hours after ingestion are often associated with  toxic reactions.  Performed at [facility], [address] [place], [place] [address]   cbc     Status: Abnormal   Collection Time: 11/17/20 10:39 PM  Result Value Ref Range   WBC 4.9 4.0 - 10.5 K/uL   RBC 4.77 4.22 - 5.81 MIL/uL   Hemoglobin 12.5 (L) 13.0 - 17.0 g/dL   HCT 34.7 42.5 - 95.6 %   MCV 82.6 80.0 - 100.0 fL   MCH 26.2 26.0 - 34.0 pg   MCHC 31.7 30.0 - 36.0 g/dL   RDW 38.7 56.4 - 33.2 %   Platelets 288 150 - 400 K/uL   nRBC 0.0 0.0 - 0.2 %    Comment: Performed at [facility], [address] [place], [place] [address]    Medications:  Current Facility-Administered Medications  Medication Dose Route Frequency Provider Last Rate Last Admin  . buprenorphine-naloxone (SUBOXONE) 8-2  mg per SL tablet 1 tablet  1 tablet Sublingual Daily Triplett, Tammy, PA-C   1 tablet at 11/19/20 1017  . citalopram (CELEXA) tablet 10 mg  10 mg Oral Daily Aldean Baker, NP      . hydrOXYzine (ATARAX/VISTARIL) tablet 25 mg  25 mg Oral TID PRN Triplett, Tammy, PA-C      . OLANZapine (ZYPREXA) tablet 5 mg  5 mg Oral Daily Triplett, Tammy, PA-C   5 mg at 11/19/20 1017   Current Outpatient Medications  Medication Sig Dispense Refill  . Aspirin-Acetaminophen (GOODYS BODY PAIN PO) Take 1 packet by mouth daily as needed.    . buprenorphine (SUBUTEX) 8 MG SUBL SL tablet Place 8 mg under the tongue daily.    . hydrOXYzine (ATARAX/VISTARIL) 25 MG tablet Take 1 tablet (25 mg total) by mouth 3 (three) times daily as needed for anxiety. 30 tablet 0  . OLANZapine (ZYPREXA) 5 MG tablet Take 5 mg by mouth daily.    . buprenorphine-naloxone (SUBOXONE) 2-0.5 mg SUBL SL tablet Place 1 tablet under the tongue daily. (Patient not taking: Reported on 09/29/2020) 30 tablet   . buprenorphine-naloxone (SUBOXONE) 8-2 mg SUBL SL tablet Place 2 tablets under the tongue daily. (Patient not taking: Reported on 09/29/2020) 30 tablet   . citalopram (CELEXA) 20 MG tablet Take 1 tablet (20 mg total) by mouth daily. (Patient not taking: Reported on 11/17/2020) 30 tablet 0  . traZODone (DESYREL) 50 MG tablet Take 1 tablet (50 mg total) by mouth at bedtime as needed for sleep. (Patient not taking: Reported on 09/29/2020) 30 tablet 0    Psychiatric Specialty Exam: Physical Exam  Review of Systems  Blood pressure 129/66, pulse 72, temperature 98.6 F (37 C), temperature source Oral, resp. rate 16, height 6' (1.829 m), weight 83.9 kg, SpO2 100 %.Body mass index is 25.09 kg/m.  General Appearance: Casual  Eye Contact:  Good  Speech:  Normal Rate  Volume:  Normal  Mood:  Depressed  Affect:  Congruent  Thought Process:  Coherent  Orientation:  Full (Time, Place, and Person)  Thought Content:  Logical  Suicidal Thoughts:  Yes.   with intent/plan  Homicidal Thoughts:  No  Memory:  Immediate;   Fair Recent;   Fair Remote;   Fair  Judgement:  Intact  Insight:  Fair  Psychomotor Activity:  Normal  Concentration:  Concentration: Good and Attention Span: Good  Recall:  [employer] of Knowledge:  Fair  Language:  Good  Akathisia:  No  Handed:  Right  AIMS (if indicated):     Assets:  Communication Skills Desire for Improvement Housing Social Support  ADL's:  Intact  Cognition:  WNL  Sleep:       Disposition: Continue to recommend inpatient psychiatric treatment. Will start Celexa, continue Zyprexa and PRN Vistaril. CSW and ED staff updated.  This service was provided via telemedicine using a 2-way, interactive audio and video technology with the identified patient and this Clinical research associate.  Aldean Baker, NP 11/19/2020 12:40 PM

## 2020-11-19 NOTE — Progress Notes (Signed)
Pt accepted to [facility] Room 304-2 to arrive at 2100.  Marciano Sequin NP is the accepting provider.    Dr. Landry Mellow is the attending provider.    Call report to [phone]    Owens Loffler RN at [facility] ED notified.     Pt is Voluntary.    Pt may be transported by [employer].  Pt scheduled  to arrive at [facility] at  9PM.    Jacinta Shoe, LCSW Clinical Social Worker - Disposition [phone]

## 2020-11-19 NOTE — ED Notes (Signed)
Date and time results received: 11/19/20 1737 (use smartphrase ".now" to insert current time)  Test: Lactic Critical Value: 3.9  Name of Provider Notified: Cortni-PA  Orders Received? Or Actions Taken?:

## 2020-11-20 ENCOUNTER — Encounter (HOSPITAL_COMMUNITY): Payer: Self-pay | Admitting: Psychiatric/Mental Health

## 2020-11-20 ENCOUNTER — Other Ambulatory Visit: Payer: Self-pay

## 2020-11-20 DIAGNOSIS — F332 Major depressive disorder, recurrent severe without psychotic features: Secondary | ICD-10-CM

## 2020-11-20 MED ORDER — POTASSIUM CHLORIDE CRYS ER 20 MEQ PO TBCR
20.0000 meq | EXTENDED_RELEASE_TABLET | Freq: Two times a day (BID) | ORAL | Status: AC
  Administered 2020-11-20 – 2020-11-22 (×4): 20 meq via ORAL
  Filled 2020-11-20 (×4): qty 1

## 2020-11-20 MED ORDER — OLANZAPINE 7.5 MG PO TABS
7.5000 mg | ORAL_TABLET | Freq: Every day | ORAL | Status: DC
  Administered 2020-11-21 – 2020-11-26 (×5): 7.5 mg via ORAL
  Filled 2020-11-20: qty 7
  Filled 2020-11-20 (×8): qty 1

## 2020-11-20 MED ORDER — CITALOPRAM HYDROBROMIDE 20 MG PO TABS
20.0000 mg | ORAL_TABLET | Freq: Every day | ORAL | Status: DC
  Administered 2020-11-21 – 2020-11-27 (×7): 20 mg via ORAL
  Filled 2020-11-20: qty 1
  Filled 2020-11-20: qty 7
  Filled 2020-11-20 (×7): qty 1

## 2020-11-20 NOTE — Progress Notes (Signed)
   11/20/20 2208  Psych Admission Type (Psych Patients Only)  Admission Status Involuntary  Psychosocial Assessment  Patient Complaints Depression  Eye Contact Brief  Facial Expression Flat  Affect Appropriate to circumstance  Speech Logical/coherent  Interaction Assertive  Motor Activity Other (Comment) (WDL)  Appearance/Hygiene In scrubs;Poor hygiene  Behavior Characteristics Appropriate to situation  Mood Depressed  Thought Process  Coherency WDL  Content WDL  Delusions None reported or observed  Perception WDL  Hallucination None reported or observed  Judgment Poor  Confusion None  Danger to Self  Current suicidal ideation? Denies  Danger to Others  Danger to Others None reported or observed

## 2020-11-20 NOTE — [facility] Suicide Risk Assessment (Signed)
[facility] INPATIENT:  Family/Significant Other Suicide Prevention Education  Suicide Prevention Education:  Patient Refusal for Family/Significant Other Suicide Prevention Education: The patient Nathaniel Hansen has refused to provide written consent for family/significant other to be provided Family/Significant Other Suicide Prevention Education during admission and/or prior to discharge.  Physician notified.  CSW completed SPE with patient.  A suicide prevention pamphlet will be placed in the patient's chart.  Metro Kung Keena Heesch 11/20/2020, 2:09 PM

## 2020-11-20 NOTE — Progress Notes (Signed)
   11/20/20 2204  COVID-19 Daily Checkoff  Have you had a fever (temp > 37.80C/100F)  in the past 24 hours?  No  If you have had runny nose, nasal congestion, sneezing in the past 24 hours, has it worsened? No  COVID-19 EXPOSURE  Have you traveled outside the state in the past 14 days? No  Have you been in contact with someone with a confirmed diagnosis of COVID-19 or PUI in the past 14 days without wearing appropriate PPE? No  Have you been living in the same home as a person with confirmed diagnosis of COVID-19 or a PUI (household contact)? No  Have you been diagnosed with COVID-19? No

## 2020-11-20 NOTE — Tx Team (Addendum)
Initial Treatment Plan 11/20/2020 1:21 AM Nathaniel Hansen NTI:[id number]    PATIENT STRESSORS: Financial difficulties Medication change or noncompliance Substance abuse   PATIENT STRENGTHS: Ability for insight Average or above average intelligence Capable of independent living Communication skills Motivation for treatment/growth   PATIENT IDENTIFIED PROBLEMS: Polysubstance abuse  SI with a plan to overdose on his collected pills  depression  anxiety  unemployed             DISCHARGE CRITERIA:  Improved stabilization in mood, thinking, and/or behavior Medical problems require only outpatient monitoring Motivation to continue treatment in a less acute level of care Need for constant or close observation no longer present Verbal commitment to aftercare and medication compliance  PRELIMINARY DISCHARGE PLAN: Attend aftercare/continuing care group Attend PHP/IOP Attend 12-step recovery group Outpatient therapy Return to previous living arrangement  PATIENT/FAMILY INVOLVEMENT: This treatment plan has been presented to and reviewed with the patient, Nathaniel Hansen, and/or family member.  The patient and family have been given the opportunity to ask questions and make suggestions.  Ephraim Hamburger, RN 11/20/2020, 1:21 AM

## 2020-11-20 NOTE — [facility] Group Notes (Signed)
Pt did not attend wrap-up group   

## 2020-11-20 NOTE — Progress Notes (Signed)
Psychoeducational Group Note  Date:  11/20/2020 Time:  0306  Group Topic/Focus:  Wrap-Up Group:   The focus of this group is to help patients review their daily goal of treatment and discuss progress on daily workbooks.  Participation Level: Did Not Attend  Participation Quality:  Not Applicable  Affect:  Not Applicable  Cognitive:  Not Applicable  Insight:  Not Applicable  Engagement in Group: Not Applicable  Additional Comments:  The patient did not attend group since he was admitted to the hallway after the group concluded.   Lerone Onder S 11/20/2020, 3:06 AM

## 2020-11-20 NOTE — [facility] Group Notes (Signed)
[facility] LCSW Group Therapy  11/20/2020 2:37 PM  Type of Therapy:  Coping Skill and Discharge Planning  Participation Level:  Active  Participation Quality:  Appropriate  Affect:  Appropriate  Cognitive:  Appropriate  Insight:  Developing/Improving  Engagement in Therapy:  Developing/Improving  Modes of Intervention:  Activity and Discussion  Summary of Progress/Problems: Nathaniel Hansen came to group late but remained  there the rest of the time.  Nathaniel Hansen shared that one positive thing about his is her likes to play basketball.  Nathaniel Hansen states that he has no support system but that his son is his motivator to do better.  Nathaniel Hansen did accept the handouts that were provided.   Nathaniel Hansen 11/20/2020, 2:37 PM

## 2020-11-20 NOTE — Progress Notes (Signed)
Recreation Therapy Notes  Date:  12.31.21 Time: 0930 Location: [address] Hall Dayroom  Group Topic: Stress Management  Goal Area(s) Addresses:  Patient will identify positive stress management techniques. Patient will identify benefits of using stress management post d/c.  Intervention: Stress Management  Activity:  Meditation.  LRT played a meditation that focused on reflecting on the year that was while preparing for the year to come.  Patients were to follow along as the meditation played and think of the accomplishments they had and even if there were setbacks, find the lesson in it.    Education:  Stress Management, Discharge Planning.   Education Outcome: Acknowledges Education  Clinical Observations/Feedback: Pt did not attend group session.     Caroll Rancher, LRT/CTRS     Lillia Abed, Trenika Hudson A 11/20/2020 10:14 AM

## 2020-11-20 NOTE — BHH Suicide Risk Assessment (Signed)
[facility] Admission Suicide Risk Assessment   Nursing information obtained from:  Patient Demographic factors:  Male,Low socioeconomic status,Unemployed Current Mental Status:  Suicide plan Loss Factors:  Decrease in vocational status,Financial problems / change in socioeconomic status Historical Factors:  Prior suicide attempts Risk Reduction Factors:  Responsible for children under 33 years of age,Living with another person, especially a relative  Total Time spent with patient: 30 minutes Principal Problem: <principal problem not specified> Diagnosis:  Active Problems:   Substance induced mood disorder (HCC)  Subjective Data: Patient is seen and examined.  Patient is a 34 year old male who originally presented to the [facility] emergency department on 11/17/2020 complaining of panic attacks and stating that he felt hopeless.  He had been previously treated with psychiatric medications and needed a refill.  He also told them that he wanted to kill himself and would do a by overdose.  He told the admitting physician that he was residing at a recovery facility in [place] and recently moved in with his mother.  He reported having taken hydroxyzine, Zyprexa.  He stated he ran out of his Zyprexa approximately 1 to 2 weeks prior to admission.  He stated he was also taking Suboxone from a clinic locally.  He reported that he had been in [facility] for somewhere between 3 to 6 months 2 months ago.  He left the program early due to feeling better.  He stated today that he wants to return to a substance rehabilitation program.  He is vague on how long he remained sober after leaving the facility.  He admitted to having used cocaine, pain pills and skipping his Suboxone.  He stated he did well on Zyprexa, Celexa and Vistaril.  In September of this year [facility]  [facility].  He had reported anxiety and paranoia.  His drug  screen was positive for amphetamines, benzodiazepines, cannabinoids and had been placed under involuntary commitment.  The psychiatric service there adjusted his medications to include Zoloft and Zyprexa.  His mood was stabilized.  His involuntary commitment was stopped, and he was provided with outpatient resources for follow-up.  He was referred to [facility] and has an appointment with [facility] on 9/27.  [facility].  Since then he has been living with his mother.  He states he is going to return to her home afterwards, but does want to go to a rehabilitation facility.  His last hospitalization to our system was at [facility] in March of this year.  He was there from 01/27/2020 to 02/03/2020.  His diagnosis at that time was opiate dependence, polysubstance abuse, cocaine abuse and depression.  He was discharged on buprenorphine/naloxone, citalopram, hydroxyzine and trazodone.  He has multiple emergency room visits for medication refills, substance abuse as well as opiate use disorder.  He was admitted to the hospital for evaluation and stabilization.  Continued Clinical Symptoms:  Alcohol Use Disorder Identification Test Final Score (AUDIT): 7 The "Alcohol Use Disorders Identification Test", Guidelines for Use in Primary Care, Second Edition.  World Science writer [facility]). Score between 0-7:  no or low risk or alcohol related problems. Score between 8-15:  moderate risk of alcohol related problems. Score between 16-19:  high risk of alcohol related problems. Score 20 or above:  warrants further diagnostic evaluation for alcohol dependence and treatment.   CLINICAL FACTORS:   Depression:  Anhedonia Comorbid alcohol abuse/dependence Hopelessness Impulsivity Insomnia Alcohol/Substance Abuse/Dependencies Currently Psychotic   Musculoskeletal: Strength & Muscle Tone: within normal limits Gait & Station: normal Patient leans: N/A  Psychiatric  Specialty Exam: Physical Exam Vitals and nursing note reviewed.  HENT:     Head: Normocephalic and atraumatic.  Pulmonary:     Effort: Pulmonary effort is normal.  Neurological:     General: No focal deficit present.     Mental Status: He is alert and oriented to person, place, and time.     Review of Systems  Blood pressure 112/60, pulse 88, temperature 98.4 F (36.9 C), temperature source Oral, resp. rate 18, height 5\' 11"  (1.803 m), weight 79.4 kg, SpO2 100 %.Body mass index is 24.41 kg/m.  General Appearance: Disheveled  Eye Contact:  Fair  Speech:  Normal Rate  Volume:  Decreased  Mood:  Depressed and Dysphoric  Affect:  Congruent  Thought Process:  Coherent and Descriptions of Associations: Intact  Orientation:  Full (Time, Place, and Person)  Thought Content:  Logical  Suicidal Thoughts:  Yes.  without intent/plan  Homicidal Thoughts:  No  Memory:  Immediate;   Poor Recent;   Poor Remote;   Poor  Judgement:  Intact  Insight:  Fair  Psychomotor Activity:  Decreased  Concentration:  Concentration: Fair and Attention Span: Fair  Recall:  of Knowledge:  Fair  Language:  Good  Akathisia:  Negative  Handed:  Right  AIMS (if indicated):     Assets:  Desire for Improvement Resilience  ADL's:  Intact  Cognition:  WNL  Sleep:  Number of Hours: 6.75      COGNITIVE FEATURES THAT CONTRIBUTE TO RISK:  None    SUICIDE RISK:   Mild:  Suicidal ideation of limited frequency, intensity, duration, and specificity.  There are no identifiable plans, no associated intent, mild dysphoria and related symptoms, good self-control (both objective and subjective assessment), few other risk factors, and identifiable protective factors, including available and accessible social support.  PLAN OF CARE: Patient is seen and examined.  Patient is a 34 year old male with the above-stated past psychiatric history who was admitted secondary to anxiety, depression and suicidal  ideation as well as substance abuse.  He will be admitted to the hospital.  He will be integrated in the milieu.  He will be encouraged to attend groups.  We have already restarted his citalopram and Zyprexa.  The Zyprexa is 5 mg p.o. nightly, citalopram is 10 mg p.o. daily.  We will continue the Suboxone 8/2 mg sublingual daily.  We will contact the outpatient clinic to confirm that they are prescribing that for him.  He will also have available hydroxyzine for anxiety as well as trazodone for sleep.  Review of his admission laboratories showed a low potassium at 2.9.  That will be supplemented.  He has a reported history of stage III renal disease, but his creatinine and GFR are currently normal.  He denied any history of hepatitis.  His CBC was normal.  Acetaminophen was less than 10, salicylate less than 7.  Blood alcohol was less than 10.  Drug screen was positive for amphetamines and marijuana.  Negative for opiates.  EKG is not been obtained.  His vital signs are stable, he is afebrile.  He does not show any evidence of acute withdrawal at least at this point.  I certify that inpatient services furnished can reasonably be expected to improve the patient's condition.   32,  MD 11/20/2020, 11:55 AM

## 2020-11-20 NOTE — Progress Notes (Signed)
Pt is a 34 y.o. male presenting to [facility] from [facility] for SI with a plan to overdose on collected heroin. Pt reports stress, depression, and anxiety. Pt said that he had a bad panic attack before he was about to attempt to overdose which his Mom had noticed and then called EMS. He said that he hasn't been sleeping well which has caused him to have some hallucinations. Pt said that sometimes he'll see something in the corner of his eye or he'll swear that he saw something. Pt denies any past suicide attempts, but did say that he felt suicidal a few months ago as well. He said the problem is that he doesn't understand what is making him feel depressed and wants to be able to identify his triggers. He is unemployed. Pt does have a hx of polysubstance abuse. UDS is positive for amphetamines and THC. Pt reports use of cocaine and heroin for dealing with his depression. Last use was 1 week ago where he used 0.5 gram. He said his frequency is every couple of days. Alcohol use was last 6 months ago, he has 1 beer once a month. He said that he did well when he was at a recovery home in [place]. He is currently staying in an apartment with his mother. He said that he was last hospitalized 2 weeks ago at [facility] for the same thing. He was taking vistaril and Zyprexa at home until a couple of weeks ago he ran out of his Zyprexa. He identifies no support person. He denies any previous hx of cutting. His goals are to "try to get back on a good mental path" and to work on his "substance abuse."   Pt denies SI/HI and AVH at the time of assessment. Pt verbally contracts for safety. He agrees to notify staff immediately for any thoughts of hurting himself or anyone else. Unit rules/policies discussed. Consents discussed with pt and signed. Informed pt about items allowed on the unit and contraband. Belongings search completed and items as necessary have been secured in assigned locker. Skin assessment completed. Food/fluids offered and  accepted. Unit tour provided. Opportunity to ask questions provided. Q 15 min safety checks initiated. Pt's safety has been maintained.

## 2020-11-20 NOTE — Tx Team (Signed)
Interdisciplinary Treatment and Diagnostic Plan Update  11/20/2020 Time of Session: 9:30am  Nathaniel Hansen MRN: [id number]  Principal Diagnosis: <principal problem not specified>  Secondary Diagnoses: Active Problems:   Substance induced mood disorder (HCC)   Current Medications:  Current Facility-Administered Medications  Medication Dose Route Frequency Provider Last Rate Last Admin  . acetaminophen (TYLENOL) tablet 650 mg  650 mg Oral Q6H PRN Connye Burkitt, NP      . alum & mag hydroxide-simeth (MAALOX/MYLANTA) 200-200-20 MG/5ML suspension 30 mL  30 mL Oral Q4H PRN Connye Burkitt, NP      . buprenorphine-naloxone (SUBOXONE) 8-2 mg per SL tablet 1 tablet  1 tablet Sublingual Daily Connye Burkitt, NP   1 tablet at 11/20/20 0813  . citalopram (CELEXA) tablet 10 mg  10 mg Oral Daily Connye Burkitt, NP   10 mg at 11/20/20 0813  . hydrOXYzine (ATARAX/VISTARIL) tablet 25 mg  25 mg Oral Q6H PRN Connye Burkitt, NP   25 mg at 11/20/20 0813  . magnesium hydroxide (MILK OF MAGNESIA) suspension 30 mL  30 mL Oral Daily PRN Connye Burkitt, NP      . OLANZapine (ZYPREXA) tablet 5 mg  5 mg Oral QHS Connye Burkitt, NP      . traZODone (DESYREL) tablet 50 mg  50 mg Oral QHS PRN Connye Burkitt, NP   50 mg at 11/19/20 2233   PTA Medications: Medications Prior to Admission  Medication Sig Dispense Refill Last Dose  . Aspirin-Acetaminophen (GOODYS BODY PAIN PO) Take 1 packet by mouth daily as needed.     . buprenorphine (SUBUTEX) 8 MG SUBL SL tablet Place 8 mg under the tongue daily.     . buprenorphine-naloxone (SUBOXONE) 2-0.5 mg SUBL SL tablet Place 1 tablet under the tongue daily. (Patient not taking: Reported on 09/29/2020) 30 tablet    . buprenorphine-naloxone (SUBOXONE) 8-2 mg SUBL SL tablet Place 2 tablets under the tongue daily. (Patient not taking: Reported on 09/29/2020) 30 tablet    . citalopram (CELEXA) 20 MG tablet Take 1 tablet (20 mg total) by mouth daily. (Patient not taking: Reported  on 11/17/2020) 30 tablet 0   . hydrOXYzine (ATARAX/VISTARIL) 25 MG tablet Take 1 tablet (25 mg total) by mouth 3 (three) times daily as needed for anxiety. 30 tablet 0   . OLANZapine (ZYPREXA) 5 MG tablet Take 5 mg by mouth daily.     . traZODone (DESYREL) 50 MG tablet Take 1 tablet (50 mg total) by mouth at bedtime as needed for sleep. (Patient not taking: Reported on 09/29/2020) 30 tablet 0     Patient Stressors: Financial difficulties Medication change or noncompliance Substance abuse  Patient Strengths: Ability for insight Average or above average intelligence Capable of independent living Agricultural engineer for treatment/growth  Treatment Modalities: Medication Management, Group therapy, Case management,  1 to 1 session with clinician, Psychoeducation, Recreational therapy.   Physician Treatment Plan for Primary Diagnosis: <principal problem not specified> Long Term Goal(s):     Short Term Goals:    Medication Management: Evaluate patient's response, side effects, and tolerance of medication regimen.  Therapeutic Interventions: 1 to 1 sessions, Unit Group sessions and Medication administration.  Evaluation of Outcomes: Not Met  Physician Treatment Plan for Secondary Diagnosis: Active Problems:   Substance induced mood disorder ([place])  Long Term Goal(s):     Short Term Goals:       Medication Management: Evaluate patient's response, side effects, and tolerance of  medication regimen.  Therapeutic Interventions: 1 to 1 sessions, Unit Group sessions and Medication administration.  Evaluation of Outcomes: Not Met   RN Treatment Plan for Primary Diagnosis: <principal problem not specified> Long Term Goal(s): Knowledge of disease and therapeutic regimen to maintain health will improve  Short Term Goals: Ability to remain free from injury will improve, Ability to participate in decision making will improve, Ability to verbalize feelings will improve, Ability to  disclose and discuss suicidal ideas and Ability to identify and develop effective coping behaviors will improve  Medication Management: RN will administer medications as ordered by provider, will assess and evaluate patient's response and provide education to patient for prescribed medication. RN will report any adverse and/or side effects to prescribing provider.  Therapeutic Interventions: 1 on 1 counseling sessions, Psychoeducation, Medication administration, Evaluate responses to treatment, Monitor vital signs and CBGs as ordered, Perform/monitor CIWA, COWS, AIMS and Fall Risk screenings as ordered, Perform wound care treatments as ordered.  Evaluation of Outcomes: Not Met   LCSW Treatment Plan for Primary Diagnosis: <principal problem not specified> Long Term Goal(s): Safe transition to appropriate next level of care at discharge, Engage patient in therapeutic group addressing interpersonal concerns.  Short Term Goals: Engage patient in aftercare planning with referrals and resources, Increase social support, Increase emotional regulation, Facilitate acceptance of mental health diagnosis and concerns, Identify triggers associated with mental health/substance abuse issues and Increase skills for wellness and recovery  Therapeutic Interventions: Assess for all discharge needs, 1 to 1 time with Social worker, Explore available resources and support systems, Assess for adequacy in community support network, Educate family and significant other(s) on suicide prevention, Complete Psychosocial Assessment, Interpersonal group therapy.  Evaluation of Outcomes: Not Met   Progress in Treatment: Attending groups: No. Participating in groups: No. Taking medication as prescribed: Yes. Toleration medication: Yes. Family/Significant other contact made: No, will contact:  If consents are given Patient understands diagnosis: Yes. and No. Discussing patient identified problems/goals with staff:  Yes. Medical problems stabilized or resolved: Yes. Denies suicidal/homicidal ideation: Yes. Issues/concerns per patient self-inventory: No.   New problem(s) identified: No, Describe:  None   New Short Term/Long Term Goal(s): medication stabilization, elimination of SI thoughts, development of comprehensive mental wellness plan.   Patient Goals:  "To manage my anxiety and depression better"  Discharge Plan or Barriers: Patient recently admitted. CSW will continue to follow and assess for appropriate referrals and possible discharge planning.   Reason for Continuation of Hospitalization: Anxiety Depression Medication stabilization Suicidal ideation Withdrawal symptoms  Estimated Length of Stay: 3 to 5 days   Attendees: Patient: Nathaniel Hansen 11/20/2020   Physician: Myles Lipps, MD 11/20/2020   Nursing:  11/20/2020   RN Care Manager: 11/20/2020   Social Worker: Verdis Frederickson, [place] 11/20/2020   Recreational Therapist:  11/20/2020   Other:  11/20/2020   Other:  11/20/2020   Other: 11/20/2020     Scribe for Treatment Team: Darleen Crocker, [place] 11/20/2020 11:26 AM

## 2020-11-20 NOTE — BHH Counselor (Signed)
Adult Comprehensive Assessment  Patient ID: Nathaniel Hansen, male   DOB: September 03, 1986, 34 y.o.   MRN: [id number]   Information Source: Information source: Patient  Current Stressors:  Patient states their primary concerns and needs for treatment are:: Pt reports "anxiety, depression, suicidal thoughts and I had a panic attack." Patient states their goals for this hospitilization and ongoing recovery are:: Pt reports "get into a substance use treatment facility like [facility] / Learning stressors: Pt reports only having an 8th grade education. Employment / Job issues: Pt is currently unemployed. Family Relationships: Pt reports that the stress in the family is centered around mom. Financial / Lack of resources (include bankruptcy): Pt reports "no job, no money". Housing / Lack of housing: Pt denies Physical health (include injuries & life threatening diseases): Pt denies. Social relationships: Pt denies. Substance abuse: Pt reports heroin and meth use. Bereavement / Loss: Pt denies.  Living/Environment/Situation:  Living Arrangements: Parents, Other relatives Who else lives in the home?: Pt reports that he lives with his parents and brother. How long has patient lived in current situation?: Pt reports 3 months. What is atmosphere in current home: "I don't like it."  Family History:  Marital status: Single Are you sexually active?: Yes What is your sexual orientation?: Heterosexual Has your sexual activity been affected by drugs, alcohol, medication, or emotional stress?: Pt denies. Does patient have children?: Yes How many children?: 1 How is patient's relationship with their children?: Pt denies.  Childhood History:  By whom was/is the patient raised?: Both parents Description of patient's relationship with caregiver when they were a child: Pt reports "good". Patient's description of current relationship with people who raised him/her: Pt reports  "not so good". How were you disciplined when you got in trouble as a child/adolescent?: Pt reports "grounded or whooped". Does patient have siblings?: Yes Number of Siblings: 2 Description of patient's current relationship with siblings: Pt reports "not so good with my brother, used to be better with my sister". Did patient suffer any verbal/emotional/physical/sexual abuse as a child?: No Did patient suffer from severe childhood neglect?: No Has patient ever been sexually abused/assaulted/raped as an adolescent or adult?: No Was the patient ever a victim of a crime or a disaster?: No Witnessed domestic violence?: No Has patient been effected by domestic violence as an adult?: No  Education:  Highest grade of school patient has completed: 8th grade Currently a student?: No Learning disability?: No  Employment/Work Situation:   Employment situation: Unemployed What is the longest time patient has a held a job?: 2 years Where was the patient employed at that time?: [employer] Did You Receive Any Psychiatric Treatment/Services While in Equities trader?: No(NA) Are There Guns or Other Weapons in Your Home?: No  Financial Resources:   Surveyor, quantity resources: Support from parents / caregiver Does patient have a Lawyer or guardian?: No  Alcohol/Substance Abuse:   What has been your use of drugs/alcohol within the last 12 months?: Heroin and meth: "couple times a week"; If attempted suicide, did drugs/alcohol play a role in this?: No Alcohol/Substance Abuse Treatment Hx: Yes, pt reports inpatient, outpatient and AA/NA treatment in the past.  Has alcohol/substance abuse ever caused legal problems?: No  Social Support System:   Patient's Community Support System: Poor Describe Community Support System: Pt reports "I don't have one". Type of faith/religion: Pt reports "I'm spiritual." How does patient's faith help to cope with current illness?: Pt reports "I am spiritual, I  believe  in the [employer]."  Leisure/Recreation:   Leisure and Hobbies: Pt reports "fishing & basketball".  Strengths/Needs:   What is the patient's perception of their strengths?: Pt reports "playing basketball." Patient states these barriers may affect/interfere with their treatment: Pt denies. Patient states these barriers may affect their return to the community: Pt denies.  Discharge Plan:   Currently receiving community mental health services: No Patient states concerns and preferences for aftercare planning are: Pt reports that he is seeking referrals for residential. Patient states they will know when they are safe and ready for discharge when: Pt reports he wants therapy and medication management. Does patient have access to transportation?: No Does patient have financial barriers related to discharge medications?: No Patient description of barriers related to discharge medications: Pt does not have insurance. Plan for no access to transportation at discharge: CSW will assist with transportation. Will patient be returning to same living situation after discharge?: Yes  Summary/Recommendations:   Summary and Recommendations (to be completed by the evaluator): Patient is a 34 year old single male [facility] [place] [place] [facility]  He presents to the hospital following increase in thoughts of suicide.  He has a primary diagnosis of Polysubtance Abuse.   Recommendations include: crisis stabilization, therapeutic milieu, encourage group attendance and participation, medication management for detox/mood stabilization and development of comprehensive mental wellness/sobriety plan.      [name]. 11/20/2020

## 2020-11-20 NOTE — Progress Notes (Signed)
Pt denied SI/HI/AVH.  Pt was calm and cooperative this shift.  Pt took medications without incident. RN assessed for needs and concerns and provided support.  Pt remains safe with q 15 min checks in place.

## 2020-11-21 MED ORDER — NICOTINE POLACRILEX 2 MG MT GUM: 2.0000 mg | CHEWING_GUM | OROMUCOSAL | Status: DC | PRN

## 2020-11-21 MED ORDER — NICOTINE POLACRILEX 2 MG MT GUM
CHEWING_GUM | OROMUCOSAL | Status: AC
  Administered 2020-11-21: 2 mg via ORAL
  Filled 2020-11-21: qty 1

## 2020-11-21 NOTE — Progress Notes (Signed)
   11/21/20 2015  COVID-19 Daily Checkoff  Have you had a fever (temp > 37.80C/100F)  in the past 24 hours?  No  If you have had runny nose, nasal congestion, sneezing in the past 24 hours, has it worsened? No  COVID-19 EXPOSURE  Have you been in contact with someone with a confirmed diagnosis of COVID-19 or PUI in the past 14 days without wearing appropriate PPE? No  Have you been living in the same home as a person with confirmed diagnosis of COVID-19 or a PUI (household contact)? No  Have you been diagnosed with COVID-19? No

## 2020-11-21 NOTE — Progress Notes (Signed)
[facility] NOVEL CORONAVIRUS (COVID-19) DAILY CHECK-OFF SYMPTOMS - answer yes or no to each - every day NO YES  Have you had a fever in the past 24 hours?  . Fever (Temp > 37.80C / 100F) X   Have you had any of these symptoms in the past 24 hours? . New Cough .  Sore Throat  .  Shortness of Breath .  Difficulty Breathing .  Unexplained Body Aches   X   Have you had any one of these symptoms in the past 24 hours not related to allergies?   . Runny Nose .  Nasal Congestion .  Sneezing   X   If you have had runny nose, nasal congestion, sneezing in the past 24 hours, has it worsened?  X   EXPOSURES - check yes or no X   Have you traveled outside the state in the past 14 days?  X   Have you been in contact with someone with a confirmed diagnosis of COVID-19 or PUI in the past 14 days without wearing appropriate PPE?  X   Have you been living in the same home as a person with confirmed diagnosis of COVID-19 or a PUI (household contact)?    X   Have you been diagnosed with COVID-19?    X              What to do next: Answered NO to all: Answered YES to anything:   Proceed with unit schedule Follow the [facility] Inpatient Flowsheet.   

## 2020-11-21 NOTE — [facility] Group Notes (Signed)
Adult Psychoeducational Group Note  Date:  11/21/2020 Time:  10:37 AM  Group Topic/Focus:  Orientation:   The focus of this group is to educate the patient on the purpose and policies of crisis stabilization and provide a format to answer questions about their admission.  The group details unit policies and expectations of patients while admitted.  Participation Level:  Active  Participation Quality:  Appropriate  Affect:  Appropriate  Cognitive:  Appropriate  Insight: Appropriate  Engagement in Group:  Engaged  Modes of Intervention:  Discussion  Additional Comments:  Patient attended morning Orientation and goal setting group and said that his goal for today is to attend groups and work towards being discharge.   Mollyann Halbert W Demetris Capell 11/21/2020, 10:37 AM

## 2020-11-21 NOTE — Progress Notes (Signed)
   11/21/20 1300  Psych Admission Type (Psych Patients Only)  Admission Status Involuntary  Psychosocial Assessment  Patient Complaints Depression  Eye Contact Brief  Facial Expression Flat  Affect Appropriate to circumstance  Speech Logical/coherent  Interaction Assertive  Motor Activity Other (Comment) (WDL)  Appearance/Hygiene In scrubs;Poor hygiene  Behavior Characteristics Cooperative;Appropriate to situation  Mood Depressed  Thought Process  Coherency WDL  Content WDL  Delusions None reported or observed  Perception WDL  Hallucination None reported or observed  Judgment Poor  Confusion None  Danger to Self  Current suicidal ideation? Denies  Danger to Others  Danger to Others None reported or observed

## 2020-11-21 NOTE — [facility] Group Notes (Signed)
  [facility] LCSW Group Therapy Note  Date/Time:  11/21/2020 10:00AM-11:00AM  Type of Therapy and Topic:  Group Therapy:  Self-Care Wheel  Participation Level:  Active   Description of Group This process group involved patients discussing the importance of self-care in different areas of life (professional, personal, emotional, psychological, spiritual, and physical) in order to achieve healthy life balance.  The group talked about what self-care in each of those areas would constitute and then specifically listed how they want to provide themselves with improved self-care in this new year.      Therapeutic Goals 1. Patient will learn how to break self-care down into various areas of life 2. Patient will participate in generating ideas about healthy self-care options in each category 3. Patients will be supportive of one another and receive support from others 4. Patient will identify one healthy self-care activity to add to his/her life this year  Summary of Patient Progress:  The patient expressed that one goal he/she has this year is to try to get his GED.  He was able to identify ways in which self-care in different areas would help to reach that goal.  Patient's participation in group was good and he remained on task at all times, was supportive of others.     Therapeutic Modalities Processing Psychoeducation   Ambrose Mantle, LCSW 11/21/2020 3:22 PM

## 2020-11-21 NOTE — Progress Notes (Signed)
[facility] MD Progress Note  11/21/2020 1:30 PM Nathaniel Hansen  MRN:  [id number]  Subjective: Nathaniel Hansen reports, "I was having bad panic attacks because of the situations going on at home & me being off my Celexa for a long time. That made feel like losing hope. I'm still feeling down & depressed today. But I have started back on my Celexa, it has not kicked in yet. But, I'm hopeful it help me this time like it in the past. I slept pretty good last night. I have been attending group sessions. I have not had the panic attacks since being in this hospital".  Objective: Nathaniel Hansen is a 35 year old male who originally presented to the [facility] emergency department on 11/17/2020 complaining of panic attacks and stating that he felt hopeless.  He had been previously treated with psychiatric medications and needed a refill.  He also told them that he wanted to kill himself and would do a by overdose. Daily notes: Nathaniel Hansen is seen in his room. Chart reviewed. The chart findings discussed with the treatment team. He presents alert & oriented x 3. He is making good eye contact. He says he came to the hospital because he was feeling depressed & hopeless. He blamed his symptoms on the situations he found himself in at his home. Says his situation is kind of hard to explain. He adds what made matters worse for him was that he has not been on his Celexa for several months. He says he is hopeful that now that he is back on his medications, he will start to feel better again. He is currently taking & tolerating his treatment regimen, denies any side effects. He denies any SIHI, AVH, delusional thoughts or paranoia. He does not appear to be responding to any internal stimuli. Nathaniel Hansen is in agreement to continue his current plan of care as already in progress.  Principal Problem: Severe recurrent major depression without psychotic features (HCC)  Diagnosis: Principal Problem:   Severe recurrent major depression without psychotic  features (HCC) Active Problems:   Substance induced mood disorder (HCC)  Total Time spent with patient: 25 minutes  Past Psychiatric History: Past history of longstanding opiate use and other IV drug use.  Recurrent depression.  Past Medical History:  Past Medical History:  Diagnosis Date  . Anxiety   . Asthma   . Bipolar disorder (HCC)   . Cocaine abuse (HCC)   . Depression   . Endocarditis   . Opioid abuse [facility])     Past Surgical History:  Procedure Laterality Date  . NO PAST SURGERIES     Family History: History reviewed. No pertinent family history.  Family Psychiatric  History: See H&P  Social History:  Social History   Substance and Sexual Activity  Alcohol Use Yes   Comment: last use was 6 months ago, drinks 1 beer once a month     Social History   Substance and Sexual Activity  Drug Use Yes  . Types: Marijuana, Heroin, Cocaine   Comment: lat use of occaine & heroine was 1 week ago of 0.5 grams, uses "every couple of days"    Social History   Socioeconomic History  . Marital status: Single    Spouse name: Not on file  . Number of children: Not on file  . Years of education: Not on file  . Highest education level: Not on file  Occupational History  . Not on file  Tobacco Use  . Smoking status: Current Every Day  Smoker    Packs/day: 1.00    Types: Cigarettes  . Smokeless tobacco: Never Used  Vaping Use  . Vaping Use: Never used  Substance and Sexual Activity  . Alcohol use: Yes    Comment: last use was 6 months ago, drinks 1 beer once a month  . Drug use: Yes    Types: Marijuana, Heroin, Cocaine    Comment: lat use of occaine & heroine was 1 week ago of 0.5 grams, uses "every couple of days"  . Sexual activity: Yes  Other Topics Concern  . Not on file  Social History Narrative  . Not on file   Social Determinants of Health   Financial Resource Strain: Not on file  Food Insecurity: Not on file  Transportation Needs: Not on file  Physical  Activity: Not on file  Stress: Not on file  Social Connections: Not on file   Additional Social History:   Sleep: Good  Appetite:  Fair  Current Medications: Current Facility-Administered Medications  Medication Dose Route Frequency Provider Last Rate Last Admin  . acetaminophen (TYLENOL) tablet 650 mg  650 mg Oral Q6H PRN Aldean Baker, NP      . alum & mag hydroxide-simeth (MAALOX/MYLANTA) 200-200-20 MG/5ML suspension 30 mL  30 mL Oral Q4H PRN Aldean Baker, NP      . buprenorphine-naloxone (SUBOXONE) 8-2 mg per SL tablet 1 tablet  1 tablet Sublingual Daily Aldean Baker, NP   1 tablet at 11/21/20 6301  . citalopram (CELEXA) tablet 20 mg  20 mg Oral Daily Antonieta Pert, MD   20 mg at 11/21/20 6010  . hydrOXYzine (ATARAX/VISTARIL) tablet 25 mg  25 mg Oral Q6H PRN Aldean Baker, NP   25 mg at 11/20/20 1613  . magnesium hydroxide (MILK OF MAGNESIA) suspension 30 mL  30 mL Oral Daily PRN Aldean Baker, NP      . OLANZapine (ZYPREXA) tablet 7.5 mg  7.5 mg Oral QHS Antonieta Pert, MD      . potassium chloride SA (KLOR-CON) CR tablet 20 mEq  20 mEq Oral BID Antonieta Pert, MD   20 mEq at 11/21/20 9323  . traZODone (DESYREL) tablet 50 mg  50 mg Oral QHS PRN Aldean Baker, NP   50 mg at 11/19/20 2233   Lab Results:  Results for orders placed or performed during the hospital encounter of 11/17/20 (from the past 48 hour(s))  Resp Panel by RT-PCR (Flu A&B, Covid) Nasopharyngeal Swab     Status: None   Collection Time: 11/19/20  6:59 PM   Specimen: Nasopharyngeal Swab; Nasopharyngeal(NP) swabs in vial transport medium  Result Value Ref Range   SARS Coronavirus 2 by RT PCR NEGATIVE NEGATIVE    Comment: (NOTE) SARS-CoV-2 target nucleic acids are NOT DETECTED.  The SARS-CoV-2 RNA is generally detectable in upper respiratory specimens during the acute phase of infection. The lowest concentration of SARS-CoV-2 viral copies this assay can detect is 138 copies/mL. A negative  result does not preclude SARS-Cov-2 infection and should not be used as the sole basis for treatment or other patient management decisions. A negative result may occur with  improper specimen collection/handling, submission of specimen other than nasopharyngeal swab, presence of viral mutation(s) within the areas targeted by this assay, and inadequate number of viral copies(<138 copies/mL). A negative result must be combined with clinical observations, patient history, and epidemiological information. The expected result is Negative.  Fact Sheet for Patients:  BloggerCourse.com  Fact  Sheet for Healthcare Providers:  SeriousBroker.it  This test is no t yet approved or cleared by the [place] FDA and  has been authorized for detection and/or diagnosis of SARS-CoV-2 by FDA under an Emergency Use Authorization (EUA). This EUA will remain  in effect (meaning this test can be used) for the duration of the COVID-19 declaration under Section 564(b)(1) of the Act, 21 U.S.C.section 360bbb-3(b)(1), unless the authorization is terminated  or revoked sooner.       Influenza A by PCR NEGATIVE NEGATIVE   Influenza B by PCR NEGATIVE NEGATIVE    Comment: (NOTE) The Xpert Xpress SARS-CoV-2/FLU/RSV plus assay is intended as an aid in the diagnosis of influenza from Nasopharyngeal swab specimens and should not be used as a sole basis for treatment. Nasal washings and aspirates are unacceptable for Xpert Xpress SARS-CoV-2/FLU/RSV testing.  Fact Sheet for Patients: BloggerCourse.com  Fact Sheet for Healthcare Providers: SeriousBroker.it  This test is not yet approved or cleared by the [place] FDA and has been authorized for detection and/or diagnosis of SARS-CoV-2 by FDA under an Emergency Use Authorization (EUA). This EUA will remain in effect (meaning this test can be used) for the  duration of the COVID-19 declaration under Section 564(b)(1) of the Act, 21 U.S.C. section 360bbb-3(b)(1), unless the authorization is terminated or revoked.  Performed at [facility], [address] [place], [place] [address]     Blood Alcohol level:  Lab Results  Component Value Date   [facility] <10 11/17/2020   ETH <10 01/25/2020   Metabolic Disorder Labs: No results found for: HGBA1C, MPG No results found for: PROLACTIN No results found for: CHOL, TRIG, HDL, CHOLHDL, VLDL, LDLCALC  Physical Findings: AIMS: Facial and Oral Movements Muscles of Facial Expression: None, normal Lips and Perioral Area: None, normal Jaw: None, normal Tongue: None, normal,Extremity Movements Upper (arms, wrists, hands, fingers): None, normal Lower (legs, knees, ankles, toes): None, normal, Trunk Movements Neck, shoulders, hips: None, normal, Overall Severity Severity of abnormal movements (highest score from questions above): None, normal Incapacitation due to abnormal movements: None, normal Patient's awareness of abnormal movements (rate only patient's report): No Awareness, Dental Status Current problems with teeth and/or dentures?: No Does patient usually wear dentures?: No  CIWA:  CIWA-Ar Total: 0 COWS:  COWS Total Score: 1  Musculoskeletal: Strength & Muscle Tone: within normal limits Gait & Station: normal Patient leans: N/A  Psychiatric Specialty Exam: Physical Exam Vitals and nursing note reviewed.  Constitutional:      Appearance: He is well-developed.  HENT:     Head: Normocephalic and atraumatic.     Mouth/Throat:     Pharynx: Oropharynx is clear.  Eyes:     Conjunctiva/sclera: Conjunctivae normal.     Pupils: Pupils are equal, round, and reactive to light.  Cardiovascular:     Rate and Rhythm: Regular rhythm.     Heart sounds: Normal heart sounds.  Pulmonary:     Effort: Pulmonary effort is normal. No respiratory distress.  Abdominal:     Palpations: Abdomen is soft.   Genitourinary:    Comments: Deferred Musculoskeletal:        General: Normal range of motion.     Cervical back: Normal range of motion.  Skin:    General: Skin is warm and dry.  Neurological:     General: No focal deficit present.     Mental Status: He is alert and oriented to person, place, and time.  Psychiatric:  Speech: Speech normal.        Behavior: Behavior normal.        Thought Content: Thought content normal.        Judgment: Judgment normal.     Review of Systems  Constitutional: Negative.  Negative for chills and diaphoresis.  HENT: Negative.  Negative for rhinorrhea, sneezing and sore throat.   Eyes: Negative.   Respiratory: Negative.  Negative for cough, shortness of breath and wheezing.   Cardiovascular: Negative.  Negative for chest pain and palpitations.  Gastrointestinal: Negative.  Negative for diarrhea, nausea and vomiting.  Endocrine: Negative for cold intolerance.  Genitourinary: Negative for difficulty urinating.  Musculoskeletal: Negative.   Skin: Negative.   Allergic/Immunologic:       Allergies: NKDA  Neurological: Negative.  Negative for dizziness, tremors, seizures, syncope, facial asymmetry, speech difficulty, weakness, light-headedness, numbness and headaches.  Psychiatric/Behavioral: Positive for agitation and dysphoric mood. Negative for behavioral problems, confusion, decreased concentration, hallucinations, self-injury, sleep disturbance and suicidal ideas. The patient is nervous/anxious. The patient is not hyperactive.     Blood pressure (P) 113/90, pulse (P) 65, temperature 98.3 F (36.8 C), temperature source Oral, resp. rate 18, height 5\' 11"  (1.803 m), weight 79.4 kg, SpO2 100 %.Body mass index is 24.41 kg/m.  General Appearance: Casual  Eye Contact:  Fair  Speech:  Clear and Coherent  Volume:  Normal  Mood:  Euthymic  Affect:  Congruent  Thought Process:  Goal Directed  Orientation:  Full (Time, Place, and Person)  Thought  Content:  Logical  Suicidal Thoughts:  No  Homicidal Thoughts:  No  Memory:  Immediate;   Fair Recent;   Fair Remote;   Fair  Judgement:  Fair  Insight:  Fair  Psychomotor Activity:  Normal  Concentration:  Concentration: Fair  Recall:  [employer] of Knowledge:  Fair  Language:  Fair  Akathisia:  No  Handed:  Right  AIMS (if indicated):     Assets:  Communication Skills Desire for Improvement Physical Health Resilience Social Support  ADL's:  Intact  Cognition:  WNL  Sleep:  Number of Hours: 7   Treatment Plan Summary: Daily contact with patient to assess and evaluate symptoms and progress in treatment and Medication management.   Continue inpatient hospitalization. Will continue today 11/21/2020 plan as below except where it is noted.  Mood control. Continue Olanzapine 7.5 mg po Q hs.  Depression.  Continue Citalopram 20 mg po daily.  Anxiety. Continue Vistaril 25 mg po tid prn.  Insomnia. Continue Trazodone 50 mg po Q hs prn.  Opioid addiction. Continue SUBOXONE 8-2 mg sublingually daily.  Other medications. Continue Tylenol 650 mg po Q 6 hrs prn for pain/fever. Continue Mylanta 30 ml 90 Q 4 hrs prn for indigestion. Continue MOM 30 ml po daily prn for constipation. Continue Kdur 20 meq po bid x 1 more day.  Encourage group participation. Discharge disposition plan is in progress.  Nathaniel Spar, NP, PMHNP, FNP-BC 11/21/2020, 1:30 PMPatient ID: Nathaniel Hansen, male   DOB: November 07, 1986, 35 y.o.   MRN: [id number]

## 2020-11-21 NOTE — [facility] Group Notes (Signed)
.  Psychoeducational Group Note  Date: 11-21-20 Time: 0900-1000    Goal Setting   Purpose of Group: This group helps to provide patients with the steps of setting a goal that is specific, measurable, attainable, realistic and time specific. A discussion on how we keep ourselves stuck with negative self talk.    Participation Level:  Active  Participation Quality:  Appropriate  Affect:  Appropriate  Cognitive:  Appropriate  Insight:  Improving  Engagement in Group:  Engaged  Additional Comments:  Pt rates his energy at a 5/10. States his goal today is to write out a list of wht he would like to accomplish.  Dione Housekeeper

## 2020-11-21 NOTE — Progress Notes (Signed)
Patient has been up and in the dayroom watching tv with peers and minimal interaction He attended group this evening and has voiced no complaints. Patient currently denies having pain, -si/hi/a/v hall. Support and encouragement offered, safety maintained on unit, will continue to monitor.

## 2020-11-21 NOTE — [facility] Group Notes (Signed)
.  Psychoeducational Group Note    Date:11/21/2020 Time: 1300-1400    Life Skills:  A group where two lists are made. What people need and what are things that we do that are healthy. The lists are developed by the patients and it is explained that we often do the actions that are not healthy to get our list of needs met.   Purpose of Group: . The group focus' on teaching patients on how to identify their needs and how to develop the coping skills needed to get their needs met  Participation Level:  Did not attend   Paulino Rily

## 2020-11-21 NOTE — H&P (Signed)
Psychiatric Admission Assessment Adult  Patient Identification: Nathaniel Hansen  MRN:  [id number]  Date of Evaluation:  11/21/2020  Chief Complaint: Panic attacks & feeling of hopelessness.  Principal Diagnosis: Severe recurrent major depression without psychotic features ([place])  Diagnosis:  Principal Problem:   Severe recurrent major depression without psychotic features ([place]) Active Problems:   Substance induced mood disorder ([place])  History of Present Illness: Patient is seen and examined.  Patient is a 35 year old male who originally presented to the [facility] emergency department on 11/17/2020 complaining of panic attacks and stating that he felt hopeless.  He had been previously treated with psychiatric medications and needed a refill.  He also told them that he wanted to kill himself and would do a by overdose.  He told the admitting physician that he was residing at a recovery facility in [place] and recently moved in with his mother.  He reported having taken hydroxyzine, Zyprexa.  He stated he ran out of his Zyprexa approximately 1 to 2 weeks prior to admission.  He stated he was also taking Suboxone from a clinic locally.  He reported that he had been in [place] for somewhere between 3 to 6 months 2 months ago.  He left the program early due to feeling better.  He stated today that he wants to return to a substance rehabilitation program.  He is vague on how long he remained sober after leaving the facility.  He admitted to having used cocaine, pain pills and skipping his Suboxone.  He stated he did well on Zyprexa, Celexa and Vistaril.  [facility] of this year [facility]  He had come there from [place] [facility].  He had reported anxiety and paranoia.  His drug screen was positive for amphetamines, benzodiazepines, cannabinoids and had been placed under involuntary commitment.  The psychiatric service there adjusted his  medications to include Zoloft and Zyprexa.  His mood was stabilized.  His involuntary commitment was stopped, and he was provided with outpatient resources for follow-up.  He was referred to [facility] and has an appointment with [facility] on [facility].  [facility].  Since then he has been living with his mother.  He states he is going to return to her home afterwards, but does want to go to a rehabilitation facility.  His last hospitalization to our system was at [facility] in March of this year.  He was there from 01/27/2020 to 02/03/2020.  His diagnosis at that time was opiate dependence, polysubstance abuse, cocaine abuse and depression.  He was discharged on buprenorphine/naloxone, citalopram, hydroxyzine and trazodone.  He has multiple emergency room visits for medication refills, substance abuse as well as opiate use disorder.  He was admitted to the hospital for evaluation and stabilization.  Associated Signs/Symptoms:  Depression Symptoms:  depressed mood, insomnia, feelings of worthlessness/guilt, hopelessness, anxiety,  (Hypo) Manic Symptoms:  Impulsivity, Labiality of Mood,  Anxiety Symptoms:  Excessive Worry,  Psychotic Symptoms:  Hallucinations: Visual Paranoia,  PTSD Symptoms: Negative  Total Time spent with patient: 1 hour  Past Psychiatric History: Patient has a past history of substance abuse.  Was involved in outpatient opiate dependence treatment with Suboxone.  No known previous hospitalizations.  Unclear if he has ever been on any other psychiatric medicine  Is the patient at risk to self? Yes.    Has the patient been a risk to  self in the past 6 months? Yes.    Has the patient been a risk to self within the distant past? Yes.    Is the patient a risk to others? No.  Has the patient been a risk to others in the past 6 months? No.  Has the patient been a risk to others within the distant past? No.   Prior Inpatient Therapy:  Yes [facility]) Prior Outpatient Therapy: Yes  Alcohol Screening: 1. How often do you have a drink containing alcohol?: Monthly or less 2. How many drinks containing alcohol do you have on a typical day when you are drinking?: 1 or 2 3. How often do you have six or more drinks on one occasion?: Less than monthly AUDIT-C Score: 2 4. How often during the last year have you found that you were not able to stop drinking once you had started?: Less than monthly 5. How often during the last year have you failed to do what was normally expected from you because of drinking?: Less than monthly 6. How often during the last year have you needed a first drink in the morning to get yourself going after a heavy drinking session?: Less than monthly 7. How often during the last year have you had a feeling of guilt of remorse after drinking?: Less than monthly 8. How often during the last year have you been unable to remember what happened the night before because you had been drinking?: Less than monthly 9. Have you or someone else been injured as a result of your drinking?: No 10. Has a relative or friend or a doctor or another health worker been concerned about your drinking or suggested you cut down?: No Alcohol Use Disorder Identification Test Final Score (AUDIT): 7 Alcohol Brief Interventions/Follow-up: Alcohol Education,AUDIT Score <7 follow-up not indicated  Substance Abuse History in the last 12 months: Yes.    Consequences of Substance Abuse: Medical Consequences:  Liver damage, Possible death by overdose Legal Consequences:  Arrests, jail time, Loss of driving privilege. Family Consequences:  Family discord, divorce and or separation.  Previous Psychotropic Medications: Yes, Citalopram  Psychological Evaluations: No   Past Medical History:  Past Medical History:  Diagnosis Date  . Anxiety   . Asthma   . Bipolar disorder (HCC)   . Cocaine abuse (HCC)   . Depression   . Endocarditis   .  Opioid abuse [facility])     Past Surgical History:  Procedure Laterality Date  . NO PAST SURGERIES     Family History: History reviewed. No pertinent family history.  Family Psychiatric  History: Denies any familial hx of mental illness.  Tobacco Screening: Have you used any form of tobacco in the last 30 days? (Cigarettes, Smokeless Tobacco, Cigars, and/or Pipes): Yes Tobacco use, Select all that apply: 5 or more cigarettes per day Are you interested in Tobacco Cessation Medications?: Yes, will notify MD for an order Counseled patient on smoking cessation including recognizing danger situations, developing coping skills and basic information about quitting provided: Yes  Social History:  Social History   Substance and Sexual Activity  Alcohol Use Yes   Comment: last use was 6 months ago, drinks 1 beer once a month     Social History   Substance and Sexual Activity  Drug Use Yes  . Types: Marijuana, Heroin, Cocaine   Comment: lat use of occaine & heroine was 1 week ago of 0.5 grams, uses "every couple of days"    Additional Social History:  Marital status: Single Are you sexually active?: Yes What is your sexual orientation?: Heterosexual Has your sexual activity been affected by drugs, alcohol, medication, or emotional stress?: Pt denies. Does patient have children?: Yes How many children?: 1 How is patient's relationship with their children?: pt's child lives in [place] with it's mother  Allergies:  No Known Allergies  Lab Results:  Results for orders placed or performed during the hospital encounter of 11/17/20 (from the past 48 hour(s))  Resp Panel by RT-PCR (Flu A&B, Covid) Nasopharyngeal Swab     Status: None   Collection Time: 11/19/20  6:59 PM   Specimen: Nasopharyngeal Swab; Nasopharyngeal(NP) swabs in vial transport medium  Result Value Ref Range   SARS Coronavirus 2 by RT PCR NEGATIVE NEGATIVE    Comment: (NOTE) SARS-CoV-2 target nucleic acids are NOT DETECTED.  The  SARS-CoV-2 RNA is generally detectable in upper respiratory specimens during the acute phase of infection. The lowest concentration of SARS-CoV-2 viral copies this assay can detect is 138 copies/mL. A negative result does not preclude SARS-Cov-2 infection and should not be used as the sole basis for treatment or other patient management decisions. A negative result may occur with  improper specimen collection/handling, submission of specimen other than nasopharyngeal swab, presence of viral mutation(s) within the areas targeted by this assay, and inadequate number of viral copies(<138 copies/mL). A negative result must be combined with clinical observations, patient history, and epidemiological information. The expected result is Negative.  Fact Sheet for Patients:  BloggerCourse.com  Fact Sheet for Healthcare Providers:  SeriousBroker.it  This test is no t yet approved or cleared by the [place] FDA and  has been authorized for detection and/or diagnosis of SARS-CoV-2 by FDA under an Emergency Use Authorization (EUA). This EUA will remain  in effect (meaning this test can be used) for the duration of the COVID-19 declaration under Section 564(b)(1) of the Act, 21 U.S.C.section 360bbb-3(b)(1), unless the authorization is terminated  or revoked sooner.       Influenza A by PCR NEGATIVE NEGATIVE   Influenza B by PCR NEGATIVE NEGATIVE    Comment: (NOTE) The Xpert Xpress SARS-CoV-2/FLU/RSV plus assay is intended as an aid in the diagnosis of influenza from Nasopharyngeal swab specimens and should not be used as a sole basis for treatment. Nasal washings and aspirates are unacceptable for Xpert Xpress SARS-CoV-2/FLU/RSV testing.  Fact Sheet for Patients: BloggerCourse.com  Fact Sheet for Healthcare Providers: SeriousBroker.it  This test is not yet approved or cleared by the  [place] FDA and has been authorized for detection and/or diagnosis of SARS-CoV-2 by FDA under an Emergency Use Authorization (EUA). This EUA will remain in effect (meaning this test can be used) for the duration of the COVID-19 declaration under Section 564(b)(1) of the Act, 21 U.S.C. section 360bbb-3(b)(1), unless the authorization is terminated or revoked.  Performed at [facility], [address], [place], [place] [address]     Blood Alcohol level:  Lab Results  Component Value Date   [facility] <10 11/17/2020   ETH <10 01/25/2020   Metabolic Disorder Labs:  No results found for: HGBA1C, MPG No results found for: PROLACTIN No results found for: CHOL, TRIG, HDL, CHOLHDL, VLDL, LDLCALC  Current Medications: Current Facility-Administered Medications  Medication Dose Route Frequency Provider Last Rate Last Admin  . acetaminophen (TYLENOL) tablet 650 mg  650 mg Oral Q6H PRN Aldean Baker, NP      . alum & mag hydroxide-simeth (MAALOX/MYLANTA) 200-200-20 MG/5ML suspension 30 mL  30 mL Oral Q4H PRN Aldean Baker, NP      . buprenorphine-naloxone (SUBOXONE) 8-2 mg per SL tablet 1 tablet  1 tablet Sublingual Daily Aldean Baker, NP   1 tablet at 11/21/20 6063  . citalopram (CELEXA) tablet 20 mg  20 mg Oral Daily Antonieta Pert, MD   20 mg at 11/21/20 0160  . hydrOXYzine (ATARAX/VISTARIL) tablet 25 mg  25 mg Oral Q6H PRN Aldean Baker, NP   25 mg at 11/20/20 1613  . magnesium hydroxide (MILK OF MAGNESIA) suspension 30 mL  30 mL Oral Daily PRN Aldean Baker, NP      . OLANZapine (ZYPREXA) tablet 7.5 mg  7.5 mg Oral QHS Antonieta Pert, MD      . potassium chloride SA (KLOR-CON) CR tablet 20 mEq  20 mEq Oral BID Antonieta Pert, MD   20 mEq at 11/21/20 1093  . traZODone (DESYREL) tablet 50 mg  50 mg Oral QHS PRN Aldean Baker, NP   50 mg at 11/19/20 2233   PTA Medications: Medications Prior to Admission  Medication Sig Dispense Refill Last Dose  . Aspirin-Acetaminophen  (GOODYS BODY PAIN PO) Take 1 packet by mouth daily as needed.     . buprenorphine (SUBUTEX) 8 MG SUBL SL tablet Place 8 mg under the tongue daily.     . buprenorphine-naloxone (SUBOXONE) 2-0.5 mg SUBL SL tablet Place 1 tablet under the tongue daily. (Patient not taking: Reported on 09/29/2020) 30 tablet    . buprenorphine-naloxone (SUBOXONE) 8-2 mg SUBL SL tablet Place 2 tablets under the tongue daily. (Patient not taking: No sig reported) 30 tablet    . citalopram (CELEXA) 20 MG tablet Take 1 tablet (20 mg total) by mouth daily. (Patient not taking: Reported on 11/17/2020) 30 tablet 0   . hydrOXYzine (ATARAX/VISTARIL) 25 MG tablet Take 1 tablet (25 mg total) by mouth 3 (three) times daily as needed for anxiety. 30 tablet 0   . OLANZapine (ZYPREXA) 5 MG tablet Take 5 mg by mouth daily.     . traZODone (DESYREL) 50 MG tablet Take 1 tablet (50 mg total) by mouth at bedtime as needed for sleep. (Patient not taking: Reported on 09/29/2020) 30 tablet 0    Musculoskeletal: Strength & Muscle Tone: within normal limits Gait & Station: normal Patient leans: N/A  Psychiatric Specialty Exam: Physical Exam Vitals and nursing note reviewed.  Constitutional:      Appearance: He is well-developed.  HENT:     Head: Normocephalic and atraumatic.     Mouth/Throat:     Pharynx: Oropharynx is clear.  Eyes:     Pupils: Pupils are equal, round, and reactive to light.  Cardiovascular:     Rate and Rhythm: Normal rate and regular rhythm.     Heart sounds: Normal heart sounds.  Pulmonary:     Effort: Pulmonary effort is normal. No respiratory distress.  Abdominal:     Palpations: Abdomen is soft.  Genitourinary:    Comments: Deferred Musculoskeletal:        General: Normal range of motion.     Cervical back: Normal range of motion.  Skin:    General: Skin is warm and dry.  Neurological:     General: No focal deficit present.     Mental Status: He is alert and oriented to person, place, and time.   Psychiatric:        Mood and Affect: Mood is anxious and depressed. Affect is blunt.  Speech: Speech is delayed.        Behavior: Behavior is slowed and withdrawn.        Thought Content: Thought content includes suicidal ideation. Thought content does not include suicidal plan.        Cognition and Memory: Memory is impaired.        Judgment: Judgment is impulsive.     Review of Systems  Constitutional: Negative.  Negative for chills, diaphoresis and fever.  HENT: Negative.  Negative for rhinorrhea, sneezing and sore throat.   Eyes: Negative.  Negative for discharge.  Respiratory: Negative.  Negative for cough, shortness of breath and wheezing.   Cardiovascular: Negative.  Negative for chest pain and palpitations.  Gastrointestinal: Negative.  Negative for abdominal pain, diarrhea, nausea and vomiting.  Endocrine: Negative for cold intolerance.  Genitourinary: Negative for difficulty urinating.  Musculoskeletal: Negative.  Negative for back pain and myalgias.  Skin: Negative.   Allergic/Immunologic:       Allergies: NKDA  Neurological: Negative.  Negative for dizziness, tremors, seizures, syncope, facial asymmetry, speech difficulty, weakness, light-headedness, numbness and headaches.  Psychiatric/Behavioral: Positive for behavioral problems, dysphoric mood, sleep disturbance and suicidal ideas. Negative for agitation, confusion, decreased concentration, hallucinations and self-injury. The patient is nervous/anxious. The patient is not hyperactive.     Blood pressure (P) 113/90, pulse (P) 65, temperature 98.3 F (36.8 C), temperature source Oral, resp. rate 18, height 5\' 11"  (1.803 m), weight 79.4 kg, SpO2 100 %.Body mass index is 24.41 kg/m.  General Appearance: Disheveled  Eye Contact:  Fair  Speech:  Clear and Coherent and Normal Rate  Volume:  Decreased  Mood:  Depressed and Dysphoric  Affect:  Congruent and Flat  Thought Process:  Coherent and Descriptions of  Associations: Intact  Orientation:  Full (Time, Place, and Person)  Thought Content:  Logical  Suicidal Thoughts:  Yes.  without intent/plan  Homicidal Thoughts:  No  Memory:  Immediate;   Poor Recent;   Poor Remote;   Poor  Judgement:  Intact  Insight:  Fair  Psychomotor Activity:  Decreased  Concentration:  Concentration: Fair and Attention Span: Fair  Recall:  [employer]is[employer]Fair  Fund of Knowledge:  Fair  Language:  Good  Akathisia:  Negative  Handed:  Right  AIMS (if indicated):     Assets:  Desire for Improvement Physical Health  ADL's:  Intact  Cognition:  WNL  Sleep:  Number of Hours: 7   Treatment Plan Summary: Daily contact with patient to assess and evaluate symptoms and progress in treatment and Medication management.  Treatment Plan/Recommendations: 1. Admit for crisis management and stabilization, estimated length of stay 3-5 days.  2. Medication management to reduce current symptoms to base line and improve the patient's overall level of functioning: [facility]. 3. Treat health problems as indicated.  4. Develop treatment plan to decrease risk of relapse upon discharge and the need for readmission.  5. Psycho-social education regarding relapse prevention and self care.  6. Health care follow up as needed for medical problems.  7. Review, reconcile, and reinstate any pertinent home medications for other health issues where appropriate. 8. Call for consults with hospitalist for any additional specialty patient care services as needed.  Observation Level/Precautions:  15 minute checks  Laboratory:  Per ED  Psychotherapy: Group sessions  [facility]  Consultations: As needed   Discharge Concerns: Safety, mood stability, maintaining sobriety    Estimated LOS: 2-4 days  Other: Admit to the 300-hall.  Physician Treatment Plan for Primary Diagnosis: Severe recurrent major depression without psychotic features (HCC) Long Term Goal(s): Improvement in  symptoms so as ready for discharge  Short Term Goals: Ability to identify changes in lifestyle to reduce recurrence of condition will improve, Ability to verbalize feelings will improve, Ability to disclose and discuss suicidal ideas and Ability to demonstrate self-control will improve  Physician Treatment Plan for Secondary Diagnosis: Principal Problem:   Severe recurrent major depression without psychotic features (HCC) Active Problems:   Substance induced mood disorder (HCC)  Long Term Goal(s): Improvement in symptoms so as ready for discharge  Short Term Goals: Ability to identify and develop effective coping behaviors will improve, Compliance with prescribed medications will improve and Ability to identify triggers associated with substance abuse/mental health issues will improve  I certify that inpatient services furnished can reasonably be expected to improve the patient's condition.    Armandina Stammer, NP, PMHNP, FNP-BC 1/1/20221:20 PM

## 2020-11-22 DIAGNOSIS — F323 Major depressive disorder, single episode, severe with psychotic features: Secondary | ICD-10-CM

## 2020-11-22 NOTE — Progress Notes (Signed)
   11/22/20 1100  Psych Admission Type (Psych Patients Only)  Admission Status Involuntary  Psychosocial Assessment  Patient Complaints None  Eye Contact Brief  Facial Expression Flat  Affect Appropriate to circumstance  Speech Logical/coherent  Interaction Assertive  Motor Activity Other (Comment) (WDL)  Appearance/Hygiene In scrubs;Poor hygiene  Behavior Characteristics Cooperative;Appropriate to situation  Mood Pleasant  Thought Process  Coherency WDL  Content WDL  Delusions None reported or observed  Perception WDL  Hallucination None reported or observed  Judgment Poor  Confusion None  Danger to Self  Current suicidal ideation? Denies  Danger to Others  Danger to Others None reported or observed

## 2020-11-22 NOTE — Progress Notes (Signed)
[place] NOVEL CORONAVIRUS (COVID-19) DAILY CHECK-OFF SYMPTOMS - answer yes or no to each - every day NO YES  Have you had a fever in the past 24 hours?  . Fever (Temp > 37.80C / 100F) X   Have you had any of these symptoms in the past 24 hours? . New Cough .  Sore Throat  .  Shortness of Breath .  Difficulty Breathing .  Unexplained Body Aches   X   Have you had any one of these symptoms in the past 24 hours not related to allergies?   . Runny Nose .  Nasal Congestion .  Sneezing   X   If you have had runny nose, nasal congestion, sneezing in the past 24 hours, has it worsened?  X   EXPOSURES - check yes or no X   Have you traveled outside the state in the past 14 days?  X   Have you been in contact with someone with a confirmed diagnosis of COVID-19 or PUI in the past 14 days without wearing appropriate PPE?  X   Have you been living in the same home as a person with confirmed diagnosis of COVID-19 or a PUI (household contact)?    X   Have you been diagnosed with COVID-19?    X              What to do next: Answered NO to all: Answered YES to anything:   Proceed with unit schedule Follow the [facility] Inpatient Flowsheet.   

## 2020-11-22 NOTE — Progress Notes (Signed)
[facility] MD Progress Note  11/22/2020 11:25 AM Nathaniel Hansen  MRN:  [id number] Subjective:  Patient reports that he slept well last night. Patient reports that this AM he was having some HI thoughts yesterday with no specific plan. Patient reports that the thoughts made him angry and he had to "push them out" because he did not like the way it made him feel. Patient report that he feels more in control and is not having the thoughts today and is not feeling angry towards others. Patient also reports that since he is on the Zyprexa he is not having AH. Patient does not endorse VH nor SI. Patient reports that he likes his medication regimen and feels that it is helping him and does not report any negative side effects at this time. Patient reports that his mood today is "so-so" as he is still feeling a bit "depressed and anxious about just some family stuff" but overall feels improvement. Patient reports that he is interested in therapy OP and feels this will really help. Otherwise patient reports that he is eating well and will do his best to stay awake today and attend more groups.  Principal Problem: Severe recurrent major depression without psychotic features ([place]) Diagnosis: Principal Problem:   Severe recurrent major depression without psychotic features ([place]) Active Problems:   Substance induced mood disorder ([place])  Total Time spent with patient: 15 minutes  Past Psychiatric History: See H&P  Past Medical History:  Past Medical History:  Diagnosis Date  . Anxiety   . Asthma   . Bipolar disorder ([place])   . Cocaine abuse ([place])   . Depression   . Endocarditis   . Opioid abuse [facility])     Past Surgical History:  Procedure Laterality Date  . NO PAST SURGERIES     Family History: History reviewed. No pertinent family history. Family Psychiatric  History: See H&P Social History:  Social History   Substance and Sexual Activity  Alcohol Use Yes   Comment: last use was 6 months ago,  drinks 1 beer once a month     Social History   Substance and Sexual Activity  Drug Use Yes  . Types: Marijuana, Heroin, Cocaine   Comment: lat use of occaine & heroine was 1 week ago of 0.5 grams, uses "every couple of days"    Social History   Socioeconomic History  . Marital status: Single    Spouse name: Not on file  . Number of children: Not on file  . Years of education: Not on file  . Highest education level: Not on file  Occupational History  . Not on file  Tobacco Use  . Smoking status: Current Every Day Smoker    Packs/day: 1.00    Types: Cigarettes  . Smokeless tobacco: Never Used  Vaping Use  . Vaping Use: Never used  Substance and Sexual Activity  . Alcohol use: Yes    Comment: last use was 6 months ago, drinks 1 beer once a month  . Drug use: Yes    Types: Marijuana, Heroin, Cocaine    Comment: lat use of occaine & heroine was 1 week ago of 0.5 grams, uses "every couple of days"  . Sexual activity: Yes  Other Topics Concern  . Not on file  Social History Narrative  . Not on file   Social Determinants of Health   Financial Resource Strain: Not on file  Food Insecurity: Not on file  Transportation Needs: Not on file  Physical  Activity: Not on file  Stress: Not on file  Social Connections: Not on file   Additional Social History:                         Sleep: Good  Appetite:  Good  Current Medications: Current Facility-Administered Medications  Medication Dose Route Frequency Provider Last Rate Last Admin  . acetaminophen (TYLENOL) tablet 650 mg  650 mg Oral Q6H PRN Aldean Baker, NP      . alum & mag hydroxide-simeth (MAALOX/MYLANTA) 200-200-20 MG/5ML suspension 30 mL  30 mL Oral Q4H PRN Aldean Baker, NP      . buprenorphine-naloxone (SUBOXONE) 8-2 mg per SL tablet 1 tablet  1 tablet Sublingual Daily Aldean Baker, NP   1 tablet at 11/22/20 0818  . citalopram (CELEXA) tablet 20 mg  20 mg Oral Daily Antonieta Pert, MD   20 mg  at 11/22/20 0818  . hydrOXYzine (ATARAX/VISTARIL) tablet 25 mg  25 mg Oral Q6H PRN Aldean Baker, NP   25 mg at 11/21/20 1623  . magnesium hydroxide (MILK OF MAGNESIA) suspension 30 mL  30 mL Oral Daily PRN Aldean Baker, NP      . nicotine polacrilex (NICORETTE) gum 2 mg  2 mg Oral PRN Antonieta Pert, MD      . OLANZapine [facility]) tablet 7.5 mg  7.5 mg Oral QHS Antonieta Pert, MD   7.5 mg at 11/21/20 2131  . traZODone (DESYREL) tablet 50 mg  50 mg Oral QHS PRN Aldean Baker, NP   50 mg at 11/21/20 2131    Lab Results: No results found for this or any previous visit (from the past 48 hour(s)).  Blood Alcohol level:  Lab Results  Component Value Date   ETH <10 11/17/2020   ETH <10 01/25/2020    Metabolic Disorder Labs: No results found for: HGBA1C, MPG No results found for: PROLACTIN No results found for: CHOL, TRIG, HDL, CHOLHDL, VLDL, LDLCALC  Physical Findings: AIMS: Facial and Oral Movements Muscles of Facial Expression: None, normal Lips and Perioral Area: None, normal Jaw: None, normal Tongue: None, normal,Extremity Movements Upper (arms, wrists, hands, fingers): None, normal Lower (legs, knees, ankles, toes): None, normal, Trunk Movements Neck, shoulders, hips: None, normal, Overall Severity Severity of abnormal movements (highest score from questions above): None, normal Incapacitation due to abnormal movements: None, normal Patient's awareness of abnormal movements (rate only patient's report): No Awareness, Dental Status Current problems with teeth and/or dentures?: No Does patient usually wear dentures?: No  CIWA:  CIWA-Ar Total: 0 COWS:  COWS Total Score: 1  Musculoskeletal: Strength & Muscle Tone: within normal limits Gait & Station: normal Patient leans: N/A  Psychiatric Specialty Exam: Physical Exam HENT:     Head: Normocephalic.  Pulmonary:     Effort: Pulmonary effort is normal.  Neurological:     Mental Status: He is alert.     Review  of Systems  Cardiovascular: Negative for chest pain.  Gastrointestinal: Positive for abdominal pain.  Neurological: Negative for headaches.    Blood pressure 112/73, pulse 68, temperature 97.7 F (36.5 C), temperature source Oral, resp. rate 20, height 5\' 11"  (1.803 m), weight 79.4 kg, SpO2 100 %.Body mass index is 24.41 kg/m.  General Appearance: Fairly Groomed  Eye Contact:  Minimal  Speech:  Clear and Coherent  Volume:  Normal  Mood:  "so-so"  Affect:  Depressed  Thought Process:  Coherent  Orientation:  NA  Thought Content:  Logical  Suicidal Thoughts:  No  Homicidal Thoughts:  No  Memory:  Recent;   Fair  Judgement:  Other:  Improving  Insight:  Fair  Psychomotor Activity:  Normal  Concentration:  Concentration: Good  Recall:  NA  Fund of Knowledge:  Good  Language:  Good  Akathisia:  No  Handed:  Right  AIMS (if indicated):     Assets:  Communication Skills Desire for Improvement Housing Physical Health  ADL's:  Intact  Cognition:  WNL  Sleep:  Number of Hours: 7     Treatment Plan Summary: Daily contact with patient to assess and evaluate symptoms and progress in treatment  Mr. 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 On eam today patient is future oriented and remains interested in receiving further outpatient therapy for his Substance use. Patient mood appears to be improving however patient has not had one full day without self reported HI w/o plan and will need to monitored today as he appears improved. Patient also appears to be endorse planned compliance with his medications as he reports feeling better when on the medications. Patient will need therapy and OP psychiatric f/u.   Severe recurrent major depression w/ psychotic features - Celexa 20mg  - Zyprexa 7.5mg   SIMD - Suboxone 8-2mg   PRN -Tylenol 650mg  q6h, pain -Maalox 38ml q4h, indigestion -Atarax 25mg  TID, anxiety -Milk of Mag  8mL, constiaption -Trazodone 50mg  QHS, insomnia PGY-1 31m, MD 11/22/2020, 11:25 AM

## 2020-11-22 NOTE — [facility] Group Notes (Signed)
Adult Psychoeducational Group Not Date:  11/22/2020 Time:  0900-1045 Group Topic/Focus: PROGRESSIVE RELAXATION. A group where deep breathing is taught and tensing and relaxation muscle groups is used. Imagery is used as well.  Pts are asked to imagine 3 pillars that hold them up when they are not able to hold themselves up.  Participation Level:  Active  Participation Quality:  Appropriate  Affect:  Appropriate  Cognitive:  Oriented  Insight: Improving  Engagement in Group:  Engaged  Modes of Intervention:  Activity, Discussion, Education, and Support  Additional Comments:  Pt states his 3 pillars are, God , his son and his aunt.  Nathaniel Hansen 11/22/2020

## 2020-11-22 NOTE — [facility] Group Notes (Signed)
LCSW Group Therapy Note  11/22/2020 10:00am-11:00am  Type of Therapy and Topic:  Group Therapy - Relating to Music to Understand Ourselves  Participation Level:  Did Not Attend   Description of Group This process group involved patients listening to a number of songs, then discussing how their emotions and/or lives relate to said songs.  This brought up patient descriptions of their anxiety, lack of confidence in themselves, acceptance of abuse in their lives, and use of substances to self-medicate.  In general, patients agreed that music can be used as a coping tool to express their feelings, have someone to relate to, and realize they are not alone.  Specifically, the songs played were: Dear Insecurity (about not wanting to continue giving anxiety permission to run one's life) Breaking Down (about making the choice not to continue using substances to deal with problems) You're Not The Only One (about everyone having problems) Warrior (about refusing to continue being other people's victim) I Am Enough (about choosing to look for the good in oneself, instead of only the bad) I Know Where I've Been (about celebrating the progress made so far)  Therapeutic Goals Patient will listen to the songs and be given the opportunity to talk about how they reacted to each Patient will empathize with each other over the pain shared Patient will be given a message of hope Patient will be exposed to the power of music as a coping tool   Summary of Patient Progress:  The patient did not attend group.   Therapeutic Modalities Processing Activity   Montrae Braithwaite J Grossman-Orr, LCSW  

## 2020-11-23 MED ORDER — WHITE PETROLATUM EX OINT
TOPICAL_OINTMENT | CUTANEOUS | Status: AC
  Filled 2020-11-23: qty 5

## 2020-11-23 NOTE — Progress Notes (Signed)
Pt has been present and interactive in the dayroom. His goal upon discharge is to attend a rehab center. He acknowledges that he has been in a rehab center before and was able stay sober for a year. He thinks he relapsed this time because he left the recovery center too soon. He does not plan on doing that this time. Pt denies SI/HI and AVH. Medications administered as ordered by MD. Active listening, reassurance, and support provided. Q 15 min safety checks continue. Pt's safety has been maintained.   11/23/20 2106  Psych Admission Type (Psych Patients Only)  Admission Status Involuntary  Psychosocial Assessment  Patient Complaints None  Eye Contact Fair  Facial Expression Flat  Affect Appropriate to circumstance  Speech Logical/coherent  Interaction Assertive  Motor Activity Other (Comment) (WDL)  Appearance/Hygiene In scrubs;Poor hygiene  Behavior Characteristics Cooperative;Calm;Pacing  Mood Pleasant  Thought Process  Coherency WDL  Content WDL  Delusions None reported or observed  Perception WDL  Hallucination None reported or observed  Judgment Poor  Confusion None  Danger to Self  Current suicidal ideation? Denies  Danger to Others  Danger to Others None reported or observed

## 2020-11-23 NOTE — Progress Notes (Signed)
Patient rated his day as a 7 out of a possible 10. He states that he had a pretty good day but did not go into further detail. His goal for tomorrow is to attend groups.

## 2020-11-23 NOTE — Progress Notes (Signed)
Pt has been in bed since the beginning of the shift, the writer has not been able to interact with pt,. Pt is resting even and unlabored respiration, will continue to monitor.

## 2020-11-23 NOTE — Progress Notes (Signed)
[facility] MD Progress Note  11/23/2020 11:41 AM Nathaniel Hansen  MRN:  [id number] Subjective:  This AM patient reports that he slept well and that his appetite has significantly improved since admission. Patient reports that his mood today is "good, trying to come out and interact more today." Patient reports that he is feeling a bit better and making an effort to spend less time in his room. Patient reports that he is still interested in a rehab program. Patient does not endorse HI nor AVH and contracts for safety.  Principal Problem: Severe major depression with psychotic features ([facility]) Diagnosis: Principal Problem:   Severe major depression with psychotic features ([facility]) Active Problems:   Substance induced mood disorder ([facility])  Total Time spent with patient: 15 minutes  Past Psychiatric History: See H&P  Past Medical History:  Past Medical History:  Diagnosis Date  . Anxiety   . Asthma   . Bipolar disorder ([facility])   . Cocaine abuse ([facility])   . Depression   . Endocarditis   . Opioid abuse [facility])     Past Surgical History:  Procedure Laterality Date  . NO PAST SURGERIES     Family History: History reviewed. No pertinent family history. Family Psychiatric  History: See H&P Social History:  Social History   Substance and Sexual Activity  Alcohol Use Yes   Comment: last use was 6 months ago, drinks 1 beer once a month     Social History   Substance and Sexual Activity  Drug Use Yes  . Types: Marijuana, Heroin, Cocaine   Comment: lat use of occaine & heroine was 1 week ago of 0.5 grams, uses "every couple of days"    Social History   Socioeconomic History  . Marital status: Single    Spouse name: Not on file  . Number of children: Not on file  . Years of education: Not on file  . Highest education level: Not on file  Occupational History  . Not on file  Tobacco Use  . Smoking status: Current Every Day Smoker    Packs/day: 1.00    Types: Cigarettes  . Smokeless tobacco:  Never Used  Vaping Use  . Vaping Use: Never used  Substance and Sexual Activity  . Alcohol use: Yes    Comment: last use was 6 months ago, drinks 1 beer once a month  . Drug use: Yes    Types: Marijuana, Heroin, Cocaine    Comment: lat use of occaine & heroine was 1 week ago of 0.5 grams, uses "every couple of days"  . Sexual activity: Yes  Other Topics Concern  . Not on file  Social History Narrative  . Not on file   Social Determinants of Health   Financial Resource Strain: Not on file  Food Insecurity: Not on file  Transportation Needs: Not on file  Physical Activity: Not on file  Stress: Not on file  Social Connections: Not on file   Additional Social History:                         Sleep: Good  Appetite:  Good  Current Medications: Current Facility-Administered Medications  Medication Dose Route Frequency Provider Last Rate Last Admin  . acetaminophen (TYLENOL) tablet 650 mg  650 mg Oral Q6H PRN Aldean Baker, NP      . alum & mag hydroxide-simeth (MAALOX/MYLANTA) 200-200-20 MG/5ML suspension 30 mL  30 mL Oral Q4H PRN Aldean Baker, NP      .  buprenorphine-naloxone (SUBOXONE) 8-2 mg per SL tablet 1 tablet  1 tablet Sublingual Daily Connye Burkitt, NP   1 tablet at 11/23/20 [phone]  . citalopram (CELEXA) tablet 20 mg  20 mg Oral Daily Sharma Covert, MD   20 mg at 11/23/20 1601  . hydrOXYzine (ATARAX/VISTARIL) tablet 25 mg  25 mg Oral Q6H PRN Connye Burkitt, NP   25 mg at 11/23/20 0932  . magnesium hydroxide (MILK OF MAGNESIA) suspension 30 mL  30 mL Oral Daily PRN Connye Burkitt, NP      . nicotine polacrilex (NICORETTE) gum 2 mg  2 mg Oral PRN Sharma Covert, MD      . OLANZapine [facility]) tablet 7.5 mg  7.5 mg Oral QHS Sharma Covert, MD   7.5 mg at 11/21/20 2131  . traZODone (DESYREL) tablet 50 mg  50 mg Oral QHS PRN Connye Burkitt, NP   50 mg at 11/21/20 2131    Lab Results: No results found for this or any previous visit (from the past 48  hour(s)).  Blood Alcohol level:  Lab Results  Component Value Date   ETH <10 11/17/2020   ETH <10 35/57/3220    Metabolic Disorder Labs: No results found for: HGBA1C, MPG No results found for: PROLACTIN No results found for: CHOL, TRIG, HDL, CHOLHDL, VLDL, LDLCALC  Physical Findings: AIMS: Facial and Oral Movements Muscles of Facial Expression: None, normal Lips and Perioral Area: None, normal Jaw: None, normal Tongue: None, normal,Extremity Movements Upper (arms, wrists, hands, fingers): None, normal Lower (legs, knees, ankles, toes): None, normal, Trunk Movements Neck, shoulders, hips: None, normal, Overall Severity Severity of abnormal movements (highest score from questions above): None, normal Incapacitation due to abnormal movements: None, normal Patient's awareness of abnormal movements (rate only patient's report): No Awareness, Dental Status Current problems with teeth and/or dentures?: No Does patient usually wear dentures?: No  CIWA:  CIWA-Ar Total: 0 COWS:  COWS Total Score: 1  Musculoskeletal: Strength & Muscle Tone: within normal limits Gait & Station: normal Patient leans: N/A  Psychiatric Specialty Exam: Physical Exam Constitutional:      Appearance: Normal appearance.  HENT:     Head: Normocephalic and atraumatic.  Pulmonary:     Effort: Pulmonary effort is normal.  Neurological:     Mental Status: He is alert.     Review of Systems  Cardiovascular: Negative for chest pain.  Gastrointestinal: Negative for abdominal pain.  Neurological: Negative for headaches.    Blood pressure 112/73, pulse 68, temperature 97.7 F (36.5 C), temperature source Oral, resp. rate 20, height 5\' 11"  (1.803 m), weight 79.4 kg, SpO2 100 %.Body mass index is 24.41 kg/m.  General Appearance: Fairly Groomed  Eye Contact:  Good  Speech:  Clear and Coherent  Volume:  Normal  Mood:  Euthymic  Affect:  Congruent  Thought Process:  Coherent  Orientation:  NA  Thought  Content:  Logical  Suicidal Thoughts:  No  Homicidal Thoughts:  No  Memory:  Recent;   Good  Judgement:  Fair  Insight:  Improving  Psychomotor Activity:  Normal  Concentration:  Concentration: Fair  Recall:  NA  Fund of Knowledge:  Good  Language:  Good  Akathisia:  No  Handed:  Right  AIMS (if indicated):     Assets:  Communication Skills Desire for Improvement Physical Health Resilience  ADL's:  Intact  Cognition:  WNL  Sleep:  Number of Hours: 7     Treatment Plan Summary:  Daily contact with patient to assess and evaluate symptoms and progress in treatment  Mr. Hornaday is a 35 yo patient w/ dx of severe recurrent major depression w/ psychotic features and SIMD.  Patient continues to improve and appears to be less isolative today. Patient is interested in rehab at outpatient to treat his substance use. Patient reports that he has been in a facility before and had some success before relapsing. SW is working with patient. Will continue to monitor patient but he appears to be doing well on his current regimen.   Severe recurrent major depression w/ psychotic features - Celexa 20mg  - Zyprexa 7.5mg   SIMD - Suboxone 8-2mg   PRN -Tylenol 650mg  q6h, pain -Maalox 52ml q4h, indigestion -Atarax 25mg  TID, anxiety -Milk of Mag 35mL, constiaption -Trazodone 50mg  QHS, insomnia PGY-1 31m, MD 11/23/2020, 11:41 AM

## 2020-11-23 NOTE — [facility] Group Notes (Signed)
Occupational Therapy Group Note Date: 11/23/2020 Group Topic/Focus: Brain Fitness  Group Description: Group encouraged increased social engagement and participation through discussion/activity focused on brain fitness. Patients were provided education on various brain fitness activities/strategies, with explanation provided on the qualifying factors including: one, that is has to be challenging/hard and two, it has to be something that you do not do every day. Patients engaged actively during group session in various brain fitness activities to increase attention, concentration, and problem-solving skills. Discussion followed with a focus on identifying the benefits of brain fitness activities as use for adaptive coping strategies and distraction.    Therapeutic Goal(s): Identify benefit(s) of brain fitness activities as use for adaptive coping and healthy distraction. Identify specific brain fitness activities to engage in as use for adaptive coping and healthy distraction. Participation Level: Active   Participation Quality: Independent   Behavior: Calm, Cooperative and Interactive   Speech/Thought Process: Focused   Affect/Mood: Full range   Insight: Fair   Judgement: Fair   Individualization: Nathaniel Hansen was active in their participation of discussion and activities, noting some brain activities to be more challenging than others. Pt willing to engage despite challenge and identified "listening to music" as something he likes to do as a leisure activity and healthy distraction. Pt also expressed interest in pursuing more brain fitness activities in the future.   Modes of Intervention: Activity, Discussion, Education and Problem-solving  Patient Response to Interventions:  Attentive, Engaged, Receptive and Interested   Plan: Continue to engage patient in OT groups 2 - 3x/week.  11/23/2020  Donne Hazel, MOT, OTR/L

## 2020-11-23 NOTE — [facility] Group Notes (Signed)
[facility] LCSW Group Therapy  11/23/2020 2:24 PM  Type of Therapy:  Living a balanced life  Participation Level:  Active  Participation Quality:  Appropriate  Affect:  Appropriate  Cognitive:  Appropriate  Insight:  Developing/Improving  Engagement in Therapy:  Developing/Improving  Modes of Intervention:  Activity and Discussion  Summary of Progress/Problems: Nathaniel Hansen attended group and remained there the entire time.  The Pt reported that for him balance is being organized.  Nathaniel Hansen states that he wants to have balance in his life that includes being able to have time to himself.  Nathaniel Hansen accepted the handouts that were provided during group.    [place] Kung Rebekah Sprinkle 11/23/2020, 2:24 PM

## 2020-11-23 NOTE — Progress Notes (Addendum)
Recreation Therapy Notes  Date:  1.3.22 Time: 0930 Location: [address] Dayroom  Group Topic: Stress Management  Goal Area(s) Addresses:  Patient will identify positive stress management techniques. Patient will identify benefits of using stress management post d/c.  Behavioral Response: Minimal  Intervention: Stress Management  Activity:  Meditation.  LRT played a meditation that focused on self trust.  The meditation talked about how we sometimes doubt ourselves because we haven't accomplished the things we have set out to do.  Patients were to listen and follow along as the meditation played to participate.    Education:  Stress Management, Discharge Planning.   Education Outcome: Acknowledges Education  Clinical Observations/Feedback: Pt attended and participated in activity.  Pt sat in group reading and flipped through magazines as meditation played but wasn't a bother to the rest of the patients.    Caroll Rancher, LRT/CTRS         Caroll Rancher A 11/23/2020 11:14 AM

## 2020-11-23 NOTE — Progress Notes (Signed)
   11/23/20 2106  COVID-19 Daily Checkoff  Have you had a fever (temp > 37.80C/100F)  in the past 24 hours?  No  COVID-19 EXPOSURE  Have you traveled outside the state in the past 14 days? No  Have you been in contact with someone with a confirmed diagnosis of COVID-19 or PUI in the past 14 days without wearing appropriate PPE? No  Have you been living in the same home as a person with confirmed diagnosis of COVID-19 or a PUI (household contact)? No  Have you been diagnosed with COVID-19? No

## 2020-11-23 NOTE — Progress Notes (Signed)
  D: Patient denies SI/HI/AVH. Pt. Rated anxiety 5/10 and denied depression. Pt. Out in open areas and attended group.  A:  Patient took scheduled medicine.  Support and encouragement provided Routine safety checks conducted every 15 minutes. Patient  Informed to notify staff with any concerns.   R: Safety maintained.

## 2020-11-24 DIAGNOSIS — F1210 Cannabis abuse, uncomplicated: Secondary | ICD-10-CM | POA: Diagnosis present

## 2020-11-24 DIAGNOSIS — F1590 Other stimulant use, unspecified, uncomplicated: Secondary | ICD-10-CM | POA: Diagnosis present

## 2020-11-24 MED ORDER — ONDANSETRON 4 MG PO TBDP: 4.0000 mg | ORAL_TABLET | Freq: Four times a day (QID) | ORAL | Status: DC | PRN

## 2020-11-24 MED ORDER — LOPERAMIDE HCL 2 MG PO CAPS
2.0000 mg | ORAL_CAPSULE | ORAL | Status: DC | PRN
Start: 2020-11-24 — End: 2020-11-27

## 2020-11-24 MED ORDER — NAPROXEN 500 MG PO TABS
500.0000 mg | ORAL_TABLET | Freq: Two times a day (BID) | ORAL | Status: DC | PRN
  Administered 2020-11-25: 500 mg via ORAL
  Filled 2020-11-24: qty 1

## 2020-11-24 MED ORDER — DICYCLOMINE HCL 20 MG PO TABS: 20.0000 mg | ORAL_TABLET | Freq: Four times a day (QID) | ORAL | Status: DC | PRN

## 2020-11-24 MED ORDER — METHOCARBAMOL 500 MG PO TABS
500.0000 mg | ORAL_TABLET | Freq: Three times a day (TID) | ORAL | Status: DC | PRN
Start: 2020-11-24 — End: 2020-11-27
  Administered 2020-11-25: 500 mg via ORAL
  Filled 2020-11-24: qty 1

## 2020-11-24 NOTE — BHH Counselor (Signed)
CSW spoke with Arnette Felts of [facility].  Mr. Donavan Foil states that he believes Vidyuth has been to this facility before but would need to check.  Mr. Donavan Foil states that Asael will need to do a screening at 8:30am tomorrow morning. Demarie will need to call [phone].  Mr. Donavan Foil states that at this time he does not see a reason why Tolbert could not return to the facility but will need to complete the phone call with him to make sure.  CSW will remind Maxamillian to make this phone call in tomorrow morning.  CSW provided Kenner with the phone number and asked him to call at 8:30am tomorrow morning. Rio smiled and appeared happy about the opportunity.

## 2020-11-24 NOTE — BHH Counselor (Signed)
CSW contacted Mrs. June at [facility] to make sure that all paperwork was received for the referral.  Mrs. June was not available. CSW left a voicemail for Mrs. June asking for a return call to discuss if Khi is appropriate for the residential service and when a phone call assessment can be scheduled.  CSW will attempt to contact Mrs. June again tomorrow on 11/25/20.  CSW will then discuss the options with [facility].

## 2020-11-24 NOTE — Progress Notes (Signed)
D: Patient denies SI/HI/AVH. Patient rated both anxiety and depression 3/10. Pt. Out in open areas and was social with staff.  A:  Patient took scheduled medicine.  Support and encouragement provided Routine safety checks conducted every 15 minutes. Patient  Informed to notify staff with any concerns.   R: Safety maintained.

## 2020-11-24 NOTE — Progress Notes (Signed)
   11/24/20 2104  COVID-19 Daily Checkoff  Have you had a fever (temp > 37.80C/100F)  in the past 24 hours?  No  COVID-19 EXPOSURE  Have you traveled outside the state in the past 14 days? No  Have you been in contact with someone with a confirmed diagnosis of COVID-19 or PUI in the past 14 days without wearing appropriate PPE? No  Have you been living in the same home as a person with confirmed diagnosis of COVID-19 or a PUI (household contact)? No  Have you been diagnosed with COVID-19? No

## 2020-11-24 NOTE — Progress Notes (Signed)
Spoke with Angie at [facility] wellness at [phone] Verified pts last dose from them was 12/19 and he received 6 mg of subutex Pt is able to return Pt does have issues with compliance and misses days often and dose is often changing.  Peggye Fothergill, [place] D

## 2020-11-24 NOTE — Progress Notes (Signed)
The patient mentioned in group that he learned today how to stay calm. He also states that he had a good day overall. His goal for tomorrow is to inquire about long-term care options with his Child psychotherapist.

## 2020-11-24 NOTE — Progress Notes (Addendum)
[facility] MD Progress Note  11/24/2020 10:07 AM Nathaniel Hansen  MRN:  [id number]   Subjective:  This AM patient reports that he continues to sleep well and his appetite continues to improve. Patient reports that he was successful in reaching his goal of staying out of his room. Patient reports that he attended all the groups offered and his most memorable was with the chaplain. Beyond prayer the patient reports that he has realized he needs to find better coping mechanisms for his stress and has considered playing basketball or reaching out to someone to talk. Patient reports that he has had trouble with the later in the past. Patient does contract for safety and does not endorse HI nor AVH. Patient reports that he has not had feelings of anger towards individuals in his past in the past 24h and he is trying to focus on sobriety as he remembers how good he felt last time.  Principal Problem: Severe major depression with psychotic features (HCC) Diagnosis: Principal Problem:   Severe major depression with psychotic features (HCC) Active Problems:   Opioid dependence (HCC)   Stimulant use disorder   Marijuana abuse r/o substance induced psychotic d/o or substance induced depressive d/o  Total Time spent with patient: 15 minutes  Past Psychiatric History: See H&P  Past Medical History:  Past Medical History:  Diagnosis Date  . Anxiety   . Asthma   . Bipolar disorder (HCC)   . Cocaine abuse (HCC)   . Depression   . Endocarditis   . Opioid abuse [facility])     Past Surgical History:  Procedure Laterality Date  . NO PAST SURGERIES     Family History: History reviewed. No pertinent family history. Family Psychiatric  History: See H&P Social History:  Social History   Substance and Sexual Activity  Alcohol Use Yes   Comment: last use was 6 months ago, drinks 1 beer once a month     Social History   Substance and Sexual Activity  Drug Use Yes  . Types: Marijuana, Heroin, Cocaine    Comment: lat use of occaine & heroine was 1 week ago of 0.5 grams, uses "every couple of days"    Social History   Socioeconomic History  . Marital status: Single    Spouse name: Not on file  . Number of children: Not on file  . Years of education: Not on file  . Highest education level: Not on file  Occupational History  . Not on file  Tobacco Use  . Smoking status: Current Every Day Smoker    Packs/day: 1.00    Types: Cigarettes  . Smokeless tobacco: Never Used  Vaping Use  . Vaping Use: Never used  Substance and Sexual Activity  . Alcohol use: Yes    Comment: last use was 6 months ago, drinks 1 beer once a month  . Drug use: Yes    Types: Marijuana, Heroin, Cocaine    Comment: lat use of occaine & heroine was 1 week ago of 0.5 grams, uses "every couple of days"  . Sexual activity: Yes  Other Topics Concern  . Not on file  Social History Narrative  . Not on file   Social Determinants of Health   Financial Resource Strain: Not on file  Food Insecurity: Not on file  Transportation Needs: Not on file  Physical Activity: Not on file  Stress: Not on file  Social Connections: Not on file   Additional Social History:  Sleep: Good  Appetite:  Good  Current Medications: Current Facility-Administered Medications  Medication Dose Route Frequency Provider Last Rate Last Admin  . acetaminophen (TYLENOL) tablet 650 mg  650 mg Oral Q6H PRN Aldean Baker, NP      . alum & mag hydroxide-simeth (MAALOX/MYLANTA) 200-200-20 MG/5ML suspension 30 mL  30 mL Oral Q4H PRN Aldean Baker, NP      . citalopram (CELEXA) tablet 20 mg  20 mg Oral Daily Antonieta Pert, MD   20 mg at 11/24/20 0932  . dicyclomine (BENTYL) tablet 20 mg  20 mg Oral Q6H PRN Eliseo Gum B, MD      . hydrOXYzine (ATARAX/VISTARIL) tablet 25 mg  25 mg Oral Q6H PRN Aldean Baker, NP   25 mg at 11/23/20 2106  . loperamide (IMODIUM) capsule 2-4 mg  2-4 mg Oral PRN Eliseo Gum  B, MD      . magnesium hydroxide (MILK OF MAGNESIA) suspension 30 mL  30 mL Oral Daily PRN Aldean Baker, NP      . methocarbamol (ROBAXIN) tablet 500 mg  500 mg Oral Q8H PRN Eliseo Gum B, MD      . naproxen (NAPROSYN) tablet 500 mg  500 mg Oral BID PRN Eliseo Gum B, MD      . nicotine polacrilex (NICORETTE) gum 2 mg  2 mg Oral PRN Antonieta Pert, MD      . OLANZapine [facility]) tablet 7.5 mg  7.5 mg Oral QHS Antonieta Pert, MD   7.5 mg at 11/23/20 2106  . ondansetron (ZOFRAN-ODT) disintegrating tablet 4 mg  4 mg Oral Q6H PRN Eliseo Gum B, MD      . traZODone (DESYREL) tablet 50 mg  50 mg Oral QHS PRN Aldean Baker, NP   50 mg at 11/23/20 2106    Lab Results: No results found for this or any previous visit (from the past 48 hour(s)).  Blood Alcohol level:  Lab Results  Component Value Date   ETH <10 11/17/2020   ETH <10 01/25/2020    Metabolic Disorder Labs: No results found for: HGBA1C, MPG No results found for: PROLACTIN No results found for: CHOL, TRIG, HDL, CHOLHDL, VLDL, LDLCALC  Physical Findings: AIMS: Facial and Oral Movements Muscles of Facial Expression: None, normal Lips and Perioral Area: None, normal Jaw: None, normal Tongue: None, normal,Extremity Movements Upper (arms, wrists, hands, fingers): None, normal Lower (legs, knees, ankles, toes): None, normal, Trunk Movements Neck, shoulders, hips: None, normal, Overall Severity Severity of abnormal movements (highest score from questions above): None, normal Incapacitation due to abnormal movements: None, normal Patient's awareness of abnormal movements (rate only patient's report): No Awareness, Dental Status Current problems with teeth and/or dentures?: No Does patient usually wear dentures?: No  CIWA:  CIWA-Ar Total: 0 COWS:  COWS Total Score: 1  Musculoskeletal: Strength & Muscle Tone: within normal limits Gait & Station: normal Patient leans: N/A  Psychiatric Specialty Exam: Physical  Exam Constitutional:      Appearance: Normal appearance.  HENT:     Head: Normocephalic and atraumatic.  Pulmonary:     Effort: Pulmonary effort is normal.  Neurological:     Mental Status: He is alert.     Review of Systems  Cardiovascular: Negative for chest pain.  Gastrointestinal: Negative for abdominal pain.  Neurological: Negative for headaches.    Blood pressure 112/73, pulse 68, temperature 97.7 F (36.5 C), temperature source Oral, resp. rate 20, height 5\' 11"  (1.803 m), weight  79.4 kg, SpO2 100 %.Body mass index is 24.41 kg/m.  General Appearance: Casual  Eye Contact:  Fair  Speech:  Clear and Coherent  Volume:  Normal  Mood:  dysphoric  Affect:  Mildly restricted  Thought Process:  Coherent  Orientation:  NA  Thought Content:  Logical  Suicidal Thoughts:  No  Homicidal Thoughts:  No  Memory:  Recent;   Good  Judgement:  Fair  Insight:  Fair  Psychomotor Activity:  Normal  Concentration:  Concentration: Good  Recall:  NA  Fund of Knowledge:  Good  Language:  Good  Akathisia:  No  Handed:  Right  AIMS (if indicated):     Assets:  Communication Skills Desire for Improvement Leisure Time  ADL's:  Intact  Cognition:  WNL  Sleep:  Number of Hours: 6.25     Treatment Plan Summary: Daily contact with patient to assess and evaluate symptoms and progress in treatment  Mr. Whidby is a 35 yo patient w/ dx of severe recurrent major depression w/ psychotic features (r/o substance induced psychotic d/o or substance induced depressive d/o). Patient's mood and appetite continue to improve. Patient is very motivated to go to a rehab facility. Unfortunately his options are hindered due to his Suboxone use. Patient is willing to come off the suboxone in order to be accepted into a residential facility. Patient insight is improved as he recognizes that his substance use is a poor coping mechanism for his dysphoric mood and anxiety and patient is working to find better  ways to cope. Patient continues to have his guard up during conversations as he is still vague about what is bothering him; however he does have the insight to recognize that he has had a problem opening up in the past and appears to be working to become better at this as he feels this will help him in his recovery process.   At this time patient will be monitored for withdrawal for detox with social work to refer to a residential rehab program. Social work to confirm how long he has to be off Suboxone prior to being accepted to a rehab program for disposition planning.  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

## 2020-11-25 NOTE — BHH Counselor (Addendum)
CSW spoke with Loistine Chance (the Quarry manager) at [facility] [phone] who confirms that Demani can come to the facility on 11/27/20 at 11:00am.  CSW informed Burns of this information and he states that he is very happy to get accepted by this facility.

## 2020-11-25 NOTE — Progress Notes (Signed)
Pt refused his scheduled blood tests this morning. Pt was educated about the need to obtain his lab work in regards to his medications. He was also provided reassurance and support, but still refused. Pt said that once he had a transfusion and almost passed out.

## 2020-11-25 NOTE — Tx Team (Addendum)
Interdisciplinary Treatment and Diagnostic Plan Update  11/25/2020 Time of Session: 9:30am  Nathaniel Hansen MRN: [id number]  Principal Diagnosis: Severe major depression with psychotic features [facility])  Secondary Diagnoses: Principal Problem:   Severe major depression with psychotic features (HCC) Active Problems:   Opioid dependence (HCC)   Stimulant use disorder   Marijuana abuse   Current Medications:  Current Facility-Administered Medications  Medication Dose Route Frequency Provider Last Rate Last Admin  . acetaminophen (TYLENOL) tablet 650 mg  650 mg Oral Q6H PRN Aldean Baker, NP      . alum & mag hydroxide-simeth (MAALOX/MYLANTA) 200-200-20 MG/5ML suspension 30 mL  30 mL Oral Q4H PRN Aldean Baker, NP      . citalopram (CELEXA) tablet 20 mg  20 mg Oral Daily Antonieta Pert, MD   20 mg at 11/25/20 3710  . dicyclomine (BENTYL) tablet 20 mg  20 mg Oral Q6H PRN Eliseo Gum B, MD      . hydrOXYzine (ATARAX/VISTARIL) tablet 25 mg  25 mg Oral Q6H PRN Aldean Baker, NP   25 mg at 11/24/20 2104  . loperamide (IMODIUM) capsule 2-4 mg  2-4 mg Oral PRN Eliseo Gum B, MD      . magnesium hydroxide (MILK OF MAGNESIA) suspension 30 mL  30 mL Oral Daily PRN Aldean Baker, NP      . methocarbamol (ROBAXIN) tablet 500 mg  500 mg Oral Q8H PRN Eliseo Gum B, MD   500 mg at 11/25/20 6269  . naproxen (NAPROSYN) tablet 500 mg  500 mg Oral BID PRN Eliseo Gum B, MD   500 mg at 11/25/20 0815  . nicotine polacrilex (NICORETTE) gum 2 mg  2 mg Oral PRN Antonieta Pert, MD      . OLANZapine [facility]) tablet 7.5 mg  7.5 mg Oral QHS Antonieta Pert, MD   7.5 mg at 11/24/20 2104  . ondansetron (ZOFRAN-ODT) disintegrating tablet 4 mg  4 mg Oral Q6H PRN Eliseo Gum B, MD      . traZODone (DESYREL) tablet 50 mg  50 mg Oral QHS PRN Aldean Baker, NP   50 mg at 11/24/20 2104   PTA Medications: Medications Prior to Admission  Medication Sig Dispense Refill Last Dose  .  Aspirin-Acetaminophen (GOODYS BODY PAIN PO) Take 1 packet by mouth daily as needed.     . buprenorphine (SUBUTEX) 8 MG SUBL SL tablet Place 8 mg under the tongue daily.     . buprenorphine-naloxone (SUBOXONE) 2-0.5 mg SUBL SL tablet Place 1 tablet under the tongue daily. (Patient not taking: Reported on 09/29/2020) 30 tablet    . buprenorphine-naloxone (SUBOXONE) 8-2 mg SUBL SL tablet Place 2 tablets under the tongue daily. (Patient not taking: No sig reported) 30 tablet    . citalopram (CELEXA) 20 MG tablet Take 1 tablet (20 mg total) by mouth daily. (Patient not taking: Reported on 11/17/2020) 30 tablet 0   . hydrOXYzine (ATARAX/VISTARIL) 25 MG tablet Take 1 tablet (25 mg total) by mouth 3 (three) times daily as needed for anxiety. 30 tablet 0   . OLANZapine (ZYPREXA) 5 MG tablet Take 5 mg by mouth daily.     . traZODone (DESYREL) 50 MG tablet Take 1 tablet (50 mg total) by mouth at bedtime as needed for sleep. (Patient not taking: Reported on 09/29/2020) 30 tablet 0     Patient Stressors: Financial difficulties Medication change or noncompliance Substance abuse  Patient Strengths: Ability for insight Average or above  average intelligence Capable of independent living Communication skills Motivation for treatment/growth  Treatment Modalities: Medication Management, Group therapy, Case management,  1 to 1 session with clinician, Psychoeducation, Recreational therapy.   Physician Treatment Plan for Primary Diagnosis: Severe major depression with psychotic features ([place]) Long Term Goal(s): Improvement in symptoms so as ready for discharge Improvement in symptoms so as ready for discharge   Short Term Goals: Ability to identify changes in lifestyle to reduce recurrence of condition will improve Ability to verbalize feelings will improve Ability to disclose and discuss suicidal ideas Ability to demonstrate self-control will improve Ability to identify and develop effective coping behaviors  will improve Compliance with prescribed medications will improve Ability to identify triggers associated with substance abuse/mental health issues will improve  Medication Management: Evaluate patient's response, side effects, and tolerance of medication regimen.  Therapeutic Interventions: 1 to 1 sessions, Unit Group sessions and Medication administration.  Evaluation of Outcomes: Progressing  Physician Treatment Plan for Secondary Diagnosis: Principal Problem:   Severe major depression with psychotic features ([place]) Active Problems:   Opioid dependence ([place])   Stimulant use disorder   Marijuana abuse  Long Term Goal(s): Improvement in symptoms so as ready for discharge Improvement in symptoms so as ready for discharge   Short Term Goals: Ability to identify changes in lifestyle to reduce recurrence of condition will improve Ability to verbalize feelings will improve Ability to disclose and discuss suicidal ideas Ability to demonstrate self-control will improve Ability to identify and develop effective coping behaviors will improve Compliance with prescribed medications will improve Ability to identify triggers associated with substance abuse/mental health issues will improve     Medication Management: Evaluate patient's response, side effects, and tolerance of medication regimen.  Therapeutic Interventions: 1 to 1 sessions, Unit Group sessions and Medication administration.  Evaluation of Outcomes: Progressing   RN Treatment Plan for Primary Diagnosis: Severe major depression with psychotic features ([place]) Long Term Goal(s): Knowledge of disease and therapeutic regimen to maintain health will improve  Short Term Goals: Ability to remain free from injury will improve, Ability to participate in decision making will improve, Ability to verbalize feelings will improve, Ability to disclose and discuss suicidal ideas and Ability to identify and develop effective coping behaviors will  improve  Medication Management: RN will administer medications as ordered by provider, will assess and evaluate patient's response and provide education to patient for prescribed medication. RN will report any adverse and/or side effects to prescribing provider.  Therapeutic Interventions: 1 on 1 counseling sessions, Psychoeducation, Medication administration, Evaluate responses to treatment, Monitor vital signs and CBGs as ordered, Perform/monitor CIWA, COWS, AIMS and Fall Risk screenings as ordered, Perform wound care treatments as ordered.  Evaluation of Outcomes: Progressing   LCSW Treatment Plan for Primary Diagnosis: Severe major depression with psychotic features ([place]) Long Term Goal(s): Safe transition to appropriate next level of care at discharge, Engage patient in therapeutic group addressing interpersonal concerns.  Short Term Goals: Engage patient in aftercare planning with referrals and resources, Increase social support, Increase emotional regulation, Facilitate acceptance of mental health diagnosis and concerns, Identify triggers associated with mental health/substance abuse issues and Increase skills for wellness and recovery  Therapeutic Interventions: Assess for all discharge needs, 1 to 1 time with Social worker, Explore available resources and support systems, Assess for adequacy in community support network, Educate family and significant other(s) on suicide prevention, Complete Psychosocial Assessment, Interpersonal group therapy.  Evaluation of Outcomes: Progressing   Progress in Treatment: Attending groups: Yes.  Participating in groups: Yes. Taking medication as prescribed: Yes. Toleration medication: Yes. Family/Significant other contact made: No, will contact:  Pt did not give consents Patient understands diagnosis: Yes. Discussing patient identified problems/goals with staff: Yes. Medical problems stabilized or resolved: Yes. Denies suicidal/homicidal ideation:  Yes. Issues/concerns per patient self-inventory: No.   New problem(s) identified: No, Describe:  None   New Short Term/Long Term Goal(s): medication stabilization, elimination of SI thoughts, development of comprehensive mental wellness plan.   Patient Goals:  "To manage my anxiety and depression better"  Discharge Plan or Barriers: Patient recently admitted. CSW will continue to follow and assess for appropriate referrals and possible discharge planning.   Reason for Continuation of Hospitalization: Medication stabilization Withdrawal symptoms  Estimated Length of Stay: 3 to 5 days   Attendees: Patient: Nathaniel Hansen 11/25/2020   Physician: Bartholomew Crews, MD 11/25/2020   Nursing:  11/25/2020   RN Care Manager: 11/25/2020   Social Worker: Fredirick Lathe, LCSWA 11/25/2020   Recreational Therapist:  11/25/2020   Other:  11/25/2020   Other:  11/25/2020   Other: 11/25/2020     Scribe for Treatment Team: [name], Theresia Majors 11/25/2020 10:54 AM

## 2020-11-25 NOTE — Progress Notes (Addendum)
Pt has been out in open areas. He spent the majority of tonight in the dayroom interacting well with his peers and staff members. He remains eager to attend a rehab center to stay sober upon discharge. He acknowledges that having a therapist set up for after discharge will also be beneficial for him so he can go to someone who he doesn't know when he needs to talk. Pt denies SI/HI and AVH. Active listening, reassurance, and support provided. Medications administered as ordered by MD. Q 15 min safety checks continue. Pt's safety has been maintained.   11/25/20 2112  Psych Admission Type (Psych Patients Only)  Admission Status Involuntary  Psychosocial Assessment  Patient Complaints Substance abuse  Eye Contact Fair  Facial Expression Other (Comment) (appropriate)  Affect Appropriate to circumstance  Speech Logical/coherent  Interaction Assertive  Motor Activity Other (Comment) (WDL)  Appearance/Hygiene Improved  Behavior Characteristics Cooperative;Calm  Mood Pleasant  Thought Process  Coherency WDL  Content WDL  Delusions None reported or observed  Perception WDL  Hallucination None reported or observed  Judgment WDL  Confusion None  Danger to Self  Current suicidal ideation? Denies  Danger to Others  Danger to Others None reported or observed

## 2020-11-25 NOTE — Progress Notes (Signed)
Pt has been out in open areas. He has been interactive with staff and his peers. He also did attend group. He has been pleasant upon approach and shared that he had attended pharmacy group today. He shared the new information he learned about his prescribed medications. Pt denies SI/HI and AVH. Active listening, reassurance, and support provided. Medications administered as ordered by MD. Q 15 min safety checks continue. Pt's safety has been maintained.    11/24/20 2104  Psych Admission Type (Psych Patients Only)  Admission Status Involuntary  Psychosocial Assessment  Patient Complaints None  Eye Contact Fair  Facial Expression Other (Comment) (appropriate)  Affect Appropriate to circumstance  Speech Logical/coherent  Interaction Assertive  Motor Activity Other (Comment) (WDL)  Appearance/Hygiene Poor hygiene  Behavior Characteristics Cooperative;Calm  Mood Pleasant  Thought Process  Coherency WDL  Content WDL  Delusions None reported or observed  Perception WDL  Hallucination None reported or observed  Judgment WDL  Confusion None  Danger to Self  Current suicidal ideation? Denies  Danger to Others  Danger to Others None reported or observed

## 2020-11-25 NOTE — Progress Notes (Signed)
   11/25/20 2112  COVID-19 Daily Checkoff  Have you had a fever (temp > 37.80C/100F)  in the past 24 hours?  No  COVID-19 EXPOSURE  Have you traveled outside the state in the past 14 days? No  Have you been in contact with someone with a confirmed diagnosis of COVID-19 or PUI in the past 14 days without wearing appropriate PPE? No  Have you been living in the same home as a person with confirmed diagnosis of COVID-19 or a PUI (household contact)? No  Have you been diagnosed with COVID-19? No

## 2020-11-25 NOTE — [facility] Group Notes (Signed)
[facility] LCSW Group Therapy  11/25/2020 3:31 PM  Type of Therapy:  The 5 W's and The 5 P's   Participation Level:  Active  Participation Quality:  Appropriate  Affect:  Appropriate  Cognitive:  Appropriate  Insight:  Developing/Improving  Engagement in Therapy:  Developing/Improving  Modes of Intervention:  Activity and Discussion  Summary of Progress/Problems: Nathaniel Hansen states that his goal is to find substance abuse treatment and to stay sober.  Nathaniel Hansen states taht his passion is music and his primary concern is his anxiety.  Nathaniel Hansen plans to work on these issues and goals by changing places that he spend time at and the people he spends time with.   Nathaniel Hansen 11/25/2020, 3:31 PM

## 2020-11-25 NOTE — Progress Notes (Signed)
Recreation Therapy Notes  Date:  1.5.22 Time: 0930 Location: [address] Dayroom  Group Topic: Stress Management  Goal Area(s) Addresses:  Patient will identify positive stress management techniques. Patient will identify benefits of using stress management post d/c.  Behavioral Response: Engaged  Intervention: Stress Management  Activity: Guided Imagery.  LRT read a script to lead patients in envisioning themselves on the beach listening to the peaceful waves.  Patients were to listen and follow along as script read.    Education:  Stress Management, Discharge Planning.   Education Outcome: Acknowledges Education  Clinical Observations/Feedback: Pt attended and participated in activity.    Caroll Rancher, LRT/CTRS         Caroll Rancher A 11/25/2020 11:24 AM

## 2020-11-25 NOTE — Progress Notes (Signed)
The patient's support system will consist of God and his sister. His goal for tomorrow is to try and get into a substance abuse program.

## 2020-11-25 NOTE — Plan of Care (Signed)
Nurse discussed anxiety, depression and coping skills with patient.  

## 2020-11-25 NOTE — Progress Notes (Signed)
[facility] MD Progress Note  11/25/2020 10:47 AM Nathaniel Hansen  MRN:  [id number] Subjective:  This AM patient reports that he sleep continues to improve and he sleeps best with Trazodone. Patient reports that his appetite is coming back and his mood is "pretty good today." Patient reports that he is not feeling affects of withdrawal as he has been without his suboxone 48h. Patient reports that he has been through withdrawal before and he may start to show symptoms about 72h out. Patient reports that he contracts for safety and no HI, nor AVH. Patient reports that is still finding group therapy beneficial.  Principal Problem: Severe major depression with psychotic features ([place]) Diagnosis: Principal Problem:   Severe major depression with psychotic features ([place]) Active Problems:   Opioid dependence ([place])   Stimulant use disorder   Marijuana abuse  Total Time spent with patient: 15 minutes  Past Psychiatric History: See H&P  Past Medical History:  Past Medical History:  Diagnosis Date  . Anxiety   . Asthma   . Bipolar disorder ([place])   . Cocaine abuse ([place])   . Depression   . Endocarditis   . Opioid abuse [facility])     Past Surgical History:  Procedure Laterality Date  . NO PAST SURGERIES     Family History: History reviewed. No pertinent family history. Family Psychiatric  History: See H&P Social History:  Social History   Substance and Sexual Activity  Alcohol Use Yes   Comment: last use was 6 months ago, drinks 1 beer once a month     Social History   Substance and Sexual Activity  Drug Use Yes  . Types: Marijuana, Heroin, Cocaine   Comment: lat use of occaine & heroine was 1 week ago of 0.5 grams, uses "every couple of days"    Social History   Socioeconomic History  . Marital status: Single    Spouse name: Not on file  . Number of children: Not on file  . Years of education: Not on file  . Highest education level: Not on file  Occupational History  . Not on file   Tobacco Use  . Smoking status: Current Every Day Smoker    Packs/day: 1.00    Types: Cigarettes  . Smokeless tobacco: Never Used  Vaping Use  . Vaping Use: Never used  Substance and Sexual Activity  . Alcohol use: Yes    Comment: last use was 6 months ago, drinks 1 beer once a month  . Drug use: Yes    Types: Marijuana, Heroin, Cocaine    Comment: lat use of occaine & heroine was 1 week ago of 0.5 grams, uses "every couple of days"  . Sexual activity: Yes  Other Topics Concern  . Not on file  Social History Narrative  . Not on file   Social Determinants of Health   Financial Resource Strain: Not on file  Food Insecurity: Not on file  Transportation Needs: Not on file  Physical Activity: Not on file  Stress: Not on file  Social Connections: Not on file   Additional Social History:                         Sleep: Good  Appetite:  Good  Current Medications: Current Facility-Administered Medications  Medication Dose Route Frequency Provider Last Rate Last Admin  . acetaminophen (TYLENOL) tablet 650 mg  650 mg Oral Q6H PRN Connye Burkitt, NP      .  alum & mag hydroxide-simeth (MAALOX/MYLANTA) 200-200-20 MG/5ML suspension 30 mL  30 mL Oral Q4H PRN Aldean Baker, NP      . citalopram (CELEXA) tablet 20 mg  20 mg Oral Daily Antonieta Pert, MD   20 mg at 11/25/20 3419  . dicyclomine (BENTYL) tablet 20 mg  20 mg Oral Q6H PRN Eliseo Gum B, MD      . hydrOXYzine (ATARAX/VISTARIL) tablet 25 mg  25 mg Oral Q6H PRN Aldean Baker, NP   25 mg at 11/24/20 2104  . loperamide (IMODIUM) capsule 2-4 mg  2-4 mg Oral PRN Eliseo Gum B, MD      . magnesium hydroxide (MILK OF MAGNESIA) suspension 30 mL  30 mL Oral Daily PRN Aldean Baker, NP      . methocarbamol (ROBAXIN) tablet 500 mg  500 mg Oral Q8H PRN Eliseo Gum B, MD   500 mg at 11/25/20 3790  . naproxen (NAPROSYN) tablet 500 mg  500 mg Oral BID PRN Eliseo Gum B, MD   500 mg at 11/25/20 0815  . nicotine  polacrilex (NICORETTE) gum 2 mg  2 mg Oral PRN Antonieta Pert, MD      . OLANZapine [facility]) tablet 7.5 mg  7.5 mg Oral QHS Antonieta Pert, MD   7.5 mg at 11/24/20 2104  . ondansetron (ZOFRAN-ODT) disintegrating tablet 4 mg  4 mg Oral Q6H PRN Eliseo Gum B, MD      . traZODone (DESYREL) tablet 50 mg  50 mg Oral QHS PRN Aldean Baker, NP   50 mg at 11/24/20 2104    Lab Results: No results found for this or any previous visit (from the past 48 hour(s)).  Blood Alcohol level:  Lab Results  Component Value Date   ETH <10 11/17/2020   ETH <10 01/25/2020    Metabolic Disorder Labs: No results found for: HGBA1C, MPG No results found for: PROLACTIN No results found for: CHOL, TRIG, HDL, CHOLHDL, VLDL, LDLCALC  Physical Findings: AIMS: Facial and Oral Movements Muscles of Facial Expression: None, normal Lips and Perioral Area: None, normal Jaw: None, normal Tongue: None, normal,Extremity Movements Upper (arms, wrists, hands, fingers): None, normal Lower (legs, knees, ankles, toes): None, normal, Trunk Movements Neck, shoulders, hips: None, normal, Overall Severity Severity of abnormal movements (highest score from questions above): None, normal Incapacitation due to abnormal movements: None, normal Patient's awareness of abnormal movements (rate only patient's report): No Awareness, Dental Status Current problems with teeth and/or dentures?: No Does patient usually wear dentures?: No  CIWA:  CIWA-Ar Total: 0 COWS:  COWS Total Score: 2  Musculoskeletal: Strength & Muscle Tone: within normal limits Gait & Station: normal Patient leans: N/A  Psychiatric Specialty Exam: Physical Exam Constitutional:      Appearance: Normal appearance.  HENT:     Head: Normocephalic and atraumatic.  Pulmonary:     Effort: Pulmonary effort is normal.  Neurological:     Mental Status: He is alert.     Review of Systems  Cardiovascular: Negative for chest pain.  Musculoskeletal:  Positive for myalgias.  Neurological: Negative for dizziness.    Blood pressure 118/79, pulse 78, temperature 98.1 F (36.7 C), temperature source Oral, resp. rate 20, height 5\' 11"  (1.803 m), weight 79.4 kg, SpO2 100 %.Body mass index is 24.41 kg/m.  General Appearance: Casual  Eye Contact:  Good  Speech:  Clear and Coherent  Volume:  Normal  Mood:  Euthymic  Affect:  Congruent  Thought Process:  Coherent  Orientation:  Full (Time, Place, and Person)  Thought Content:  Logical  Suicidal Thoughts:  No  Homicidal Thoughts:  No  Memory:  Recent;   Good  Judgement:  Fair  Insight:  Improving  Psychomotor Activity:  Normal  Concentration:  Concentration: Good  Recall:  NA  Fund of Knowledge:  Good  Language:  Good  Akathisia:  No  Handed:  Right  AIMS (if indicated):     Assets:  Communication Skills Desire for Improvement Leisure Time  ADL's:  Intact  Cognition:  WNL  Sleep:  Number of Hours: 6.5     Treatment Plan Summary: Daily contact with patient to assess and evaluate symptoms and progress in treatment  Nathaniel Hansen is a 35 yo patient w/ dx of severe recurrent major depression w/ psychotic features (r/o substance induced psychotic d/o or substance induced depressive d/o).  Patient's mood continues to improve and patient remains focused on his sobriety. At this time patient is 48h out from his suboxone being stopped. Patient reports that he will normally start to go through withdrawal symptoms 72h out. Will continue to monitor patient. Patient was reporting some mild symptoms of running nose, but is up on the unit. Patient was able to secure housing at his residential program with a bed for Friday.  WKG noted QTc of 406. Severe recurrent major depression w/ psychotic features - Celexa 20mg  - Zyprexa 7.5mg  - TSH for monitoring in context of depressive symptoms - Lipid Panel, HgbA1c,  pending for monitoring with use of an antipsychotic  Opiate use d/o Stimulant use  d/o - amphetamine type Cannabis use d/o  - Withdrawal Protocol in place for monitoring with discontinuation of suboxone - SW referring to residential rehab programs  Hypokalemia and Hyponatremia on admission - BMP pending for monitoring  PRN -Bentyl 20mg  q6h - loperamide 2-4mg  - Robaxin 500mg  q8h - Naproxen 500mg  BID - Zofran 4mg  q6h -Tylenol 650mg  q6h, pain -Maalox 53ml q4h, indigestion -Atarax 25mg  TID, anxiety -Milk of Mag 51mL, constiaption -Trazodone 50mg  QHS, insomnia PGY-1  , MD 11/25/2020, 10:47 AM

## 2020-11-26 MED ORDER — WHITE PETROLATUM EX OINT
TOPICAL_OINTMENT | CUTANEOUS | Status: AC
  Filled 2020-11-26: qty 5

## 2020-11-26 NOTE — Plan of Care (Signed)
Nurse discussed anxiety, depression and coping skills with patient.  

## 2020-11-26 NOTE — Progress Notes (Signed)
D:  Patient's self inventory sheet, patient sleeps good, sleep medication helpful.  Good appetite, normal energy level, good concentration.  Rated depression, hopeless and anxiety 2.  Withdrawals, tremors, chilling, cravings.  Denied SI.  Denied physical problems.  Physical pain, back, legs, worst pain #4 in past 24 hours.  Goal is attend groups, take something out of [facility] before I leave.  Plans to attend groups.  Does have discharge plans. A:  Medications administered per MD orders.  Emotional support and encouragement given patient. R:  Denied SI and HI, contracts for safety.  Denied A/V hallucinations.  Safety maintained with 15 minute checks.

## 2020-11-26 NOTE — Progress Notes (Signed)
The item that the patient would like to change following discharge is to "stop using" and "stay sober". He would also like to spend more time with his son and build a stronger relationship with him since he was incarcerated for much of the child's life.

## 2020-11-26 NOTE — Progress Notes (Signed)
[facility] MD Progress Note  11/26/2020 1:01 PM Nathaniel Hansen  MRN:  [id number] Subjective:  This AM patient reports that he slept well but he woke up feeling very anxious with body aches and headaches. Patient reports "I just feel icky."Patient reported that he did feel that he may have been going through withdrawal but would try to leave his room today. Patient reports that he did refuse his labs. He reports that he refused labs because he did not like the feeling of a transfusion. Despite explaining to the patient that this was different he reported that he understood this would not be the same but he does not like having his blood drawn. Writer explained that the care team wanted to evaluate his K level, Lipids, and A1c however patient reported that he did not feel it was necessary and would on get labs if he felt worse. Patient contracted for safety and did not endorse HI nor AVH today.  Principal Problem: Severe major depression with psychotic features ([facility]) Diagnosis: Principal Problem:   Severe major depression with psychotic features ([facility]) Active Problems:   Opioid dependence ([facility])   Stimulant use disorder   Marijuana abuse  Total Time spent with patient: 20 minutes  Past Psychiatric History: See H&P  Past Medical History:  Past Medical History:  Diagnosis Date  . Anxiety   . Asthma   . Bipolar disorder ([facility])   . Cocaine abuse ([facility])   . Depression   . Endocarditis   . Opioid abuse [facility])     Past Surgical History:  Procedure Laterality Date  . NO PAST SURGERIES     Family History: History reviewed. No pertinent family history. Family Psychiatric  History: See H&P Social History:  Social History   Substance and Sexual Activity  Alcohol Use Yes   Comment: last use was 6 months ago, drinks 1 beer once a month     Social History   Substance and Sexual Activity  Drug Use Yes  . Types: Marijuana, Heroin, Cocaine   Comment: lat use of occaine & heroine was 1 week ago of  0.5 grams, uses "every couple of days"    Social History   Socioeconomic History  . Marital status: Single    Spouse name: Not on file  . Number of children: Not on file  . Years of education: Not on file  . Highest education level: Not on file  Occupational History  . Not on file  Tobacco Use  . Smoking status: Current Every Day Smoker    Packs/day: 1.00    Types: Cigarettes  . Smokeless tobacco: Never Used  Vaping Use  . Vaping Use: Never used  Substance and Sexual Activity  . Alcohol use: Yes    Comment: last use was 6 months ago, drinks 1 beer once a month  . Drug use: Yes    Types: Marijuana, Heroin, Cocaine    Comment: lat use of occaine & heroine was 1 week ago of 0.5 grams, uses "every couple of days"  . Sexual activity: Yes  Other Topics Concern  . Not on file  Social History Narrative  . Not on file   Social Determinants of Health   Financial Resource Strain: Not on file  Food Insecurity: Not on file  Transportation Needs: Not on file  Physical Activity: Not on file  Stress: Not on file  Social Connections: Not on file   Additional Social History:  Sleep: Fair  Appetite:  Good  Current Medications: Current Facility-Administered Medications  Medication Dose Route Frequency Provider Last Rate Last Admin  . acetaminophen (TYLENOL) tablet 650 mg  650 mg Oral Q6H PRN Aldean Baker, NP      . alum & mag hydroxide-simeth (MAALOX/MYLANTA) 200-200-20 MG/5ML suspension 30 mL  30 mL Oral Q4H PRN Aldean Baker, NP      . citalopram (CELEXA) tablet 20 mg  20 mg Oral Daily Antonieta Pert, MD   20 mg at 11/26/20 0827  . dicyclomine (BENTYL) tablet 20 mg  20 mg Oral Q6H PRN Eliseo Gum B, MD      . hydrOXYzine (ATARAX/VISTARIL) tablet 25 mg  25 mg Oral Q6H PRN Aldean Baker, NP   25 mg at 11/25/20 2112  . loperamide (IMODIUM) capsule 2-4 mg  2-4 mg Oral PRN Eliseo Gum B, MD      . magnesium hydroxide (MILK OF MAGNESIA)  suspension 30 mL  30 mL Oral Daily PRN Aldean Baker, NP      . methocarbamol (ROBAXIN) tablet 500 mg  500 mg Oral Q8H PRN Eliseo Gum B, MD   500 mg at 11/25/20 5093  . naproxen (NAPROSYN) tablet 500 mg  500 mg Oral BID PRN Eliseo Gum B, MD   500 mg at 11/25/20 0815  . nicotine polacrilex (NICORETTE) gum 2 mg  2 mg Oral PRN Antonieta Pert, MD      . OLANZapine [facility]) tablet 7.5 mg  7.5 mg Oral QHS Antonieta Pert, MD   7.5 mg at 11/25/20 2112  . ondansetron (ZOFRAN-ODT) disintegrating tablet 4 mg  4 mg Oral Q6H PRN Eliseo Gum B, MD      . traZODone (DESYREL) tablet 50 mg  50 mg Oral QHS PRN Aldean Baker, NP   50 mg at 11/25/20 2112    Lab Results: No results found for this or any previous visit (from the past 48 hour(s)).  Blood Alcohol level:  Lab Results  Component Value Date   ETH <10 11/17/2020   ETH <10 01/25/2020    Metabolic Disorder Labs: No results found for: HGBA1C, MPG No results found for: PROLACTIN No results found for: CHOL, TRIG, HDL, CHOLHDL, VLDL, LDLCALC  Physical Findings: AIMS: Facial and Oral Movements Muscles of Facial Expression: None, normal Lips and Perioral Area: None, normal Jaw: None, normal Tongue: None, normal,Extremity Movements Upper (arms, wrists, hands, fingers): None, normal Lower (legs, knees, ankles, toes): None, normal, Trunk Movements Neck, shoulders, hips: None, normal, Overall Severity Severity of abnormal movements (highest score from questions above): None, normal Incapacitation due to abnormal movements: None, normal Patient's awareness of abnormal movements (rate only patient's report): No Awareness, Dental Status Current problems with teeth and/or dentures?: No Does patient usually wear dentures?: No  CIWA:  CIWA-Ar Total: 0 COWS:  COWS Total Score: 1  Musculoskeletal: Strength & Muscle Tone: within normal limits Gait & Station: normal Patient leans: N/A  Psychiatric Specialty Exam: Physical  Exam Constitutional:      Appearance: Normal appearance.     Comments: AIMS: 0  HENT:     Head: Normocephalic and atraumatic.  Pulmonary:     Effort: Pulmonary effort is normal.  Neurological:     Mental Status: He is alert.     Review of Systems  Musculoskeletal: Positive for myalgias.  Neurological: Positive for headaches.  Psychiatric/Behavioral: The patient is nervous/anxious.     Blood pressure 108/75, pulse 86, temperature 98.2 F (36.8  C), temperature source Oral, resp. rate 20, height 5\' 11"  (1.803 m), weight 79.4 kg, SpO2 100 %.Body mass index is 24.41 kg/m.  General Appearance: Casual  Eye Contact:  Fair  Speech:  Clear and Coherent  Volume:  Normal  Mood:  Irritable  Affect:  Appropriate  Thought Process:  Coherent  Orientation:  NA  Thought Content:  Logical  Suicidal Thoughts:  No  Homicidal Thoughts:  No  Memory:  Recent;   Good  Judgement:  Fair  Insight:  Improving  Psychomotor Activity:  Normal  Concentration:  Concentration: Good  Recall:  NA  Fund of Knowledge:  Good  Language:  Good  Akathisia:  No  Handed:  Right  AIMS (if indicated):     Assets:  Desire for Improvement  ADL's:  Intact  Cognition:  WNL  Sleep:  Number of Hours: 6.5     Treatment Plan Summary: Daily contact with patient to assess and evaluate symptoms and progress in treatment  Mr. Cecchi is a 35 yo patient w/ dx of severe recurrent major depression w/ psychotic features(r/o substance induced psychotic d/o or substance induced depressive d/o). Patient's mood continues to improve and patient remains focused on his sobriety. Patient did appear more irritable this AM and was reporting some full body myalgias. However, patient did report that he would attempt to leave his room today and interact on the unit. Patient continues to refuse monitoring of his lipids, A1c, TSH , and BMP. Patient did have non concerning EKG decreasing concern that his K continued a downward trend. AIMS  was 0. Patient does appear competent to make his own medical decisions will therefore respect his wishes to have no labs. The negative effects of no monitoring were expressed to the patient and he continued to report he would not have labs drawn. Severe recurrent major depression w/ psychotic features - Celexa 20mg  - Zyprexa 7.5mg   Opiate use d/o Stimulant use d/o - amphetamine type Cannabis use d/o - Withdrawal Protocol in placefor monitoring with discontinuation of suboxone - SW referring to residential rehab programs  Hypokalemiaand Hyponatremiaon admission -BMPpending for monitoring  PRN -Bentyl 20mg  q6h - loperamide 2-4mg  - Robaxin 500mg  q8h - Naproxen 500mg  BID - Zofran 4mg  q6h -Tylenol 650mg  q6h, pain -Maalox 84ml q4h, indigestion -Atarax 25mg  TID, anxiety -Milk of Mag 66mL, constiaption -Trazodone 50mg  QHS, insomnia PGY-1 Freida Busman, MD 11/26/2020, 1:01 PM

## 2020-11-26 NOTE — [facility] Group Notes (Signed)
Occupational Therapy Group Note Date: 11/26/2020 Group Topic/Focus: Communication Skills  Group Description: Group encouraged increased engagement and participation through discussion focused on communication styles. Patients were educated on the different styles of communication including passive, aggressive, assertive, and passive-aggressive communication. Group members shared and reflected on which styles they most often find themselves communicating in and brainstormed strategies on how to transition and practice a more assertive approach. Further discussion explored how to use assertiveness skills and strategies to further advocate and ask questions as it relates to their treatment plan and mental health.   Therapeutic Goal(s): Identify practical strategies to improve communication skills  Identify how to use assertive communication skills to address individual needs and wants Participation Level: Active   Participation Quality: Independent   Behavior: Calm, Cooperative and Interactive   Speech/Thought Process: Focused   Affect/Mood: Anxious and Full range   Insight: Fair   Judgement: Fair   Individualization: Sted was active in their participation of discussion and appeared receptive to identifying differences between passive, aggressive, and assertive communication styles. Pt nodded along in agreement of peers when sharing their style changes often based upon their emotional state and the person they are engaging with. Appeared receptive to learning additional strategies to practice more being more assertive.   Modes of Intervention: Activity, Discussion, Education and Role-play  Patient Response to Interventions:  Attentive, Engaged, Receptive and Interested   Plan: Continue to engage patient in OT groups 2 - 3x/week.  11/26/2020  Donne Hazel, MOT, OTR/L

## 2020-11-26 NOTE — Progress Notes (Signed)
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

## 2020-11-27 MED ORDER — HYDROXYZINE HCL 25 MG PO TABS: 25.0000 mg | ORAL_TABLET | Freq: Four times a day (QID) | ORAL | 0 refills | Status: AC | PRN

## 2020-11-27 MED ORDER — OLANZAPINE 7.5 MG PO TABS: 7.5000 mg | ORAL_TABLET | Freq: Every day | ORAL | 0 refills | Status: AC

## 2020-11-27 MED ORDER — TRAZODONE HCL 50 MG PO TABS: 50.0000 mg | ORAL_TABLET | Freq: Every day | ORAL | 0 refills | Status: AC

## 2020-11-27 MED ORDER — CITALOPRAM HYDROBROMIDE 20 MG PO TABS: 20.0000 mg | ORAL_TABLET | Freq: Every day | ORAL | 0 refills | Status: AC

## 2020-11-27 NOTE — Progress Notes (Signed)
Pt discharged to lobby. Pt was stable and appreciative at that time. All papers, samples and prescriptions were given and valuables returned. Verbal understanding expressed. Denies SI/HI and A/VH. Pt given opportunity to express concerns and ask questions.  

## 2020-11-27 NOTE — Progress Notes (Signed)
  [facility] Adult Case Management Discharge Plan :  Will you be returning to the same living situation after discharge:  No. [facility]  At discharge, do you have transportation home?: Yes,  [facility] Do you have the ability to pay for your medications: No.  Release of information consent forms completed and in the chart;  Patient's signature needed at discharge.  Patient to Follow up at:  Follow-up Information    [facility] Follow up.   Why: Call for a bed at discharge  Contact information: [facility] [address] [place], [place] [address]  Phone: [phone]       [facility]. Go on 11/25/2020.   Why: You  have a hospital discharge appointment on 11/25/20 at 10:00 am  for therapy and medication management services.  This appointment will be held in person.  Please bring your photo ID and proof of any income. Contact information: [address] [place] [address] [phone]        [facility] Follow up.   Why: Please continue with this provider for methadone treatment services. Contact information: [address] [place], [place] [address]  P: [phone]              Next level of care provider has access to [facility] Link:no  Safety Planning and Suicide Prevention discussed: Yes,  with patient  Have you used any form of tobacco in the last 30 days? (Cigarettes, Smokeless Tobacco, Cigars, and/or Pipes): Yes  Has patient been referred to the Quitline?: Patient refused referral  Patient has been referred for addiction treatment: Yes  Aram Beecham, LCSWA 11/27/2020, 8:32 AM

## 2020-11-27 NOTE — Discharge Summary (Signed)
Physician Discharge Summary Note  Patient:  Nathaniel Hansen is an 35 y.o., male MRN:  [id number] DOB:  Aug 04, 1986 Patient phone:  [phone] (home)  Patient address:   [address] [place] [facility] [address],  Total Time spent with patient: 20 minutes  Date of Admission:  11/19/2020 Date of Discharge: 11/28/2019  Reason for Admission:  Patient is seen and examined. Patient is a 35 year old male who originally presented to the [facility] emergency department on 11/17/2020 complaining of panic attacks and stating that he felt hopeless. He had been previously treated with psychiatric medications and needed a refill. He also told them that he wanted to kill himself and would do a by overdose. He told the admitting physician that he was residing at a recovery facility in [place] and recently moved in with his mother. He reported having taken hydroxyzine, Zyprexa. He stated he ran out of his Zyprexa approximately 1 to 2 weeks prior to admission. He stated he was also taking Suboxone from a clinic locally. He reported [facility] 2 months ago. He left the program early due to feeling better. He stated today that he wants to return to a substance rehabilitation program. He is vague on how long he remained sober after leaving the facility. He admitted to having used cocaine, pain pills and skipping his Suboxone. He stated he did well on Zyprexa, Celexa and Vistaril. In September of this year [facility]. He had reported anxiety and paranoia. His drug screen was positive for amphetamines, benzodiazepines, cannabinoids and had been placed under involuntary commitment. The psychiatric service there adjusted his medications to include Zoloft and Zyprexa. His mood was stabilized. His involuntary commitment was stopped, and he was  provided with outpatient resources for follow-up. He was referred to [facility] and has an appointment with [facility] on 9/27. [facility]. Since then he has been living with his mother. He states he is going to return to her home afterwards, but does want to go to a rehabilitation facility. His last hospitalization to our system was at [facility] in March of this year. He was there from 01/27/2020 to 02/03/2020. His diagnosis at that time was opiate dependence, polysubstance abuse, cocaine abuse and depression. He was discharged on buprenorphine/naloxone, citalopram, hydroxyzine and trazodone. He has multiple emergency room visits for medication refills, substance abuse as well as opiate use disorder. He was admitted to the hospital for evaluation and stabilization.  Principal Problem: Severe major depression with psychotic features [facility]) Discharge Diagnoses: Principal Problem:   Severe major depression with psychotic features (HCC) Active Problems:   Opioid dependence (HCC)   Stimulant use disorder   Marijuana abuse   Past Psychiatric History: Per above.   Past Medical History:  Past Medical History:  Diagnosis Date  . Anxiety   . Asthma   . Bipolar disorder (HCC)   . Cocaine abuse (HCC)   . Depression   . Endocarditis   . Opioid abuse [facility])     Past Surgical History:  Procedure Laterality Date  . NO PAST SURGERIES     Family History: History reviewed. No pertinent family history. Family Psychiatric  History: None known Social History:  Social History   Substance and Sexual Activity  Alcohol Use Yes   Comment: last use was 6  months ago, drinks 1 beer once a month     Social History   Substance and Sexual Activity  Drug Use Yes  . Types: Marijuana, Heroin, Cocaine   Comment: lat use of occaine & heroine was 1 week ago of 0.5 grams, uses "every couple of days"    Social History   Socioeconomic History  . Marital  status: Single    Spouse name: Not on file  . Number of children: Not on file  . Years of education: Not on file  . Highest education level: Not on file  Occupational History  . Not on file  Tobacco Use  . Smoking status: Current Every Day Smoker    Packs/day: 1.00    Types: Cigarettes  . Smokeless tobacco: Never Used  Vaping Use  . Vaping Use: Never used  Substance and Sexual Activity  . Alcohol use: Yes    Comment: last use was 6 months ago, drinks 1 beer once a month  . Drug use: Yes    Types: Marijuana, Heroin, Cocaine    Comment: lat use of occaine & heroine was 1 week ago of 0.5 grams, uses "every couple of days"  . Sexual activity: Yes  Other Topics Concern  . Not on file  Social History Narrative  . Not on file   Social Determinants of Health   Financial Resource Strain: Not on file  Food Insecurity: Not on file  Transportation Needs: Not on file  Physical Activity: Not on file  Stress: Not on file  Social Connections: Not on file    Hospital Course:  Patient was admitted after expressing SI and endorsed that he had been hearing voices recently. Despite patient's substance use patient did appear to endorse symptoms of depression outside of the context of heroin his use.  Patient was restarted on Celexa as he reported previous success on the medication in the past and was also started on Zyprexa to address his AH. Patient reported that he felt much better on the medication and was no longer endorsing AH. Early in hospitalization patient noted that he was having some irritability and general HI towards people he had seen in his dreams however patient was able to work through his emotions and remain stable. Patient gradually began to attend groups more frequently and became less isolative. Towards the end of hospitalization patient was very social. Patient's stay was prolonged by his motivation to regain his sobriety. Patient reported that he was willing to discontinue  suboxone in order to have more options for inpatient rehab. Patient had been started on subxone inpatient under the context that he had been receiving it outpatient. It was later clarified that patient had actually been on Subutex and he had been skipping some days to use heroin. Patient Ethelene Hal was discontinued and patient had mild withdrawal symptoms but still attempted to be interactive on the unit. Patient eventually decided to return to a halfway house program he felt was helpful the first time he was able to become sober for 1 year. Patient was accepted into the program and was discharged denying SI, HI and AVH. Patient was discharged with treatment for MDD w/ psychotic features: Celexa 20mg  and Zyprexa 7.5mg  QHS as well as  Trazodone 50mg  for sleep and Atarax 25mg  TID PRN (patient had never required more than 25mg  /day). Patient was future oriented and focused on his sobriety at discharge. Of note: Depsite multiple attempts patient refused to have his blood drawn. Multiple attempts were made to get  his Lipid panel, TSH, and A1c. Patient was made aware why these labs were requested and the negative side effects of Zyprexa. Patient EKG Qtc was non concerning. Patient also refused to BMP as there was concern for hypokalemia noted at presentation, but patient refused. Patient also denied tobacco cessation assistant at discharge saying that he was not ready to quit.  Physical Findings: AIMS: Facial and Oral Movements Muscles of Facial Expression: None, normal Lips and Perioral Area: None, normal Jaw: None, normal Tongue: None, normal,Extremity Movements Upper (arms, wrists, hands, fingers): None, normal Lower (legs, knees, ankles, toes): None, normal, Trunk Movements Neck, shoulders, hips: None, normal, Overall Severity Severity of abnormal movements (highest score from questions above): None, normal Incapacitation due to abnormal movements: None, normal Patient's awareness of abnormal movements  (rate only patient's report): No Awareness, Dental Status Current problems with teeth and/or dentures?: No Does patient usually wear dentures?: No  CIWA:  CIWA-Ar Total: 0 COWS:  COWS Total Score: 1  Musculoskeletal: Strength & Muscle Tone: within normal limits Gait & Station: normal Patient leans: N/A  Psychiatric Specialty Exam: Physical Exam Constitutional:      Appearance: Normal appearance.  HENT:     Head: Normocephalic and atraumatic.  Pulmonary:     Effort: Pulmonary effort is normal.  Neurological:     Mental Status: He is alert.     Review of Systems  Cardiovascular: Negative for chest pain.  Gastrointestinal: Negative for abdominal pain.  Neurological: Negative for dizziness.    Blood pressure 115/86, pulse 76, temperature 98.1 F (36.7 C), temperature source Oral, resp. rate 18, height 5\' 11"  (1.803 m), weight 79.4 kg, SpO2 100 %.Body mass index is 24.41 kg/m.  General Appearance: Fairly Groomed  Eye Contact:  Good  Speech:  Clear and Coherent  Volume:  Normal  Mood:  feeling good, a little nervous but exicted and ready to get to the next step!"  Affect:  Congruent  Thought Process:  Coherent  Orientation:  Full (Time, Place, and Person)  Thought Content:  Logical  Suicidal Thoughts:  No  Homicidal Thoughts:  No  Memory:  Recent;   Good  Judgement:  Fair  Insight:  Fair  Psychomotor Activity:  Normal  Concentration:  Concentration: Good  Recall:  NA  Fund of Knowledge:  Good  Language:  Good  Akathisia:  No  Handed:  Right  AIMS (if indicated):     Assets:  Communication Skills Desire for Improvement Leisure Time Resilience  ADL's:  Intact  Cognition:  WNL  Sleep:  Number of Hours: 6.25     Have you used any form of tobacco in the last 30 days? (Cigarettes, Smokeless Tobacco, Cigars, and/or Pipes): Yes    Blood Alcohol level:  Lab Results  Component Value Date   ETH <10 11/17/2020   ETH <10 01/25/2020    Metabolic Disorder Labs:   No results found for: HGBA1C, MPG No results found for: PROLACTIN No results found for: CHOL, TRIG, HDL, CHOLHDL, VLDL, LDLCALC  See Psychiatric Specialty Exam and Suicide Risk Assessment completed by Attending Physician prior to discharge.  Discharge destination:  Other:  Halfway house  Is patient on multiple antipsychotic therapies at discharge:  No   Has Patient had three or more failed trials of antipsychotic monotherapy by history:  No  Recommended Plan for Multiple Antipsychotic Therapies: NA   Allergies as of 11/27/2020   No Known Allergies     Medication List    STOP taking these medications  buprenorphine 8 MG Subl SL tablet Commonly known as: SUBUTEX   buprenorphine-naloxone 2-0.5 mg Subl SL tablet Commonly known as: SUBOXONE   buprenorphine-naloxone 8-2 mg Subl SL tablet Commonly known as: SUBOXONE   GOODYS BODY PAIN PO     TAKE these medications     Indication  citalopram 20 MG tablet Commonly known as: CeleXA Take 1 tablet (20 mg total) by mouth daily.  Indication: Depression   hydrOXYzine 25 MG tablet Commonly known as: ATARAX/VISTARIL Take 1 tablet (25 mg total) by mouth every 6 (six) hours as needed for anxiety. What changed: when to take this  Indication: Feeling Anxious   OLANZapine 7.5 MG tablet Commonly known as: ZYPREXA Take 1 tablet (7.5 mg total) by mouth at bedtime. What changed:   medication strength  how much to take  when to take this  Indication: Psychotic Depressive Illness   traZODone 50 MG tablet Commonly known as: DESYREL Take 1 tablet (50 mg total) by mouth at bedtime. What changed:   when to take this  reasons to take this  Indication: Trouble Sleeping       Follow-up Information    [facility]. Go on 11/25/2020.   Why: You  have a hospital discharge appointment on 11/25/20 at 10:00 am  for therapy and medication management services.  This appointment will be held in person.  Please bring your photo  ID and proof of any income. Contact information: [place] [address] [phone]        [facility] Follow up.   Why: Please continue with this provider for methadone treatment services. Contact information: [address] [place], [place] [address]  P: [facility] Follow up on 11/27/2020.   Why: Please go to this provider today.  Contact information: [place], [place] [address]   [phone]              Follow-up recommendations:  Follow up recommendations: - Activity as tolerated. - Diet as recommended by PCP. - Keep all scheduled follow-up appointments as recommended.   Comments:  Patient is instructed to take all prescribed medications as recommended. Report any side effects or adverse reactions to your outpatient psychiatrist. Patient is instructed to abstain from alcohol and illegal drugs while on prescription medications. In the event of worsening symptoms, patient is instructed to call the crisis hotline, 911, or go to the nearest emergency department for evaluation and treatment.    Signed: PGY-1 Freida Busman, MD 11/27/2020, 6:56 PM

## 2020-11-27 NOTE — [facility]HH Suicide Risk Assessment (Signed)
[facility] Discharge Suicide Risk Assessment   Principal Problem: Severe major depression with psychotic features [facility]) Discharge Diagnoses: Principal Problem:   Severe major depression with psychotic features (HCC) Active Problems:   Opioid dependence (HCC)   Stimulant use disorder   Marijuana abuse   Total Time Spent in Direct Patient Care:  I personally spent 25 minutes on the unit in direct patient care. The direct patient care time included face-to-face time with the patient, reviewing the patient's chart, communicating with other professionals, and coordinating care. Greater than 50% of this time was spent in counseling or coordinating care with the patient regarding goals of hospitalization, psycho-education, and discharge planning needs.  Musculoskeletal: Strength & Muscle Tone: within normal limits Gait & Station: normal, steady Patient leans: N/A  Psychiatric Specialty Exam: Review of Systems  Respiratory: Negative for shortness of breath.   Cardiovascular: Positive for chest pain.  Gastrointestinal: Negative for nausea and vomiting.    Blood pressure 115/86, pulse 76, temperature 98.1 F (36.7 C), temperature source Oral, resp. rate 18, height 5\' 11"  (1.803 m), weight 79.4 kg, SpO2 100 %.Body mass index is 24.41 kg/m.  General Appearance: casually dressed, adequate hygiene, appears stated age  Eye Contact::  Good  Speech:  Normal Rate and fluency  Volume:  Normal  Mood:  Euthymic  Affect:  moderate, stable  Thought Process:  Goal Directed and Linear  Orientation:  Full (Time, Place, and Person)  Thought Content:  Denies AVH, ideas of reference, first rank symptoms, or paranoia; no acute psychosis or delusions noted  Suicidal Thoughts:  No  Homicidal Thoughts:  No  Memory:  Recent;   Fair  Judgement:  Fair  Insight:  Fair  Psychomotor Activity:  Normal  Concentration:  Fair  Recall:  [id number] of Knowledge:Fair  Language: Good  Akathisia:  Negative  Assets:   Communication Skills Desire for Improvement Resilience  Sleep:  Number of Hours: 6.25  Cognition: WNL  ADL's:  Intact   Mental Status Per Nursing Assessment::   On Admission:  Suicide plan, AH  Demographic Factors:  Male, Low socioeconomic status and Unemployed  Loss Factors: Decrease in vocational status and Financial problems/change in socioeconomic status  Historical Factors: Prior suicide attempts  Risk Reduction Factors:   Sense of responsibility to family and Previous response to treatment; current medication compliance  Continued Clinical Symptoms:  Depression:   Impulsivity Alcohol/Substance Abuse/Dependencies More than one psychiatric diagnosis Previous Psychiatric Diagnoses and Treatments  Cognitive Features That Contribute To Risk:  Closed-mindedness    Suicide Risk:  Mild:  Suicidal ideation of limited frequency, intensity, duration, and specificity.  There are no identifiable plans, no associated intent, mild dysphoria and related symptoms, good self-control (both objective and subjective assessment), few other risk factors, and identifiable protective factors, including available and accessible social support.   Follow-up Information    [facility] Go on 11/25/2020.   Why: You  have a hospital discharge appointment on 11/25/20 at 10:00 am  for therapy and medication management services.  This appointment will be held in person.  Please bring your photo ID and proof of any income. Contact information: [address] [place] [phone]        [facility] Follow up.   Why: Please continue with this provider for methadone treatment services. Contact information: [address] [place], [place]  P: [phone]       [facility] Follow up on 11/27/2020.   Why: Please  go to this provider today.  Contact information: [address] [place], [place] [address]   [phone]              Plan  Of Care/Follow-up recommendations:  Activity:  as tolerated Diet:  regular Patient referred to local half-way house at discharge with plans for outpatient substance abuse and mental health treatment as noted above. Patient will need ongoing outpatient metabolic lab, EKG, CBC, AIMS, and weight monitoring while on an antipsychotic.   Comer Locket, MD, FAPA 11/27/2020, 9:25 AM

## 2020-11-27 NOTE — Progress Notes (Signed)
   11/26/20 2100  Psych Admission Type (Psych Patients Only)  Admission Status Involuntary  Psychosocial Assessment  Patient Complaints Anxiety;Insomnia  Eye Contact Fair  Facial Expression Other (Comment) (appropriate)  Affect Appropriate to circumstance  Speech Logical/coherent  Interaction Assertive  Motor Activity Other (Comment) (WDL)  Appearance/Hygiene Improved  Behavior Characteristics Cooperative  Mood Anxious  Thought Process  Coherency WDL  Content WDL  Delusions None reported or observed  Perception WDL  Hallucination None reported or observed  Judgment WDL  Confusion None  Danger to Self  Current suicidal ideation? Denies  Danger to Others  Danger to Others None reported or observed

## 2021-07-30 IMAGING — MR MR CERVICAL SPINE WO/W CM
4 of 9 series · 18 of 48 positions shown · IV contrast (gadavist)
Comparison: None.

CLINICAL DATA: Septic shock, neck pain

EXAM:
MRI CERVICAL SPINE WITHOUT AND WITH CONTRAST
TECHNIQUE: Multiplanar and multiecho pulse sequences of the cervical spine, to
include the craniocervical junction and cervicothoracic junction,
were obtained without and with intravenous contrast.
CONTRAST:  8mL GADAVIST GADOBUTROL 1 MMOL/ML IV SOLN

[Series 5: T1 · sagittal · 4.0mm · 0.75mm/px · 4 of 13 slices shown (1 of 2)]
[im 1/13]
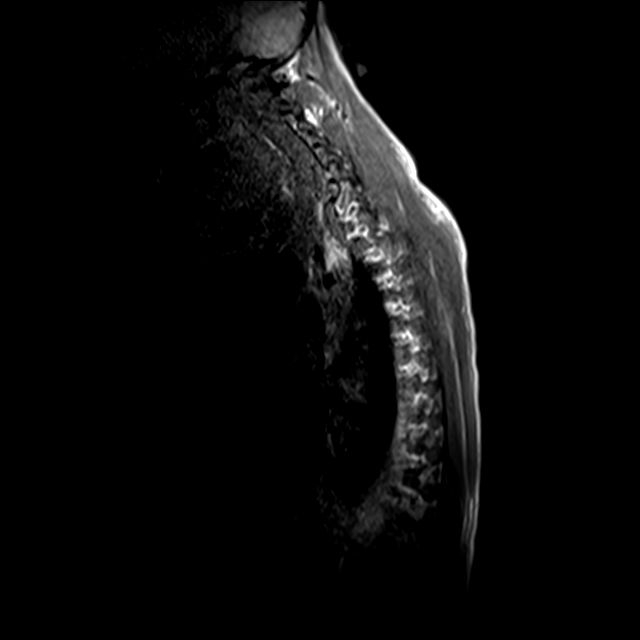
[im 5/13]
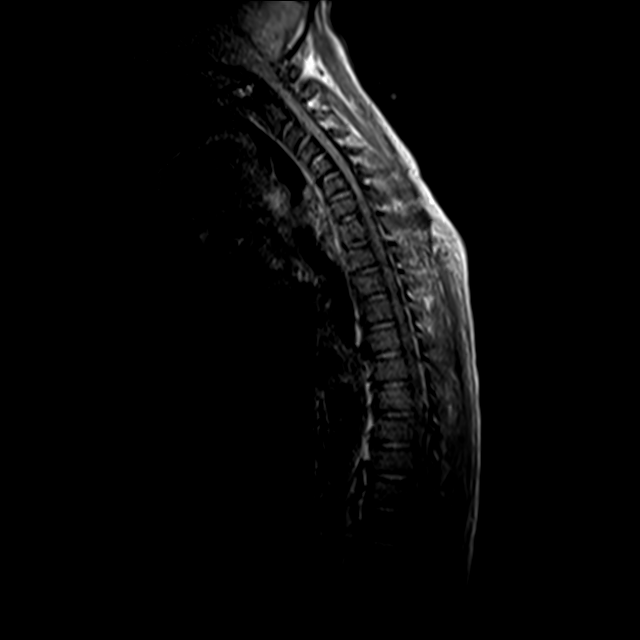
[im 9/13]
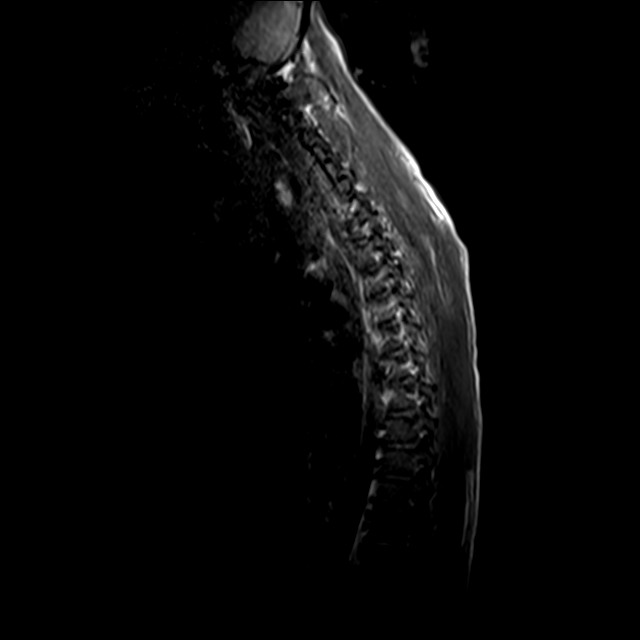
[im 13/13]
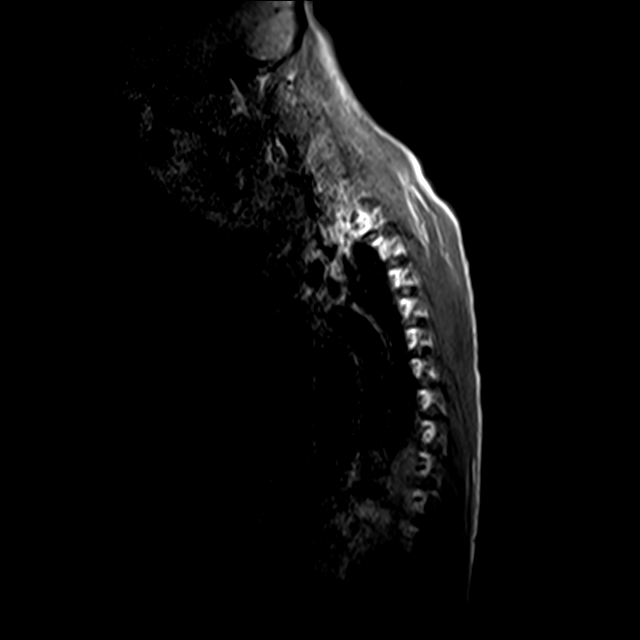

[Series 6: T1 · sagittal · 3.0mm · 0.68mm/px · 3 of 13 slices shown (2 of 2)]
[im 1/13]
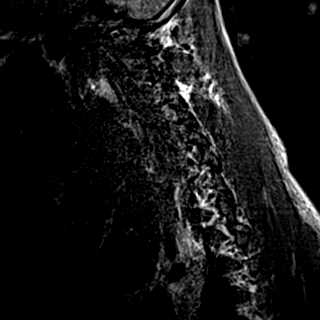
[im 7/13]
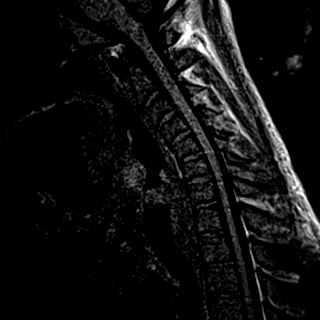
[im 13/13]
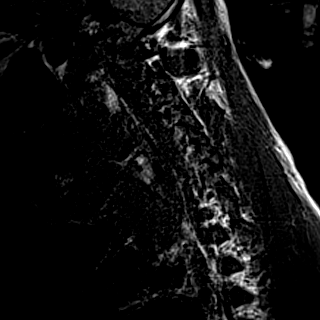

[Series 8: T2 · sagittal · 3.0mm · 0.33mm/px · 3 of 13 slices shown (1 of 2)]
[im 1/13]
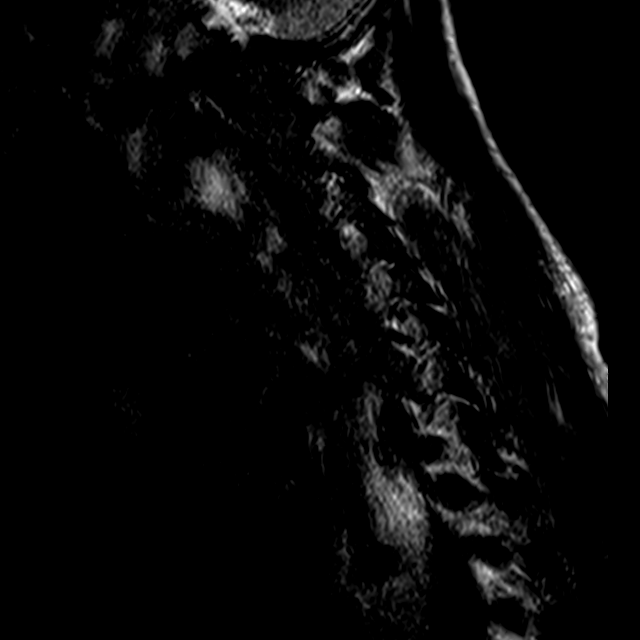
[im 7/13]
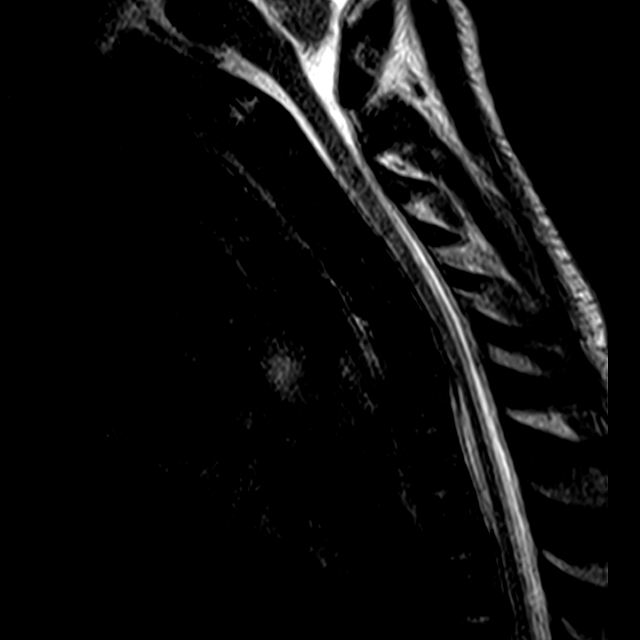
[im 13/13]
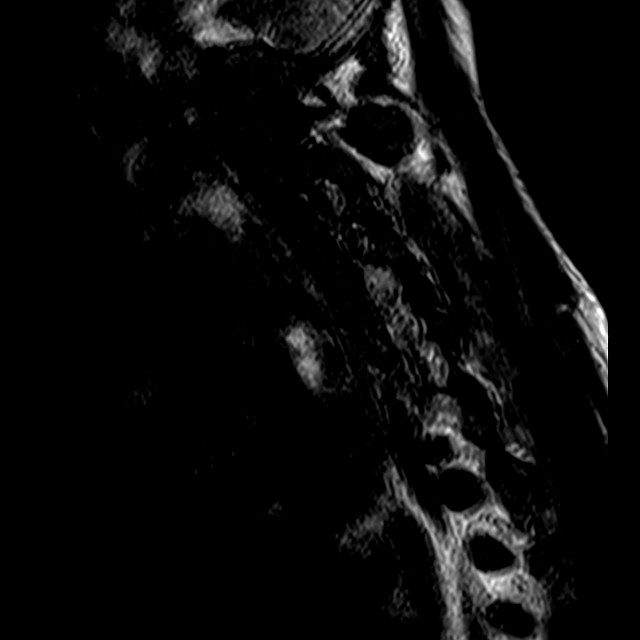

[Series 9: T2 · axial · 3.0mm · 0.27mm/px · z∈[-40,+52]mm · 8 of 33 slices shown (2 of 2)]
[im 1/33]
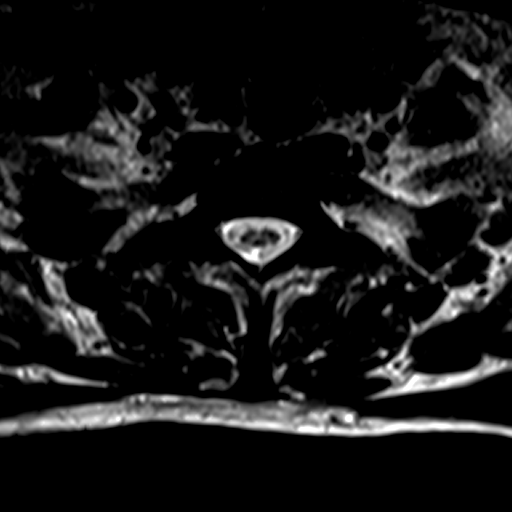
[im 5/33]
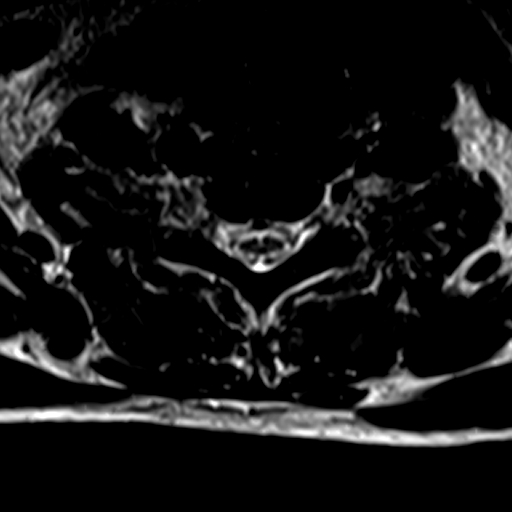
[im 10/33]
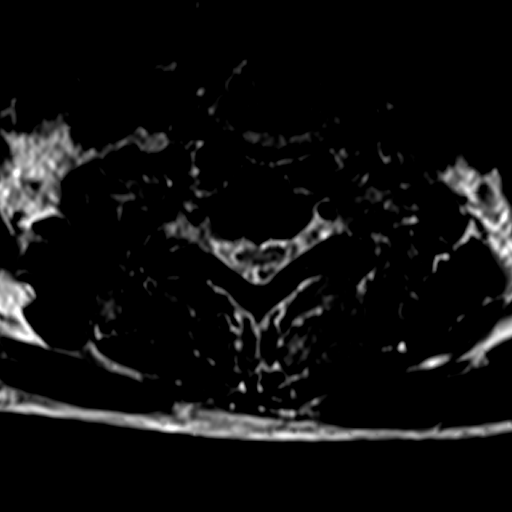
[im 14/33]
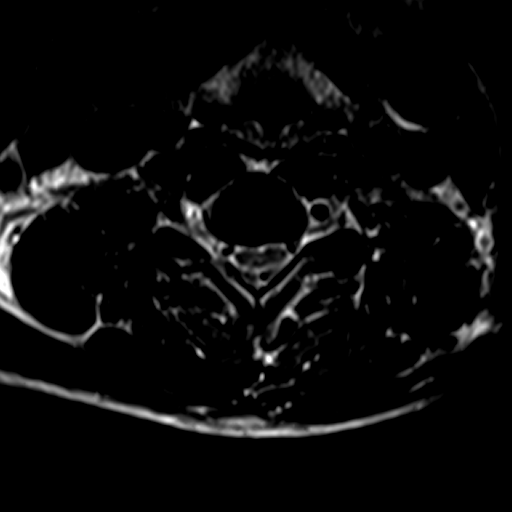
[im 19/33]
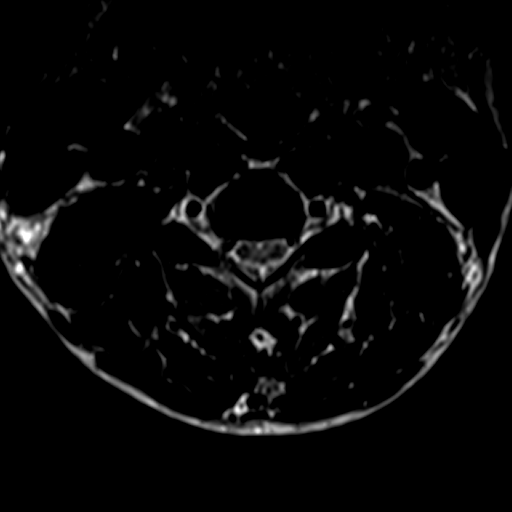
[im 23/33]
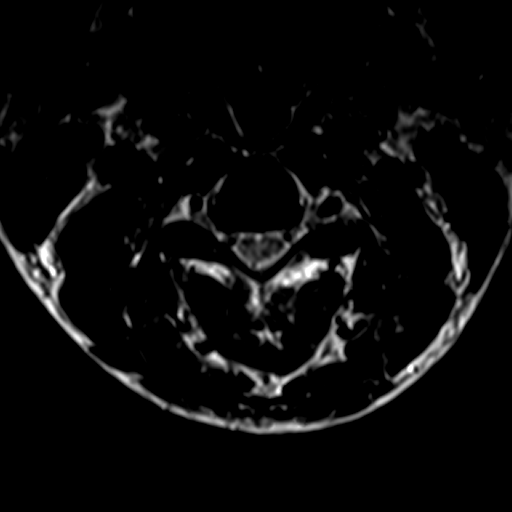
[im 28/33]
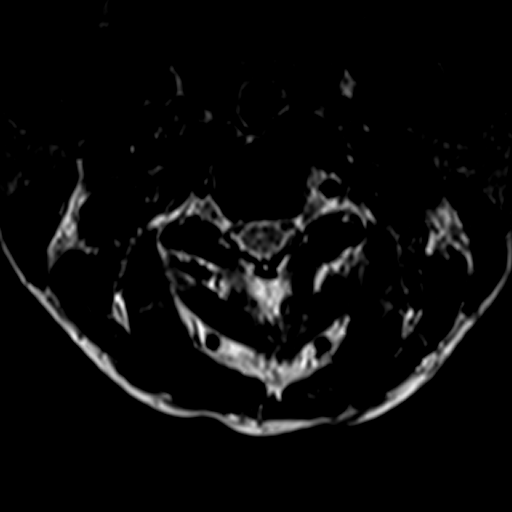
[im 33/33]
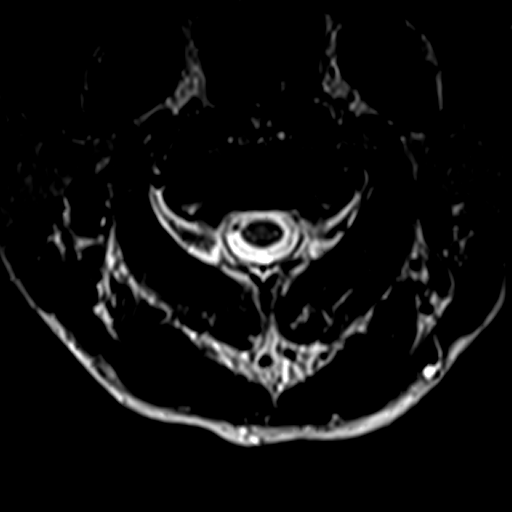

[18 of 48 positions shown; findings below may reference images not displayed]

FINDINGS: There is decreased signal and motion artifact. Findings below are
within this limitation.

Alignment: Anteroposterior alignment is maintained.

Vertebrae: Vertebral body heights are maintained. No definite marrow
edema or abnormal enhancement.

Cord: No abnormal cord signal.  No epidural collection.

Posterior Fossa, vertebral arteries, paraspinal tissues:
Unremarkable.

Disc levels: Small right paracentral protrusion at C6-C7. There is
no significant canal or neural foraminal stenosis at any level.
IMPRESSION: Suboptimal evaluation due to artifact. No evidence of
discitis/osteomyelitis or epidural collection.

## 2024-09-21 DEATH — deceased
